# Patient Record
Sex: Male | Born: 1945
Health system: Western US, Academic
[De-identification: ages and names within clinical notes are randomized; demographics above are authoritative.]

---

## 2011-06-05 DIAGNOSIS — C4490 Unspecified malignant neoplasm of skin, unspecified: Secondary | ICD-10-CM

## 2012-08-18 DIAGNOSIS — R002 Palpitations: Secondary | ICD-10-CM

## 2012-08-18 DIAGNOSIS — Z0000 Encounter for general adult medical examination without abnormal findings: Secondary | ICD-10-CM

## 2012-08-23 DIAGNOSIS — I10 Essential (primary) hypertension: Secondary | ICD-10-CM

## 2012-08-23 DIAGNOSIS — R9431 Abnormal electrocardiogram [ECG] [EKG]: Secondary | ICD-10-CM

## 2012-10-19 DEATH — deceased

## 2016-05-25 ENCOUNTER — Ambulatory Visit: Payer: BLUE CROSS/BLUE SHIELD

## 2016-05-28 ENCOUNTER — Ambulatory Visit: Payer: BLUE CROSS/BLUE SHIELD

## 2016-05-29 ENCOUNTER — Ambulatory Visit: Payer: BLUE CROSS/BLUE SHIELD

## 2016-05-29 DIAGNOSIS — N401 Enlarged prostate with lower urinary tract symptoms: Secondary | ICD-10-CM

## 2016-05-29 DIAGNOSIS — Z1211 Encounter for screening for malignant neoplasm of colon: Secondary | ICD-10-CM

## 2016-05-29 DIAGNOSIS — Z79899 Other long term (current) drug therapy: Secondary | ICD-10-CM

## 2016-05-29 DIAGNOSIS — I10 Essential (primary) hypertension: Secondary | ICD-10-CM

## 2016-05-29 DIAGNOSIS — E1165 Type 2 diabetes mellitus with hyperglycemia: Secondary | ICD-10-CM

## 2016-05-29 DIAGNOSIS — Z794 Long term (current) use of insulin: Secondary | ICD-10-CM

## 2016-05-29 DIAGNOSIS — R351 Nocturia: Secondary | ICD-10-CM

## 2016-06-20 DIAGNOSIS — E161 Other hypoglycemia: Secondary | ICD-10-CM

## 2016-07-09 MED ORDER — ONETOUCH ULTRA BLUE VI STRP
ORAL_STRIP | 3 refills | Status: AC
Start: 2016-07-09 — End: 2017-03-10

## 2016-07-09 MED ORDER — SIMVASTATIN 20 MG PO TABS
ORAL_TABLET | 0 refills | Status: AC
Start: 2016-07-09 — End: 2016-10-06

## 2016-07-20 MED ORDER — INSULIN GLARGINE 100 UNIT/ML SC SOLN
0 refills | Status: AC
Start: 2016-07-20 — End: 2017-07-05

## 2016-08-18 MED ORDER — LANTUS 100 UNIT/ML SC SOLN
0 refills
Start: 2016-08-18 — End: ?

## 2016-08-28 MED ORDER — BD INSULIN SYRINGE U/F 31G X 5/16'' 0.3 ML MISC
3 refills | Status: AC
Start: 2016-08-28 — End: 2016-09-15

## 2016-08-28 MED ORDER — ONETOUCH ULTRA BLUE VI STRP
ORAL_STRIP | 3 refills | Status: AC
Start: 2016-08-28 — End: 2017-03-10

## 2016-09-10 MED ORDER — BD INSULIN SYRINGE U/F 31G X 5/16'' 0.3 ML MISC
3 refills
Start: 2016-09-10 — End: ?

## 2016-09-14 ENCOUNTER — Telehealth: Payer: BLUE CROSS/BLUE SHIELD

## 2016-09-14 DIAGNOSIS — Z0000 Encounter for general adult medical examination without abnormal findings: Secondary | ICD-10-CM

## 2016-09-14 DIAGNOSIS — E1065 Type 1 diabetes mellitus with hyperglycemia: Secondary | ICD-10-CM

## 2016-09-15 MED ORDER — INSULIN SYRINGE-NEEDLE U-100 31G X 5/16'' 0.3 ML MISC
1 | Freq: Every day | SUBCUTANEOUS | 3 refills | Status: AC
Start: 2016-09-15 — End: 2017-10-09

## 2016-10-05 MED ORDER — ONETOUCH ULTRA BLUE VI STRP
ORAL_STRIP | 3 refills | Status: AC
Start: 2016-10-05 — End: 2017-12-24

## 2016-10-05 MED ORDER — SIMVASTATIN 20 MG PO TABS
ORAL_TABLET | 0 refills | Status: AC
Start: 2016-10-05 — End: 2017-01-05

## 2016-10-09 IMAGING — CR DX Chest PA Lateral
2 series · 2 of 2 positions shown · non-contrast
Comparison: none

Chest
CLINICAL HISTORY: Left-sided numbness and pain. Shortness of breath.

[chest pa]
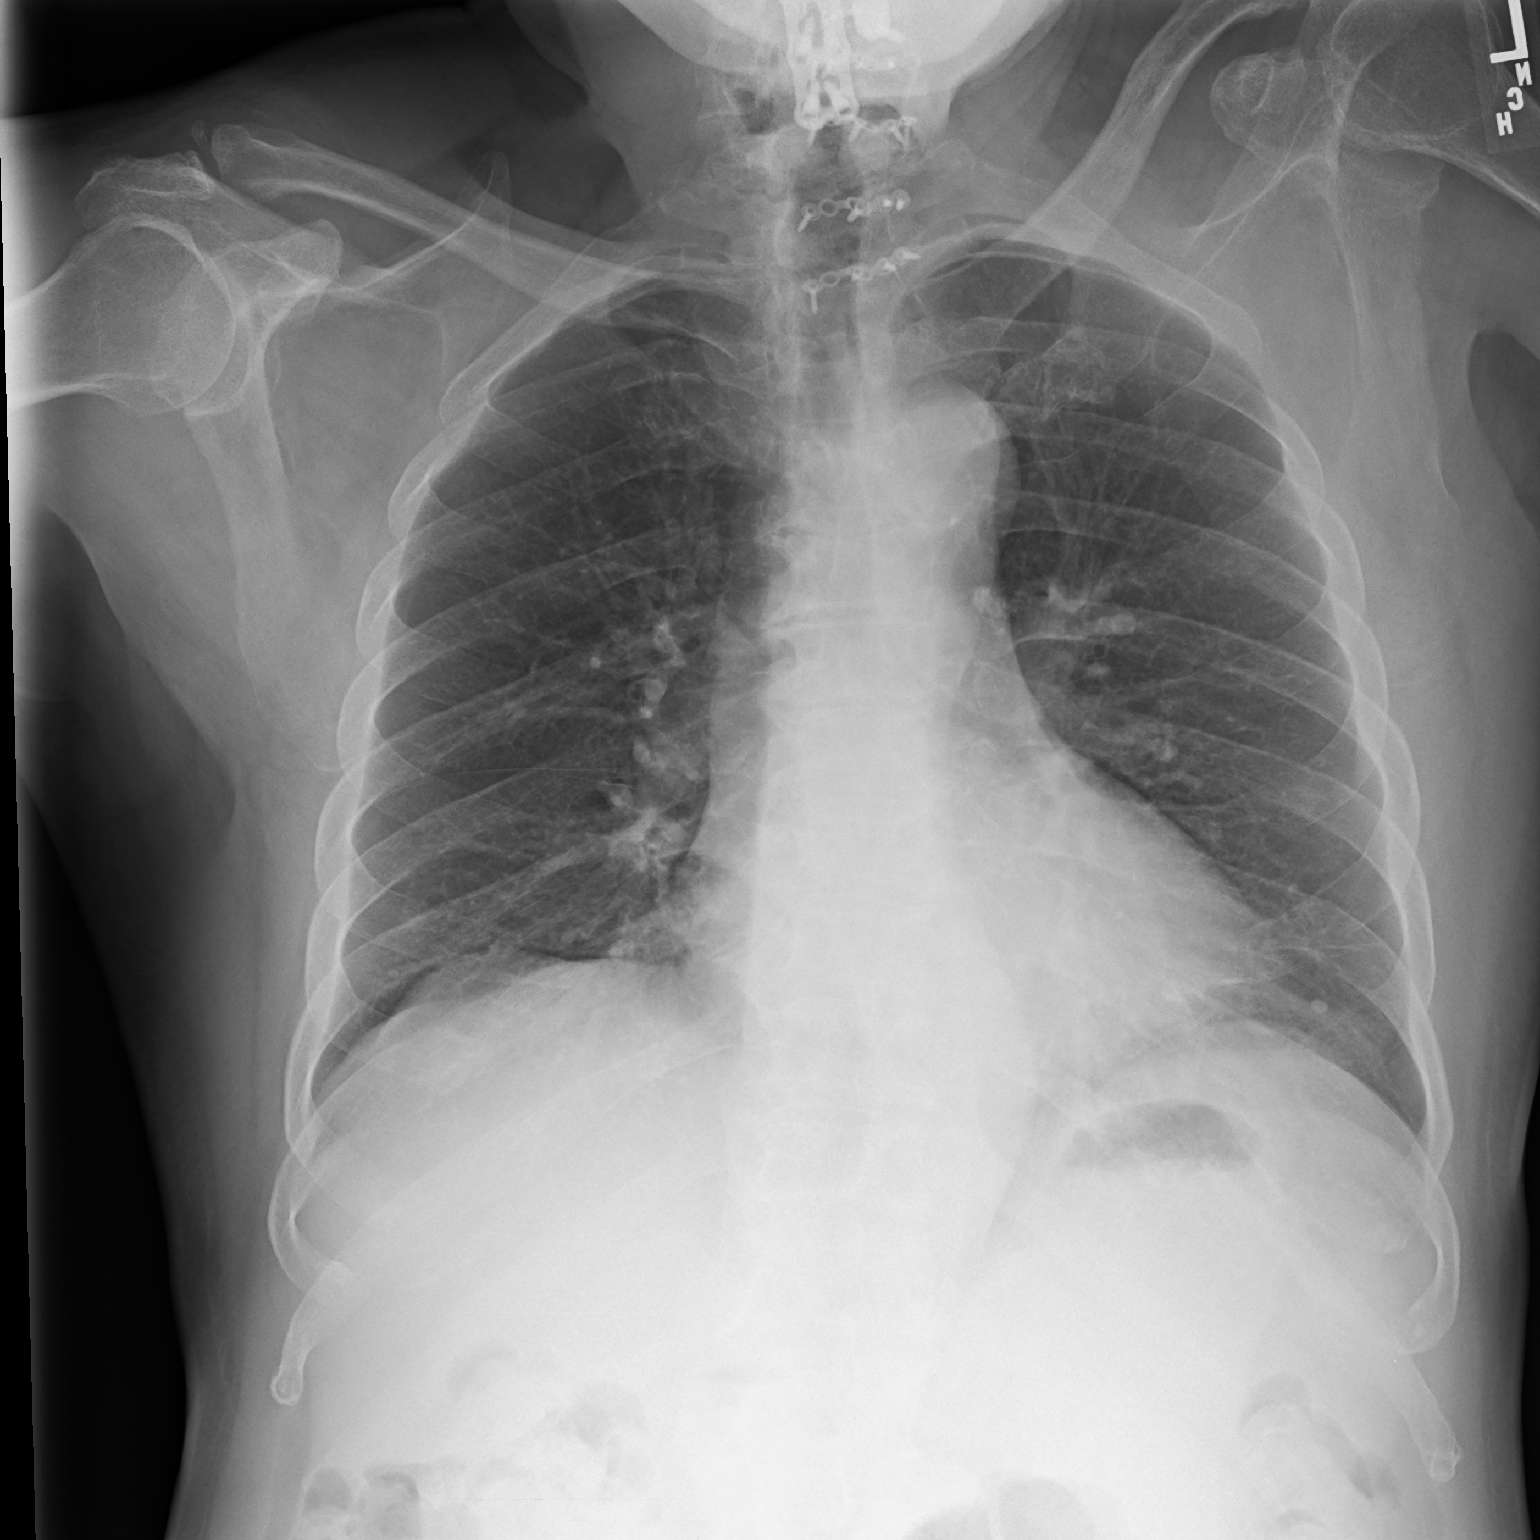

[chest lat]
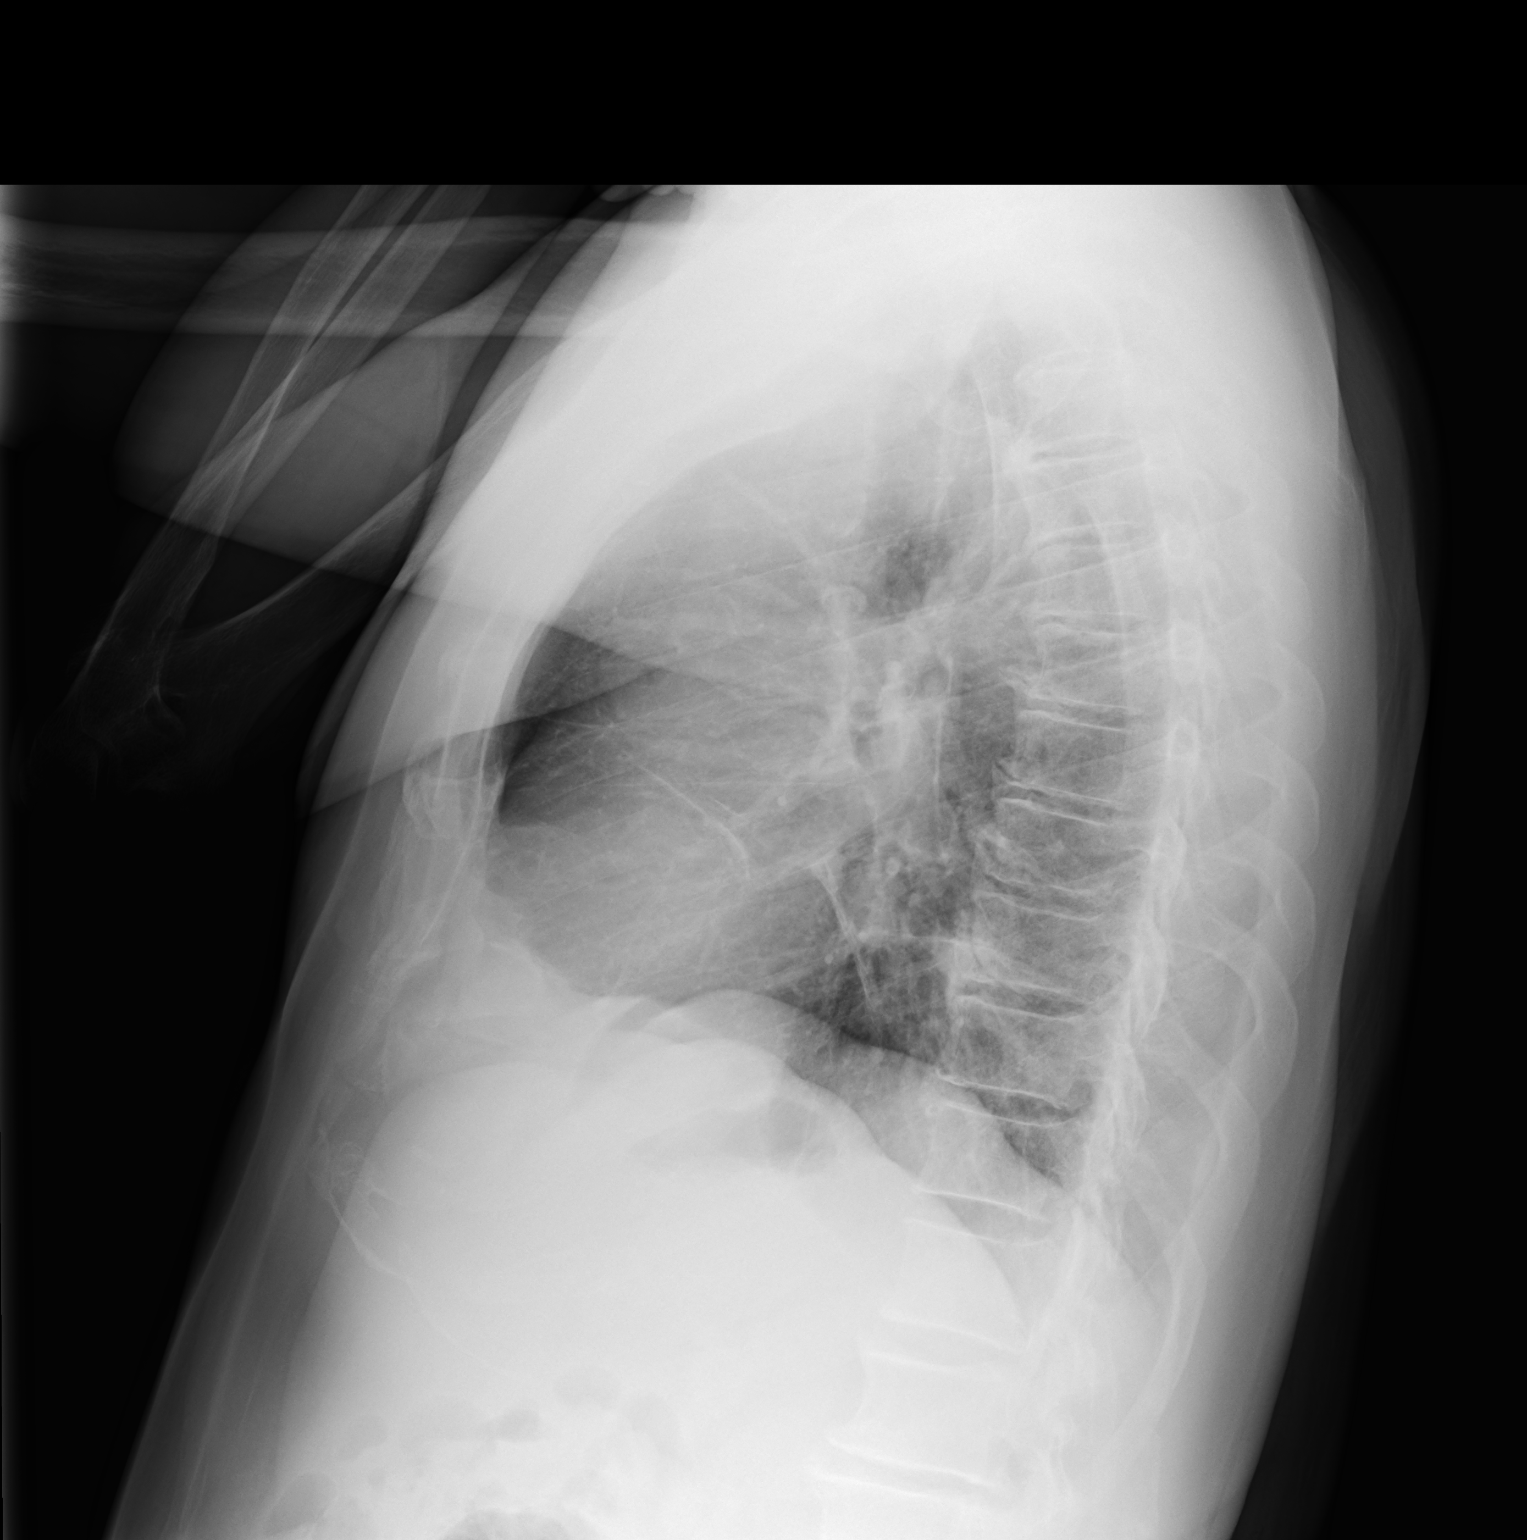

[2 of 2 positions shown; findings below may reference images not displayed]

FINDINGS: Anterior posterior plate and screw fixation lower cervical upper thoracic       
 spine.                                                                                    
 Heart size mildly enlarged. Pulmonary vascularity mediastinum within normal limits.       
 Calcified atherosclerotic disease aorta with mild tortuosity. Calcified node              
 aortopulmonary window.                                                                    
 Mild elevation and eventration right hemidiaphragm.                                       
 No airspace disease, effusions or pneumothorax.                                           
 Atelectasis/scarring left base. Probable left basilar granuloma.                          
 Degenerative changes spine and shoulders.                                                 
 IMPRESSIONS:                                                                              
 1. Mild cardiomegaly.                                                                     
 2. No acute disease.

## 2016-11-18 MED ORDER — SILDENAFIL CITRATE 100 MG PO TABS
100 mg | ORAL_TABLET | ORAL | 4 refills | Status: AC | PRN
Start: 2016-11-18 — End: 2018-01-28

## 2016-12-24 ENCOUNTER — Ambulatory Visit: Payer: BLUE CROSS/BLUE SHIELD

## 2016-12-24 DIAGNOSIS — R252 Cramp and spasm: Secondary | ICD-10-CM

## 2016-12-24 DIAGNOSIS — E1165 Type 2 diabetes mellitus with hyperglycemia: Secondary | ICD-10-CM

## 2016-12-24 DIAGNOSIS — R05 Cough: Secondary | ICD-10-CM

## 2016-12-25 LAB — Basic Metabolic Panel
CALCIUM: 9.6 mg/dL (ref 8.6–10.4)
TOTAL CO2: 24 mmol/L (ref 20–30)

## 2016-12-25 LAB — CK, Total: CREATINE PHOSPHOKINASE, TOTAL: 369 U/L (ref 63–473)

## 2016-12-25 LAB — Hgb A1c: HGB A1C - HPLC: 9.1 — ABNORMAL HIGH (ref <5.7)

## 2016-12-25 LAB — Hepatic Funct Panel
BILIRUBIN,CONJUGATED: 0.2 mg/dL (ref <=0.3)
TOTAL PROTEIN: 7.8 g/dL (ref 6.1–8.2)

## 2016-12-25 LAB — Lipase: LIPASE: 16 U/L (ref 9–63)

## 2016-12-25 LAB — Phosphorus: PHOSPHORUS: 3.4 mg/dL (ref 2.3–4.4)

## 2016-12-25 LAB — Extra Lavender Top

## 2016-12-25 LAB — Magnesium: MAGNESIUM: 1.6 meq/L (ref 1.4–1.9)

## 2016-12-25 LAB — Amylase: AMYLASE: 92 U/L (ref 31–124)

## 2016-12-30 MED ORDER — LISINOPRIL 20 MG PO TABS
ORAL_TABLET | 3 refills | Status: AC
Start: 2016-12-30 — End: 2017-11-05

## 2017-01-04 ENCOUNTER — Ambulatory Visit: Payer: BLUE CROSS/BLUE SHIELD

## 2017-01-04 ENCOUNTER — Telehealth: Payer: BLUE CROSS/BLUE SHIELD

## 2017-01-04 ENCOUNTER — Inpatient Hospital Stay: Payer: BLUE CROSS/BLUE SHIELD

## 2017-01-04 DIAGNOSIS — S99922A Unspecified injury of left foot, initial encounter: Secondary | ICD-10-CM

## 2017-01-04 MED ORDER — SIMVASTATIN 20 MG PO TABS
ORAL_TABLET | 0 refills | Status: AC
Start: 2017-01-04 — End: 2017-03-10

## 2017-01-04 NOTE — Telephone Encounter
Patient would like to know when he should return to work and if any medication will be sent to the pharmacy. Please advise. Patient was seen today.

## 2017-01-04 NOTE — Telephone Encounter
Per Dr. Meda Coffee, patient can take advil or Tylenol. In regards to returning back to work, Dr. Meda Coffee stated he wil wait for the xr results.

## 2017-01-05 DIAGNOSIS — S82402A Unspecified fracture of shaft of left fibula, initial encounter for closed fracture: Secondary | ICD-10-CM

## 2017-01-07 ENCOUNTER — Inpatient Hospital Stay: Payer: BLUE CROSS/BLUE SHIELD | Attending: Orthopaedic Surgery of the Spine

## 2017-01-07 ENCOUNTER — Ambulatory Visit: Payer: BLUE CROSS/BLUE SHIELD | Attending: Orthopaedic Surgery of the Spine

## 2017-01-07 DIAGNOSIS — M25572 Pain in left ankle and joints of left foot: Secondary | ICD-10-CM

## 2017-01-07 NOTE — Progress Notes
Encounter Diagnoses   Name Primary?    Injury of left foot, initial encounter Yes    Closed fracture of shaft of left fibula, unspecified fracture morphology, initial encounter

## 2017-01-07 NOTE — Consults
Chief Complaint: left ankle pain    History of Present Illness:    Alpha Mysliwiec is a very pleasant 71 y.o. patient who presents with a 1 week history of left ankle pain. He was on a ladder and fell about 4 feel and felt immediate pain. He presented to clinic today without any assistance ambulating. He has minimal bruising and swelling.      Past Medical History: No past medical history on file.      Medications:   Outpatient Medications Prior to Visit   Medication Sig   ??? CIALIS 5 MG tablet TAKE 1 TABLET (5 MG TOTAL) BY MOUTH DAILY.   ??? ciprofloxacin 0.3% ophthalmic solution 1 drop qid both eyes.   ??? glucose blood test strip Use as instructed   ??? insulin glargine (LANTUS) 100 units/mL injection vial INJECT 0.2 MLS (20 UNITS TOTAL) UNDER THE SKIN AT BEDTIME.Marland Kitchen   ??? insulin lispro, Humalog, (HUMALOG) 100 unit/mL injection vial 6 units tid before meals.   ??? insulin syringe needle U-100 (BD INSULIN SYRINGE ULTRAFINE) 31G X 5/16'' 0.3 mL syringe Inject 1 each under the skin five (5) times daily.   ??? lisinopril 20 mg tablet TAKE 1 TABLET (20 MG TOTAL) BY MOUTH DAILY..   ??? LISINOPRIL 20 mg tablet TAKE 1 TABLET (20 MG TOTAL) BY MOUTH DAILY..   ??? ONETOUCH ULTRA BLUE test strip USE 1 TEST STRIP TESTING 5 TIMES A DAY.   ??? ONETOUCH ULTRA BLUE test strip USE 1 TEST STRIP TESTING 5 TIMES A DAY.   ??? ONETOUCH ULTRA BLUE test strip USE 1 TEST STRIP TESTING 5 TIMES A DAY.   ??? SILDENAFIL 100 mg tablet TAKE 1 TABLET (100 MG TOTAL) BY MOUTH AS NEEDED FOR FOR ERECTILE DYSFUNCTION.   ??? simvastatin (ZOCOR) 20 mg tablet TOME UNA TABLETA DIARIAMENTE   ??? SIMVASTATIN 20 mg tablet TAKE 1 TABLET (20 MG TOTAL) BY MOUTH AT BEDTIME.   ??? SIMVASTATIN 20 mg tablet TOME UNA TABLETA TODOS LOS DIAS AL ACOSTARSE   ??? VIAGRA 100 MG tablet TAKE AS DIRECTED   ??? VIAGRA 100 MG tablet TAKE AS DIRECTED   ??? VIAGRA 100 MG tablet TAKE AS DIRECTED   ??? VIAGRA 100 MG tablet TAKE 1 TABLET (100 MG TOTAL) BY MOUTH AS NEEDED FOR FOR ERECTILE DYSFUNCTION. No facility-administered medications prior to visit.          Review of Systems: Non Contributory    Physical Exam:    GENERAL: Well-developed, well-nourished.  Appropriate affect and orientation.    GAIT: Antalgic gait.  Deferred heel and toe walking.    HINDFOOT STRUCTURE: Normal heel position.    ARCH STRUCTURE: Symmetric arch structure.    FOREFOOT STRUCTURE: Normal forefoot.    RANGE OF MOTION: Full and symmetric ankle, hindfoot and toe range of motion     NEUROVASCULAR: Intact pulses and sensation.    MOTOR: Intact strength.    TENDERNESS: Tenderness to palpation over fracture site fibula.  No midfoot or proximal fibular tenderness.      RADIOGRAPHS:    XR ANKLE AP LAT OBL LEFT 3V, XR FOOT AP LAT OBL LEFT 3V  ???  CLINICAL HISTORY: ankle pain.  ???  COMPARISON: None.  ???  ???  IMPRESSION:  ???  Ankle: There is a minimally displaced Weber B fracture of the distal fibula shaft. The ankle mortise is intact. There is soft tissue swelling, with a joint effusion.  ???  Foot: Normal study. There is no acute fracture.  Joint spaces are maintained.    ASSESSMENT:  Minimally displaced Weber B fracture of the left distal fibula shaft with stable mortise on weightbearing.    PLAN:    We discussed non operative management. He will be placed in a boot. He will obtain a new x ray of the left ankle at 8 weeks postinjury      Signature     Scribe Attestation  I, Lamonte Sakai, was the scribe for patient Chad Watkins  on 7:23 AM 01/07/2017 in the presence of Dr. Moise Boring. Kamran Coker.    Physician Signature:  I, Dr. Michela Pitcher, personally performed the services described in this documentation, as scribed by Lamonte Sakai in my presence, and it is both accurate and complete.    Moise Boring. Aliveah Gallant, MD

## 2017-01-07 NOTE — Progress Notes
DATE OF SERVICE:  01/04/2017    ATTENDING PHYSICIAN:  Chere Babson G. Jissell Trafton, MD [phone])      SUBJECTIVE:  Patient here after a fall yesterday. As a result, he has painful foot and painful ankle.    OBJECTIVE:  There is mild swelling of the ankle in the dorsum of the foot. No vascular insufficiency, no neuro insufficiency.    PLAN:  X-ray of the foot and the ankle and refer to Ortho.    ASSESSMENT:  Injury of left foot, initial encounter. Closed fracture of the shaft of the left fibula, unspecified fracture, initial encounter.      Turon Kilmer G. Meda Coffee, MD [phone])        [facility] CONF#: [id number]  D: 01/07/2017 06:31:38 T: 01/07/2017 08:48:58 DOCUMENT: [id number]

## 2017-01-08 MED ORDER — HUMALOG 100 UNIT/ML SC SOLN
0 refills
Start: 2017-01-08 — End: ?

## 2017-01-11 DIAGNOSIS — R1013 Epigastric pain: Secondary | ICD-10-CM

## 2017-01-12 NOTE — Progress Notes
Encounter Diagnoses   Name Primary?    Abdominal discomfort, epigastric Yes    Cough     Cramp of extremity     Diabetes mellitus type 2, uncontrolled, without complications ([facility])

## 2017-01-13 NOTE — Progress Notes
DATE OF SERVICE:  12/24/2016     SUBJECTIVE:  Patient here with multiple problems, #1 cough. The patient has been coughing, but nonproductive cough, with no fever and no chills. He has been having epigastric pain and nausea, especially before eating or after eating, not related to any special food. No hematemesis, no melena. Also been having some cramps of the lower extremity, and he wants to check on his diabetes. Otherwise systemic review, including 14-point review of systems, all unremarkable.    OBJECTIVE:  Vital Signs: Blood pressure is 127/64. Neck: No JVD. No carotid bruit. Lungs: Clear. Heart: Regular sinus rhythm. Abdomen: Soft. No tenderness. No rebound.    PLAN:  Patient will have blood chemistry, serum lipase. Can use Protonix, or Prilosec, or Zantac over the counter for his cough. We will consider lisinopril as a cause of the cough or viral infection. Observation, reassurance, and we will follow up his diabetes.    ASSESSMENT:  1. Abdominal discomfort epigastric.  2. Cough.  3. Cramp of lower extremities.  4. Diabetes mellitus type 2, under control, without mild complication.      Ivory Maduro G. Meda Coffee, MD [phone])        [facility] CONF#: [id number]  D: 01/13/2017 03:47:31 T: 01/13/2017 04:07:58 DOCUMENT: [id number]

## 2017-01-14 ENCOUNTER — Telehealth: Payer: BLUE CROSS/BLUE SHIELD

## 2017-01-14 NOTE — Telephone Encounter
EDD (disability) paperwork was drop off by patient's daughter Wells Guiles. Informed paperwork will be placed at Dr. Meda Coffee' inbox. Patient will be notify once completed.

## 2017-01-22 ENCOUNTER — Ambulatory Visit: Payer: BLUE CROSS/BLUE SHIELD

## 2017-01-23 DIAGNOSIS — S82402D Unspecified fracture of shaft of left fibula, subsequent encounter for closed fracture with routine healing: Secondary | ICD-10-CM

## 2017-01-23 NOTE — Progress Notes
DATE OF SERVICE:  01/22/2017     CHIEF COMPLAINT:  Patient here for followup on fracture, left fibula. He has an appointment in 4 weeks with his orthopedist. Patient here to fill the papers for disability and for EDD. Patient has no complaints.    OBJECTIVE:  The left leg is not swollen. No edema. No sign of DVT.    PLAN:  We did discuss the above. We did discuss deep vein thrombosis prevention. Patient was advised to go to the emergency room if there is any shortness of breath. We did discuss deep vein thrombosis. His wife was present. Also, did discuss patient to be on calcium and vitamin D3, consider physical therapy after the healing, and consider bone density scan. We did discuss peripheral neuropathy, osteoporosis, osteopenia, and gait imbalance, although the patient did injure himself while working in the yard.    ASSESSMENT:  1. Closed fracture of shaft of left fibula.  Routine healing.   2. Possible osteopenia or osteoporosis.      Deniese Oberry G. Meda Coffee, MD [phone])        [facility] CONF#: [id number]  D: 01/23/2017 07:22:44 T: 01/23/2017 07:31:50 DOCUMENT: [id number]

## 2017-01-23 NOTE — Progress Notes
Encounter Diagnosis   Name Primary?    Closed fracture of shaft of left fibula with routine healing, unspecified fracture morphology, subsequent encounter Yes

## 2017-01-25 NOTE — Telephone Encounter
Work form from [employer] has been completed and patient informed. Form will be at the front desk.

## 2017-03-09 ENCOUNTER — Inpatient Hospital Stay: Payer: BLUE CROSS/BLUE SHIELD | Attending: Orthopaedic Surgery of the Spine

## 2017-03-09 ENCOUNTER — Ambulatory Visit: Payer: BLUE CROSS/BLUE SHIELD | Attending: Orthopaedic Surgery of the Spine

## 2017-03-09 DIAGNOSIS — M25572 Pain in left ankle and joints of left foot: Secondary | ICD-10-CM

## 2017-03-09 NOTE — Progress Notes
History:     Chad Watkins returns for follow-up for 2 month history of left ankle pain. He was on a ladder and fell about 4 feet and felt immediate pain. He has been in a boot since his last visit.    Physical Examination:    Minimal swelling and TTP over fracture site.  Good retained ankle range of motion    Radiographs:    Three views of the left ankle demonstrate progressively healing fracture with callus formation, stable mortise.    Assessment:     Minimally displaced Weber B fracture of the left distal fibula shaft with stable mortise on weightbearing.    Plan:    I wrote for physical therapy and a brace. He can wean the boot and advance activity as tolerated. Follow up in 1 month with x rays of the left ankle.      Signature     Scribe Attestation  I, Lamonte Sakai, was the scribe for patient Chad Watkins  on 7:43 AM 03/09/2017 in the presence of Dr. Moise Boring. Maddyx Wieck.    Physician Signature:  I, Dr. Michela Pitcher, personally performed the services described in this documentation, as scribed by Lamonte Sakai in my presence, and it is both accurate and complete.    Moise Boring. Caedence Snowden, MD

## 2017-03-30 ENCOUNTER — Ambulatory Visit: Payer: BLUE CROSS/BLUE SHIELD

## 2017-04-01 ENCOUNTER — Ambulatory Visit: Payer: BLUE CROSS/BLUE SHIELD

## 2017-04-05 DIAGNOSIS — M25572 Pain in left ankle and joints of left foot: Secondary | ICD-10-CM

## 2017-04-05 DIAGNOSIS — G8929 Other chronic pain: Secondary | ICD-10-CM

## 2017-04-05 MED ORDER — SIMVASTATIN 20 MG PO TABS
ORAL_TABLET | 0 refills | Status: AC
Start: 2017-04-05 — End: 2017-06-30

## 2017-04-05 NOTE — Progress Notes
DATE OF SERVICE:  04/01/2017     SUBJECTIVE:  Patient here for chronic pain of left ankle, unable to walk normally or stand on his feet for a long time. The patient has been under the care of orthopedist for open reduction and internal fixation from the fracture of the ankle. The patient wants to fill his paper for extended disability.    OBJECTIVE:  The left ankle is mildly swollen. No sign of phlebitis.    PLAN:  The patient will extend his physical disability and we will fill the paper after he sees the orthopedist and he will he will be seen soon. The patient will extend his disability beyond the 04/06/2017 recommended by the orthopedist.    ASSESSMENT:  1. Chronic left ankle pain.   2. Status post fracture left ankle.      Renae Mottley G. Meda Coffee, MD [phone])        [facility] CONF#: [id number]  D: 04/05/2017 09:44:40 T: 04/05/2017 09:57:51 DOCUMENT: [id number]

## 2017-04-05 NOTE — Progress Notes
Encounter Diagnosis   Name Primary?    Chronic pain of left ankle Yes

## 2017-04-06 ENCOUNTER — Inpatient Hospital Stay: Payer: BLUE CROSS/BLUE SHIELD | Attending: Orthopaedic Surgery of the Spine

## 2017-04-06 ENCOUNTER — Ambulatory Visit: Payer: BLUE CROSS/BLUE SHIELD | Attending: Orthopaedic Surgery of the Spine

## 2017-04-06 DIAGNOSIS — S82892S Other fracture of left lower leg, sequela: Secondary | ICD-10-CM

## 2017-04-06 DIAGNOSIS — M25572 Pain in left ankle and joints of left foot: Secondary | ICD-10-CM

## 2017-04-06 NOTE — Progress Notes
History:     Braylon Grenda returns for follow-up for 3 monthhistory of left anklepain. He was on a ladder and fell about 4 feet and felt immediate pain. He has been in a normal shoe since his last visit.    Physical Examination:    Minimal swelling and TTP over fracture site.  Good retained ankle range of motion    Radiographs:    Three views of the left ankle demonstrate progressively healing fracture with callus formation, stable mortise.    Assessment:     Minimally displaced Weber B fracture of the left distal fibula shaft with stable mortise on weightbearing.    Plan:    Advance activity as tolerated. Follow up as needed.       Signature     Scribe Attestation  I, Sterling Big, was the scribe for patient Nakota Elsen  on 5:00 PM 04/05/2017 in the presence of Dr. Assunta Gambles. [place].    Physician Signature:  I, Dr. Tish Frederickson, personally performed the services described in this documentation, as scribed by Sterling Big in my presence, and it is both accurate and complete.    Assunta Gambles. [place], MD***

## 2017-04-08 NOTE — Progress Notes
PHYSICAL THERAPY EVALUATION     PATIENT: Chad Watkins  GENDER: male  AGE: 72 y.o.  DOB: [birth year]  MRN: [id number]    REFERRING PHYSICIAN: Isabel Caprice., MD    DATE OF ONSET: 01/03/17    DIAGNOSIS:  Encounter Diagnoses   Name Primary?   ??? Acute left ankle pain        Subjective:   CHIEF COMPLAINT:  Chief Complaint   Patient presents with   ??? Pain       HISTORY OF PRESENT ILLNESS / INJURY: InterpretorEnid Derry [phone] used:   Patient is a 72 year-old right/left handed male, referred to physical therapy for with complaints of left ankle pain, secondary to injury when patient fell on a ladder in January 03, 2017 resulting in a closed fracture of the left distal fibula shaft. Patient wore a CAM boot for 1.5 week and wore a brace for 1 month afterward and is now currently walking without support as of a few days go. Patient experiences pain at the end of the day with ambulation. Patient is able to walk for 1 block with some discomfort but no pain. Increased discomfort with stairs and experiences some swelling. He lives in a 2 story home and has to use the stairs (15 steps) on a daily basis. Some discomfort with sleeping as he cant place his foot over the other foot.      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 Patient is able to walk for 1 block with some discomfort but no pain. Increased discomfort with stairs and experiences some swelling. He lives in a 2 story home and has to use the stairs (15 steps)  on a daily basis. Some discomfort with sleeping as he cant place his foot over the other foot.    PAIN:   Location: Left Ankle (medial and lateral) and at arch  Duration: Variable  Description: unable to describe   Current Pain: 4/10  Pain at Best: 0/10 Pain at Worst: 6/10  Aggravating Factors: walking, stairs, standing  Alleviating Factors: Heat      PRIOR INTERVENTIONS:  None    BARRIERS TO LEARNING:  Are there any cultural or religious beliefs limiting treatment?: No      Are there any barriers to learning?: No     Are there any special communication needs?: Yes  If yes, comments:: needs Spanish translator            History:   PROBLEM SUMMARY LIST:   Patient Active Problem List   Diagnosis   ??? Bursitis   ??? Skin cancer   ??? Hyperlipidemia   ??? Palpitation   ??? Need for vaccination   ??? Type 2 diabetes mellitus (HCC/RAF)   ??? Visit for preventive health examination   ??? Hypertension, well controlled   ??? Abnormal EKG   ??? Diabetes mellitus, insulin dependent (IDDM), uncontrolled (HCC/RAF)   ??? Diabetes mellitus type I (HCC/RAF)   ??? Left ankle pain       SURGICAL HISTORY:  No past surgical history on file.     PERTINENT DIAGNOSTIC TESTS: 04/06/17 XR ankle: ''IMPRESSION: The Weber B fracture shows stable near-anatomic alignment. There is progressive  healing. The ankle mortise is normal.''    01/04/17 XR ankle: ''IMPRESSION:  ???  Ankle: There is a minimally displaced Weber B fracture of the distal fibula shaft. The ankle mortise is intact. There is soft tissue swelling, with a joint effusion.  ???  Foot: Normal study. There is no acute fracture. Joint spaces are maintained.''???  ???    MEDICATIONS:   Outpatient Medications Prior to Visit   Medication Sig   ??? acetaminophen-codeine (TYLENOL #3) 300-30 mg tablet TAKE 1 TABLET EVERY 4 HOURS AS NEEDED FOR PAIN   ??? CIALIS 5 MG tablet TAKE 1 TABLET (5 MG TOTAL) BY MOUTH DAILY.   ??? ciprofloxacin 0.3% ophthalmic solution 1 drop qid both eyes.   ??? glucose blood test strip Use as instructed   ??? insulin glargine (LANTUS) 100 units/mL injection vial INJECT 0.2 MLS (20 UNITS TOTAL) UNDER THE SKIN AT BEDTIME.Marland Kitchen   ??? insulin lispro, Humalog, (HUMALOG) 100 unit/mL injection vial 6 units tid before meals. ??? insulin syringe needle U-100 (BD INSULIN SYRINGE ULTRAFINE) 31G X 5/16'' 0.3 mL syringe Inject 1 each under the skin five (5) times daily.   ??? LISINOPRIL 20 mg tablet TAKE 1 TABLET (20 MG TOTAL) BY MOUTH DAILY..   ??? ONETOUCH ULTRA BLUE test strip USE 1 TEST STRIP TESTING 5 TIMES A DAY.   ??? penicillin V potassium 500 mg tablet TAKE 1 TABLET 4 TIMES ADAY UNTIL GONE   ??? SILDENAFIL 100 mg tablet TAKE 1 TABLET (100 MG TOTAL) BY MOUTH AS NEEDED FOR FOR ERECTILE DYSFUNCTION.   ??? simvastatin (ZOCOR) 20 mg tablet TOME UNA TABLETA DIARIAMENTE   ??? SIMVASTATIN 20 mg tablet TOME UNA TABLETA TODOS LOS DIAS AL ACOSTARSE   ??? VIAGRA 100 MG tablet TAKE AS DIRECTED   ??? VIAGRA 100 MG tablet TAKE 1 TABLET (100 MG TOTAL) BY MOUTH AS NEEDED FOR FOR ERECTILE DYSFUNCTION.     No facility-administered medications prior to visit.         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[employer], advertising    LIVING SITUATION:  Lives with another/others who can provide assistance - 2 story home with 15 steps    PATIENT OWNED EQUIPMENT:  None    Objective:     POSTURE/OBSERVATION - sway back posture  Cervical: forward head with rounded shoulders  Lumbar: lordosis  Pelvis: posterior pelvic tilt  Femur: slight medial rotation  Knee: slight knee flexion  Ankle/Foot: increased swelling in left ankle with weight shifted forward and more on right    FUNCTIONAL MOVEMENT ANALYSIS:  Sit-stand: increased weightshift to the right with slight femoral adduction    Single limb stance:   - Right: Ipsilateral trunk lean with femoral internal rotation - 14 seconds with increased postural sway - Left: compensates with lateral trunk lean - 0 seconds     Gait Analysis: Antalgic gait with decreased stance time on his left. Decreased heel rise and hip extension during terminal stance.  Increased pronation during stance phase.  Squat: Increased pain - quadriceps dominant and hip adduction       SITTING:  Active knee extension - posterior pelvic tilt     ACTIVE RANGE OF MOTION:  Ankle    Ankle plantarflexion (45???): Right: 35???, Left: 30???    Ankle dorsiflexion (15???): Right: 0???, Left: -5???    Ankle inversion (35???): Right: 30???, Left: 25???    Ankle eversion (25???): Right: 15???, Left: 10???    Knee    Knee extension (0???): Right: 0???, Left: 0???    Hip    Hip flexion (125???): Right: 100???, Left: 100???      MANUAL MUSCLE TESTING:  Gluteus Maximus (L5, S1):  Right: 4-/5, Left: 4-/5  Gluteus Medius (L4, L5):  Right: 4/5, Left: 4/5  Hip flexors (L1, L2):  Right: 4/5, Left: 4/5  Quadriceps (L3, L4): Right: 5/5, Left: 5/5  Hamstrings (S1): Right: 5/5, Left: 5/5  Tibialis anterior (L4, L5): Right: 5/5, Left: 4+/5  Tibialis posterior (L4, L5): Right: 5/5, Left: 4+/5  Peroneals (L5, S1): Right: 5/5, Left: 4+/5  Gastrocnemius/soleus (S1, S2): Right: 4/5, Left: 3+/5    PALPATION:  anterior talofibular ligament, anterior tibiofibular syndesmosis, calcaneofibular ligament, peroneus brevis, peroneus longus and posterior talofibular ligament    JOINT ACCESSORY MOTION: deferred testing secondary to status post fracture       PROBLEM LIST:  decreased ankle range of motion, limited standing tolerance, limited walking tolerance, pain limiting function and limited work/activity participation  pain, poor posture, gait deviations, poor balance, decreased pain-free range of motion, decreased joint accessory motion, poor pelvic/trunk stability and strength,  decreased muscle flexibility, muscle atrophy, myofascial restrictions, connective tissue mobility deficits, tenderness to palpation, limited tolerance to daily and self-care activities, and lack of patient knowledge in home exercise program        Outcome Measure(s):       LEFS Score % Disability: 44.74                                             GOALS:    SHORT TERM GOALS: 6 weeks  Patient will increase ankle range of motion by 5 degrees in dorsiflexion and plantarflexion to reduce gait deviations and improve pain management during ambulation.    Patient will improve edema management and pain level to 2/10 in order to increase standing and walking tolerance to 60 minutes.    Patient will be independent in management of pain with home program including posture correction, use of support, exercise, application of cold pack, application of heat pack and pacing of activities.      LONG TERM GOALS: 16 weeks  Patient will increase standing tolerance to 120 minutes to be able to return to work.    Patient will increase walking tolerance to 120 minutes to be able to return to work.    Patient will perform full squat to perform bending to reach floor with good joint protection in order to perform work duties        ASSESSMENT:  Patient is a 72 year-old  male, referred to physical therapy for status post closed fracture of the left distal fibula shaft when patient fell on a  ladder on January 03, 2017. The patient???s primary impairment(s) include decreased ankle range of motion and strength, poor single leg balance, poor ankle strength and stability, impaired gait mechanics, poor postural awareness and control which is limiting  their ability to walk household/community distances, ascend/descend stairs, and complete work activities. The patient requires skilled therapy that can be safely and effectively performed only by a qualified therapist to address the above deficits.      REHAB POTENTIAL: Good    OBSTACLES TO REHABILITATION/CO-MORBIDITIES THAT MAY AFFECT GOALS AND TREATMENT PLAN:   1. Co-morbidities: age, hypertension, diabetes  2. Personal Factors: none  3. Clinical Presentation:  Stable 4. Clinical Decision Making: Low      Plan:   INTERVENTIONS:   Therapeutic Exercise, Manual Therapy, Neuromuscular Re-education, Therapeutic Activities, Group Therapy, Ultrasound, Electrical Stimulation, Iontophoresis, Heat Therapy, Cold Therapy, Patient/Caregiver Education, Gait Training, Biofeedback and Light Therapy    All portions of the treatment plan can be delegated to the Physical Therapist Assistant with the exception of joint mobilization/manipulation.    Patient to be seen 2 times per week for 12 week(s).    Patient agrees with goals and treatment plan as outlined above:Yes    TREATMENT TODAY:    PPO:    Visit number: 1  Plan of Care Expires: 07/08/2017  Authorization expires:  Medical necessity     Diagnosis:   Encounter Diagnoses   Name Primary?   ??? Acute left ankle pain      Precautions: Weightbearing as tolerated -  status post closed fracture of the left distal fibula shaft - January 03, 2017.    Pain:Yes   Pain In: 4/10  Pain Out: 4/10  Location: Left Ankle (medial and lateral) and at arch  Duration: Variable  Description: unable to describe       SUBJECTIVE: Refer to initial evaluation    OBJECTIVE:   - Initial evaluation completed    - Alphabet tracing with left ankle in sitting  - Active seated dorsiflexion in sitting  - Double limb heel raise with upper extremity support    - Patient education regarding placing ice on ankle multiple times a day secondary to increased inflammation    - Ice left ankle supine x10 minutes    Patient Education:   Education Content: Home exercise program as above; patient education regarding plan of care   Education Delivery Method:   verbal, written and demonstration   Education Response:   verbalizes understanding and demonstrates understanding    ASSESSMENT: Refer to initial evaluation    PLAN: Posture education, balance training, gait training, lumbopelvic strengthening, ankle range of motion, ankle strengthening, modalities as needed, manual treatment as necessary     PTA Communication: All portions of the treatment plan may be delegated to the PTA with the exception of joint mobilizations.     Patient was seen for a total of 65 minutes of treatment time.  15 minutes was in services represented by timed CPT codes.    [name], PT

## 2017-04-09 ENCOUNTER — Ambulatory Visit: Payer: BLUE CROSS/BLUE SHIELD

## 2017-04-09 DIAGNOSIS — M25572 Pain in left ankle and joints of left foot: Secondary | ICD-10-CM

## 2017-04-15 ENCOUNTER — Ambulatory Visit: Payer: BLUE CROSS/BLUE SHIELD

## 2017-04-15 DIAGNOSIS — M25572 Pain in left ankle and joints of left foot: Secondary | ICD-10-CM

## 2017-04-15 NOTE — Treatment Summary
PHYSICAL THERAPY TREATMENT NOTE    PATIENT: Chad Watkins  GENDER: male  AGE: 72 y.o.  DOB: 1945/04/19  MRN: [id number]    PPO:    Visit number: 2   Plan of Care Expires: 07/08/2017  Authorization expires:  Medical necessity     Diagnosis:   Encounter Diagnoses   Name Primary?   ??? Left ankle pain, unspecified chronicity      Precautions: Weightbearing as tolerated -  status post closed fracture of the left distal fibula shaft - January 03, 2017.    ???  Pain:Yes   Pain In: 4/10  Pain Out: 4/10  Location: Left Ankle (medial and lateral) and at arch  Duration: Variable  Description: swelling    SUBJECTIVE: Patient was able to walk 2 blocks in the morning and 2 blocks in the evening with no pain. Just feels increased swelling    OBJECTIVE:   Ankle range of motion:   Ankle plantarflexion (45???): Left: 30???  ???  Ankle dorsiflexion (15???): Left: 0???    - Retrograde massage to left foot/ankle   - Alphabet tracing with left ankle in long sitting reviewed  - Eccentric left gatronemius strengthening with double heel rise and lowering down with left  // issued as Home Exercise Program   - Gastrocnemius and soleus stretch at the wall  // issued as Home Exercise Program   - Ankle inversion/eversion in sitting with foot placed on towel  // issued as Home Exercise Program   - Tandem stance balance with finger touch hold // issued as Home Exercise Program   ???  - Patient education to continue icing ankle secondary to increased inflammation  ???  - Patient to ice at home      Patient Education:   Education Content: Home exercise program as above   Education Delivery Method:   verbal, written and demonstration   Education Response:   verbalizes understanding and demonstrates understanding    ASSESSMENT: Improved tolerance to standing and walking per patient. Continued with ankle strengthening which patient tolerated without additional discomfort. He required intermittent cueing to prevent compensatory movement from knee and hip. Difficulty with balance requiring upper extremity support.     PLAN: Posture education, balance training, gait training, lumbopelvic strengthening, ankle range of motion, ankle strengthening, modalities as needed, manual treatment as necessary     PTA Communication: All portions of the treatment plan may be delegated to the PTA with the exception of joint mobilizations.     Patient was seen for a total of 25 minutes of treatment time.  25 minutes was in services represented by timed CPT codes.      [name], PT     Home Exercise Program   - Alphabet tracing with left ankle in long sitting   - Eccentric left gatronemius strengthening with double heel rise and lowering down with left   - Gastrocnemius and soleus stretch at the wall   - Active seated dorsiflexion in sitting  - Double limb heel raise with upper extremity support

## 2017-04-26 ENCOUNTER — Ambulatory Visit: Payer: BLUE CROSS/BLUE SHIELD

## 2017-04-30 ENCOUNTER — Telehealth: Payer: BLUE CROSS/BLUE SHIELD

## 2017-04-30 NOTE — Telephone Encounter
Call Back Request    MD:  Dr. Meda Coffee     Reason for call back: Pt saw Dr. Meda Coffee on 04/01/17 and had forms that were supposed to be sent to the [facility] office. Pt's daughter explains that they have not heard from the [facility] and are concerned that the forms were not sent. Pt's daughter would like to know if forms wee submitted.  CB#[phone]    Any Symptoms:  []  Yes  [x]  No      ? If yes, what symptoms are you experiencing:    o Duration of symptoms (how long):    o Have you taken medication for symptoms (OTC or Rx):      Patient or caller has been notified of the 24-48 hour turnaround time.

## 2017-05-03 NOTE — Telephone Encounter
lmom informing that Dr. Meda Coffee is out of the office and he returns tomorrow, the office can provide an update status.

## 2017-05-04 NOTE — Telephone Encounter
Spoke with daughter Wells Guiles and stated she is going to contact [facility] and look for the form. Per daughter, she will give the office a call back.

## 2017-05-04 NOTE — Telephone Encounter
lmom for daughter to call me back directly if any questions. Also if possible to bring in new form and MD will complete.  ar

## 2017-05-05 NOTE — Treatment Summary
R

## 2017-05-05 NOTE — Treatment Summary
PHYSICAL THERAPY TREATMENT NOTE    PATIENT: Chad Watkins  GENDER: male  AGE: 72 y.o.  DOB: 12-18-1945  MRN: [id number]    PPO:    Visit number: 3   Plan of Care Expires: 07/08/2017  Authorization expires:  Medical necessity     Diagnosis:   Encounter Diagnoses   Name Primary?   ??? Left ankle pain, unspecified chronicity        Precautions: Weightbearing as tolerated -  status post closed fracture of the left distal fibula shaft - January 03, 2017.    ???    Pain In: 0/10  Pain Out: 0/10  Location: Left Ankle (medial and lateral) and at arch  Duration: Variable  Description: swelling    SUBJECTIVE: Patient reports walking yesterday for 8 blocks  No pain while at rest but feels swelling when walking.  Patient is limping during gait but without pain..    OBJECTIVE:     Mild edema noted on lateral foot and ankle.    - Retrograde massage to left foot/ankle   - Alphabet tracing with left ankle in short sitting.  Patient uses hip and knee for compensation while tracing alphabet in short sitting.  Continued exercise in long sitting to reduce compensations.     - Ankle plantar flexion and dorsiflexion with [place] resistance band in long sitting.  Did not complete the set of 15 due to complaints of pain. Patient felt pain with especially with plantar flexion.     - Ankle inversion and eversion in short sitting.  Patient reported mild pain/discomfort with inversion/eversion   - Eccentric left gatronemius strengthening with double heel rise and lowering down with left while facing bed.  Patient used two hands for support   - Supported ankle plantar flexion/dorsiflexion on balance board in sitting 15 reps    - patient received ice for 10 minutes         Patient Education:   Education Content: Home exercise program as above   Education Delivery Method:   verbal, written and demonstration   Education Response:   verbalizes understanding and demonstrates understanding ASSESSMENT: Patient complained of pain during ankle plantar flexion and dorsiflexion with resistance band.  Pain was worse with plantarflexion. 6/10 with plantarflexion  He also experienced mild pain and stiffness with inversion and eversion.  The pain he experienced during exercise went away after he stopped doing the exercises.  He required intermittent cueing to prevent compensatory movement from knee and hip while tracing the alphabet. Difficulty with balance while performing heel raises requiring upper extremity support. Required cuing to have his feet facing straight forward.  Poor eccentric control when lowering from heel raises.  Required cuing to control eccentric movement.      PLAN: Posture education, balance training, gait training, lumbopelvic strengthening, ankle range of motion, ankle strengthening, modalities as needed, manual treatment as necessary     PTA Communication: All portions of the treatment plan may be delegated to the PTA with the exception of joint mobilizations.     Patient was seen for a total of 30 minutes of treatment time.  30 minutes was in services represented by timed CPT codes.        Dennard Schaumann SPTA    I have supervised care and agree with the documentation above.    ERMIN Olive Bass, PTA         Home Exercise Program   - Alphabet tracing with left ankle in long sitting   - Eccentric  left gatronemius strengthening with double heel rise and lowering down with left   - Gastrocnemius and soleus stretch at the wall   - Active seated dorsiflexion in sitting  - Double limb heel raise with upper extremity support

## 2017-05-06 ENCOUNTER — Ambulatory Visit: Payer: BLUE CROSS/BLUE SHIELD

## 2017-05-06 DIAGNOSIS — M25572 Pain in left ankle and joints of left foot: Secondary | ICD-10-CM

## 2017-05-11 ENCOUNTER — Ambulatory Visit: Payer: BLUE CROSS/BLUE SHIELD

## 2017-05-11 NOTE — Treatment Summary
PHYSICAL THERAPY TREATMENT NOTE    PATIENT: Chad Watkins  GENDER: male  AGE: 72 y.o.  DOB: [birth year]  MRN: [id number]    PPO:    Visit number: Visit count could not be calculated. Make sure you are using a visit which is associated with an episode.   Plan of Care Expires: 07/08/2017  Authorization expires:  Medical necessity     Diagnosis:   No diagnosis found.    Precautions: Weightbearing as tolerated -  status post closed fracture of the left distal fibula shaft - January 03, 2017.    ???    Pain In: 0/10***  Pain Out: 0/10***  Location: Left Ankle (medial and lateral) and at arch  Duration: Variable  Description: swelling    SUBJECTIVE: *** Patient reports walking yesterday for 8 blocks  No pain while at rest but feels swelling when walking.  Patient is limping during gait but without pain..    OBJECTIVE: ***    Mild edema noted on lateral foot and ankle.    - Retrograde massage to left foot/ankle   - Alphabet tracing with left ankle in short sitting.  Patient uses hip and knee for compensation while tracing alphabet in short sitting.  Continued exercise in long sitting to reduce compensations.     - Ankle plantar flexion and dorsiflexion with [place] resistance band in long sitting.  Did not complete the set of 15 due to complaints of pain. Patient felt pain with especially with plantar flexion.     - Ankle inversion and eversion in short sitting.  Patient reported mild pain/discomfort with inversion/eversion   - Eccentric left gatronemius strengthening with double heel rise and lowering down with left while facing bed.  Patient used two hands for support   - Supported ankle plantar flexion/dorsiflexion on balance board in sitting 15 reps    - patient received ice for 10 minutes         Patient Education:   Education Content: Home exercise program as above   Education Delivery Method:   verbal, written and demonstration   Education Response:   verbalizes understanding and demonstrates understanding ASSESSMENT: *** Patient complained of pain during ankle plantar flexion and dorsiflexion with resistance band.  Pain was worse with plantarflexion. 6/10 with plantarflexion  He also experienced mild pain and stiffness with inversion and eversion.  The pain he experienced during exercise went away after he stopped doing the exercises.  He required intermittent cueing to prevent compensatory movement from knee and hip while tracing the alphabet. Difficulty with balance while performing heel raises requiring upper extremity support. Required cuing to have his feet facing straight forward.  Poor eccentric control when lowering from heel raises.  Required cuing to control eccentric movement.      PLAN: Posture education, balance training, gait training, lumbopelvic strengthening, ankle range of motion, ankle strengthening, modalities as needed, manual treatment as necessary     PTA Communication: All portions of the treatment plan may be delegated to the PTA with the exception of joint mobilizations.     Patient was seen for a total of 30 minutes of treatment time.  30 minutes was in services represented by timed CPT codes.        Dennard Schaumann SPTA    I have supervised care and agree with the documentation above.    ERMIN Olive Bass, PTA         Home Exercise Program   - Alphabet tracing with left ankle in long  sitting   - Eccentric left gatronemius strengthening with double heel rise and lowering down with left   - Gastrocnemius and soleus stretch at the wall   - Active seated dorsiflexion in sitting  - Double limb heel raise with upper extremity support

## 2017-05-17 DIAGNOSIS — G8929 Other chronic pain: Secondary | ICD-10-CM

## 2017-05-17 DIAGNOSIS — M25572 Pain in left ankle and joints of left foot: Secondary | ICD-10-CM

## 2017-05-17 NOTE — Progress Notes
DATE OF SERVICE:  05/11/2017     SUBJECTIVE:  Patient here for followup on his left ankle pain after the fracture. He is still having pain and did not go back to work. Also has paper for the __________ to fill.    OBJECTIVE:  Examination of the left leg, no sign of phlebitis. No swelling of the calf muscle. No tenderness. Ankle is not swollen. No effusion.    PLAN:  Patient to continue disability and to return in a month.    ASSESSMENT:  1. Chronic pain of left ankle.  2. Status post fracture left ankle.      Carliyah Cotterman G. Meda Coffee, MD [phone])        [facility] CONF#: [id number]  D: 05/17/2017 10:04:31 T: 05/17/2017 10:17:48 DOCUMENT: [id number]

## 2017-05-17 NOTE — Progress Notes
Encounter Diagnosis   Name Primary?    Chronic pain of left ankle Yes

## 2017-05-19 ENCOUNTER — Ambulatory Visit: Payer: BLUE CROSS/BLUE SHIELD

## 2017-05-19 NOTE — Treatment Summary
PHYSICAL THERAPY TREATMENT NOTE    PATIENT: Chad Watkins  GENDER: male  AGE: 72 y.o.  DOB: May 09, 1945  MRN: [id number]    PPO:    Visit number: Visit count could not be calculated. Make sure you are using a visit which is associated with an episode.   Plan of Care Expires: 07/08/2017  Authorization expires:  Medical necessity     Diagnosis:   No diagnosis found.    Precautions: Weightbearing as tolerated -  status post closed fracture of the left distal fibula shaft - January 03, 2017.    ???    Pain In: 0/10***  Pain Out: 0/10***  Location: Left Ankle (medial and lateral) and at arch  Duration: Variable  Description: swelling    SUBJECTIVE: *** Patient reports walking yesterday for 8 blocks  No pain while at rest but feels swelling when walking.  Patient is limping during gait but without pain..    OBJECTIVE: ***    Mild edema noted on lateral foot and ankle.    - Retrograde massage to left foot/ankle   - Alphabet tracing with left ankle in short sitting.  Patient uses hip and knee for compensation while tracing alphabet in short sitting.  Continued exercise in long sitting to reduce compensations.     - Ankle plantar flexion and dorsiflexion with  resistance band in long sitting.  Did not complete the set of 15 due to complaints of pain. Patient felt pain with especially with plantar flexion.     - Ankle inversion and eversion in short sitting.  Patient reported mild pain/discomfort with inversion/eversion   - Eccentric left gatronemius strengthening with double heel rise and lowering down with left while facing bed.  Patient used two hands for support   - Supported ankle plantar flexion/dorsiflexion on balance board in sitting 15 reps    - patient received ice for 10 minutes         Patient Education:   Education Content: Home exercise program as above   Education Delivery Method:   verbal, written and demonstration   Education Response:   verbalizes understanding and demonstrates understanding ASSESSMENT: *** Patient complained of pain during ankle plantar flexion and dorsiflexion with resistance band.  Pain was worse with plantarflexion. 6/10 with plantarflexion  He also experienced mild pain and stiffness with inversion and eversion.  The pain he experienced during exercise went away after he stopped doing the exercises.  He required intermittent cueing to prevent compensatory movement from knee and hip while tracing the alphabet. Difficulty with balance while performing heel raises requiring upper extremity support. Required cuing to have his feet facing straight forward.  Poor eccentric control when lowering from heel raises.  Required cuing to control eccentric movement.      PLAN: Posture education, balance training, gait training, lumbopelvic strengthening, ankle range of motion, ankle strengthening, modalities as needed, manual treatment as necessary     PTA Communication: All portions of the treatment plan may be delegated to the PTA with the exception of joint mobilizations.     Patient was seen for a total of 30 minutes of treatment time.  30 minutes was in services represented by timed CPT codes.        Chad Watkins SPTA    I have supervised care and agree with the documentation above.    ERMIN Olive Bass, PTA         Home Exercise Program   - Alphabet tracing with left ankle in long  sitting   - Eccentric left gatronemius strengthening with double heel rise and lowering down with left   - Gastrocnemius and soleus stretch at the wall   - Active seated dorsiflexion in sitting  - Double limb heel raise with upper extremity support

## 2017-05-21 NOTE — Treatment Summary
PHYSICAL THERAPY TREATMENT NOTE    PATIENT: Chad Watkins  GENDER: male  AGE: 72 y.o.  DOB: 02-07-1945  MRN: [id number]    PPO:    Visit number: 4   Plan of Care Expires: 07/08/2017  Authorization expires:  Medical necessity     Diagnosis:   Encounter Diagnoses   Name Primary?   ??? Left ankle pain, unspecified chronicity        Precautions: Weightbearing as tolerated -  status post closed fracture of the left distal fibula shaft - January 03, 2017.    ???    Pain In: 0/10  Pain Out: 0/10  Location: Left Ankle (medial and lateral) and at arch  Duration: Variable  Description: swelling    SUBJECTIVE:  Patient reports not much pain but complains of a little swelling on left ankle. Patient reports walking for 10 blocks yesterday. Patient reports a little pain with plantar flexion against resistance when doing home exercise program.  Patient reports he is icing and elevating his left ankle.    OBJECTIVE:     - Marches in supine, bilateral x 10** provided maximal cuing for patient to engage core and for pelvic stability.  New home exercise program, issued handout  - Core bracing ** patient has difficulty drawing in core with exhalation.  New home exercise program, issued handout  - Marches in supine, bilateral x 5 ** re-assess marches after core bracing exercise.     - Clamshells in side lying, bilateral 10 x 5 second holds ** Patient feels some effort in low back with exercise.  Patient has tendency to hold breath.  Patient instructed to count out loud to 3 when raising and lowering knee.  New home exercise program, issued handout  - Abduction bridges 10 x 5 second holds** New home exercise program, issued handout           Patient Education:   Education Content: Home exercise program as above   Education Delivery Method:   verbal, written and demonstration   Education Response:   verbalizes understanding and demonstrates understanding    ASSESSMENT: Patient has difficulty with core bracing during lumbo-pelvic exercises.  Patient had difficulty with pelvic stability while performing marches.  Patient notes feeling some of the effort in his low back.  Provided maximal cuing to draw in core with exhalation during exercises.  Provided cuing to keep spine in neutral with marches and clam shells.  Patient was able to improve form after maximal cuing to feel most of the effort in his buttocks during exercises. Instructed patient to stop exercises at home if he feels any stress on his lower back.        PLAN: Re assess lumbo-pevic home exercises. Posture education, balance training, gait training, lumbopelvic strengthening, ankle range of motion, ankle strengthening, modalities as needed, manual treatment as necessary     PTA Communication: All portions of the treatment plan may be delegated to the PTA with the exception of joint mobilizations.     Patient was seen for a total of 30 minutes of treatment time.  30 minutes was in services represented by timed CPT codes.        Dennard Schaumann SPTA    I have supervised care and agree with the documentation above.    Shawanna Zanders Olive Bass, PTA         Home Exercise Program   - Alphabet tracing with left ankle in long sitting   - Eccentric left gatronemius strengthening with double  heel rise and lowering down with left   - Gastrocnemius and soleus stretch at the wall   - Active seated dorsiflexion in sitting  - Double limb heel raise with upper extremity support

## 2017-05-24 ENCOUNTER — Ambulatory Visit: Payer: BLUE CROSS/BLUE SHIELD

## 2017-05-24 DIAGNOSIS — M25572 Pain in left ankle and joints of left foot: Secondary | ICD-10-CM

## 2017-05-25 NOTE — Telephone Encounter
Pt requesting that he be contacted by the office. Per pt, he believes that his forms weren't filled out correctly because he has not received and disability.

## 2017-06-01 ENCOUNTER — Ambulatory Visit: Payer: BLUE CROSS/BLUE SHIELD

## 2017-06-01 DIAGNOSIS — M25572 Pain in left ankle and joints of left foot: Secondary | ICD-10-CM

## 2017-06-01 NOTE — Treatment Summary
PHYSICAL THERAPY TREATMENT NOTE    PATIENT: Chad Watkins  GENDER: male  AGE: 72 y.o.  DOB: May 10, 1945  MRN: [id number]    PPO:    Visit number: Visit count could not be calculated. Make sure you are using a visit which is associated with an episode.   Plan of Care Expires: 07/08/2017  Authorization expires:  Medical necessity     Diagnosis:   No diagnosis found.    Precautions: Weightbearing as tolerated -  status post closed fracture of the left distal fibula shaft - January 03, 2017.    ???    Pain In: ***/10  Pain Out: ***/10  Location: Left Ankle (medial and lateral) and at arch  Duration: Variable  Description: swelling    SUBJECTIVE:  Patient reports *** not much pain but complains of a little swelling on left ankle. Patient reports walking for 10 blocks yesterday. Patient reports a little pain with plantar flexion against resistance when doing home exercise program.  Patient reports he is icing and elevating his left ankle.    OBJECTIVE: ***    - Marches in supine, bilateral x 10** provided maximal cuing for patient to engage core and for pelvic stability.  New home exercise program, issued handout  - Core bracing ** patient has difficulty drawing in core with exhalation.  New home exercise program, issued handout  - Marches in supine, bilateral x 5 ** re-assess marches after core bracing exercise.     - Clamshells in side lying, bilateral 10 x 5 second holds ** Patient feels some effort in low back with exercise.  Patient has tendency to hold breath.  Patient instructed to count out loud to 3 when raising and lowering knee.  New home exercise program, issued handout  - Abduction bridges 10 x 5 second holds** New home exercise program, issued handout           Patient Education:   Education Content: Home exercise program as above   Education Delivery Method:   verbal, written and demonstration   Education Response:   verbalizes understanding and demonstrates understanding ASSESSMENT: *** Patient has difficulty with core bracing during lumbo-pelvic exercises.  Patient had difficulty with pelvic stability while performing marches.  Patient notes feeling some of the effort in his low back.  Provided maximal cuing to draw in core with exhalation during exercises.  Provided cuing to keep spine in neutral with marches and clam shells.  Patient was able to improve form after maximal cuing to feel most of the effort in his buttocks during exercises. Instructed patient to stop exercises at home if he feels any stress on his lower back.        PLAN: Re assess lumbo-pevic home exercises. Posture education, balance training, gait training, lumbopelvic strengthening, ankle range of motion, ankle strengthening, modalities as needed, manual treatment as necessary     PTA Communication: All portions of the treatment plan may be delegated to the PTA with the exception of joint mobilizations.     Patient was seen for a total of *** minutes of treatment time.  *** minutes was in services represented by timed CPT codes.        Dennard Schaumann SPTA    I have supervised care and agree with the documentation above.    ERMIN Olive Bass, PTA         Home Exercise Program   - Alphabet tracing with left ankle in long sitting   - Eccentric left gatronemius strengthening  with double heel rise and lowering down with left   - Gastrocnemius and soleus stretch at the wall   - Active seated dorsiflexion in sitting  - Double limb heel raise with upper extremity support

## 2017-06-01 NOTE — Treatment Summary
PHYSICAL THERAPY TREATMENT NOTE    PATIENT: Chad Watkins  GENDER: male  AGE: 72 y.o.  DOB: 28-Feb-1945  MRN: [id number]    PPO:    Visit number: 5   Plan of Care Expires: 07/08/2017  Authorization expires:  Medical necessity     Diagnosis:   Encounter Diagnoses   Name Primary?   ??? Left ankle pain, unspecified chronicity      Precautions: Weightbearing as tolerated -  status post closed fracture of the left distal fibula shaft - January 03, 2017.    ???    Pain In: 0/10  Pain Out: 0/10  Location: Left Ankle (medial and lateral) and at arch  Duration: Variable  Description: swelling    SUBJECTIVE:  Patient reports pain to left ankle is absent but still has swelling. Patient notes he is doing his home exercise program daily     OBJECTIVE:   There ex   Testing   rhomberg 30''  Sharpened rhomberg 30'' bilaterally   Single leg balance 2'' per side   Single leg balance 3x30'' per side handhold assist      Manual  Effleurage massage for left ankle and foot edema       Patient Education:   Education Content: Home exercise program as above   Education Delivery Method:   verbal, written and demonstration   Education Response:   verbalizes understanding and demonstrates understanding    ASSESSMENT: balance is good excluding 2'' single leg balance versus age norm of 68'' per side. Patient instructed to focus on performing this for home exercise program daily. Manual therapy today to aide in swelling as well as instructing patient to perform ice daily. During session reports wants to go back to work but needs MD clearance. Recommended further monitoring until return to work status. Noted swelling during manual but did lessen with time    PLAN: continue per primary therapist   Re assess lumbo-pevic home exercises. Posture education, balance training, gait training, lumbopelvic strengthening, ankle range of motion, ankle strengthening, modalities as needed, manual treatment as necessary PTA Communication: All portions of the treatment plan may be delegated to the PTA with the exception of joint mobilizations.     Patient was seen for a total of 30 minutes of treatment time.  30 minutes was in services represented by timed CPT codes.      Pedro Earls, DPT

## 2017-06-15 ENCOUNTER — Ambulatory Visit: Payer: BLUE CROSS/BLUE SHIELD

## 2017-06-15 ENCOUNTER — Telehealth: Payer: BLUE CROSS/BLUE SHIELD

## 2017-06-15 NOTE — Telephone Encounter
S/w pt. States he does not remember the date when he received the two shingrixs vaccines. States he turned in the paper with the date it was received to the office. States he had also left the pharmacy record for the 1st vaccine at office. Informed pt I do not see any dates recorded for the vaccines in his record. Pt understood.,

## 2017-06-15 NOTE — Telephone Encounter
Patient wanted to inform the office he recived his second dosage for shingrix at [place]. Please update immunization record.

## 2017-06-15 NOTE — Treatment Summary
PHYSICAL THERAPY TREATMENT NOTE    PATIENT: Chad Watkins  GENDER: male  AGE: 72 y.o.  DOB: [birth year]  MRN: [id number]    PPO:    Visit number: Visit count could not be calculated. Make sure you are using a visit which is associated with an episode.   Plan of Care Expires: 07/08/2017  Authorization expires:  Medical necessity     Diagnosis:   No diagnosis found.  Precautions: Weightbearing as tolerated -  status post closed fracture of the left distal fibula shaft - January 03, 2017.    ???    Pain In: 0/10  Pain Out: 0/10  Location: Left Ankle (medial and lateral) and at arch  Duration: Variable  Description: swelling    SUBJECTIVE:  Patient reports pain to left ankle is absent but still has swelling. Patient notes he is doing his home exercise program daily     OBJECTIVE:   There ex   Testing   rhomberg 30''  Sharpened rhomberg 30'' bilaterally   Single leg balance 2'' per side   Single leg balance 3x30'' per side handhold assist      Manual  Effleurage massage for left ankle and foot edema       Patient Education:   Education Content: Home exercise program as above   Education Delivery Method:   verbal, written and demonstration   Education Response:   verbalizes understanding and demonstrates understanding    ASSESSMENT: balance is good excluding 2'' single leg balance versus age norm of 7'' per side. Patient instructed to focus on performing this for home exercise program daily. Manual therapy today to aide in swelling as well as instructing patient to perform ice daily. During session reports wants to go back to work but needs MD clearance. Recommended further monitoring until return to work status. Noted swelling during manual but did lessen with time    PLAN: continue per primary therapist   Re assess lumbo-pevic home exercises. Posture education, balance training, gait training, lumbopelvic strengthening, ankle range of motion, ankle strengthening, modalities as needed, manual treatment as necessary PTA Communication: All portions of the treatment plan may be delegated to the PTA with the exception of joint mobilizations.     Patient was seen for a total of 30 minutes of treatment time.  30 minutes was in services represented by timed CPT codes.      Pedro Earls, DPT

## 2017-06-16 ENCOUNTER — Ambulatory Visit: Payer: BLUE CROSS/BLUE SHIELD

## 2017-06-24 ENCOUNTER — Ambulatory Visit: Payer: BLUE CROSS/BLUE SHIELD

## 2017-06-24 NOTE — Treatment Summary
PHYSICAL THERAPY TREATMENT NOTE    PATIENT: Chad Watkins  GENDER: male  AGE: 72 y.o.  DOB: November 08, 1945  MRN: [id number]    PPO:    Visit number: Visit count could not be calculated. Make sure you are using a visit which is associated with an episode.   Plan of Care Expires: 07/08/2017  Authorization expires:  Medical necessity     Diagnosis:   No diagnosis found.  Precautions: Weightbearing as tolerated -  status post closed fracture of the left distal fibula shaft - January 03, 2017.    ???    Pain In: 0/10  Pain Out: 0/10  Location: Left Ankle (medial and lateral) and at arch  Duration: Variable  Description: swelling    SUBJECTIVE:  Patient reports pain to left ankle is absent but still has swelling. Patient notes he is doing his home exercise program daily     OBJECTIVE:   There ex   Testing   rhomberg 30''  Sharpened rhomberg 30'' bilaterally   Single leg balance 2'' per side   Single leg balance 3x30'' per side handhold assist      Manual  Effleurage massage for left ankle and foot edema       Patient Education:   Education Content: Home exercise program as above   Education Delivery Method:   verbal, written and demonstration   Education Response:   verbalizes understanding and demonstrates understanding    ASSESSMENT: balance is good excluding 2'' single leg balance versus age norm of 29'' per side. Patient instructed to focus on performing this for home exercise program daily. Manual therapy today to aide in swelling as well as instructing patient to perform ice daily. During session reports wants to go back to work but needs MD clearance. Recommended further monitoring until return to work status. Noted swelling during manual but did lessen with time    PLAN: continue per primary therapist   Re assess lumbo-pevic home exercises. Posture education, balance training, gait training, lumbopelvic strengthening, ankle range of motion, ankle strengthening, modalities as needed, manual treatment as necessary PTA Communication: All portions of the treatment plan may be delegated to the PTA with the exception of joint mobilizations.     Patient was seen for a total of 30 minutes of treatment time.  30 minutes was in services represented by timed CPT codes.      [name], PT

## 2017-06-29 ENCOUNTER — Ambulatory Visit: Payer: BLUE CROSS/BLUE SHIELD

## 2017-06-29 MED ORDER — SIMVASTATIN 20 MG PO TABS
ORAL_TABLET | 0 refills | Status: AC
Start: 2017-06-29 — End: 2017-09-28

## 2017-07-05 MED ORDER — LANTUS 100 UNIT/ML SC SOLN
0 refills | Status: AC
Start: 2017-07-05 — End: 2017-07-28

## 2017-07-11 MED ORDER — HUMALOG 100 UNIT/ML SC SOLN
0 refills | Status: AC
Start: 2017-07-11 — End: 2017-10-09

## 2017-07-28 MED ORDER — LANTUS 100 UNIT/ML SC SOLN
1 refills | Status: AC
Start: 2017-07-28 — End: 2018-02-06

## 2017-08-05 ENCOUNTER — Ambulatory Visit

## 2017-08-05 DIAGNOSIS — I959 Hypotension, unspecified: Secondary | ICD-10-CM

## 2017-08-05 DIAGNOSIS — E1065 Type 1 diabetes mellitus with hyperglycemia: Secondary | ICD-10-CM

## 2017-08-05 NOTE — Progress Notes
PATIENT: Chad Watkins  MRN: [id number]  DOB: 07-Aug-1945  DATE OF SERVICE: 08/05/2017    CHIEF COMPLAINT:   Chief Complaint   Patient presents with   ??? Hypotension   ??? Diabetes   ??? Hypertension     Diabetes   Pertinent negatives for hypoglycemia include no confusion, dizziness, headaches, nervousness/anxiousness, pallor, seizures, speech difficulty or tremors. Associated symptoms include fatigue. Pertinent negatives for diabetes include no chest pain, no polydipsia, no polyphagia, no polyuria and no weakness.   Hypertension   Pertinent negatives include no chest pain, headaches, neck pain, palpitations or shortness of breath.         No past medical history on file.    No past surgical history on file.    History   Smoking Status   ??? Never Smoker   Smokeless Tobacco   ??? Never Used       No family history on file.    History   Alcohol Use   ??? 4.2 oz/week   ??? 7 Standard drinks or equivalent per week       History   Drug use: Unknown       No Known Allergies    Patient Active Problem List   Diagnosis   ??? Bursitis   ??? Skin cancer   ??? Hyperlipidemia   ??? Palpitation   ??? Need for vaccination   ??? Type 2 diabetes mellitus (HCC/RAF)   ??? Visit for preventive health examination   ??? Hypertension, well controlled   ??? Abnormal EKG   ??? Diabetes mellitus, insulin dependent (IDDM), uncontrolled (HCC/RAF)   ??? Diabetes mellitus type I (HCC/RAF)   ??? Left ankle pain         Current Outpatient Prescriptions:   ???  acetaminophen-codeine (TYLENOL #3) 300-30 mg tablet, TAKE 1 TABLET EVERY 4 HOURS AS NEEDED FOR PAIN, Disp: , Rfl: 0  ???  CIALIS 5 MG tablet, TAKE 1 TABLET (5 MG TOTAL) BY MOUTH DAILY., Disp: 30 tablet, Rfl: 4  ???  ciprofloxacin 0.3% ophthalmic solution, 1 drop qid both eyes., Disp: 5 mL, Rfl: 0  ???  glucose blood test strip, Use as instructed, Disp: 100 each, Rfl: 12  ???  HUMALOG 100 unit/mL injection vial, USE 6 UNITS SUBCUTANEOUSLY 3 TIMES DAILY BEFORE MEALS., Disp: 30 mL, Rfl: 0 ???  insulin syringe needle U-100 (BD INSULIN SYRINGE ULTRAFINE) 31G X 5/16'' 0.3 mL syringe, Inject 1 each under the skin five (5) times daily., Disp: 500 each, Rfl: 3  ???  LANTUS 100 unit/mL injection vial, INJECT 0.2 MLS (20 UNITS TOTAL) UNDER THE SKIN AT BEDTIME.., Disp: 20 mL, Rfl: 1  ???  LISINOPRIL 20 mg tablet, TAKE 1 TABLET (20 MG TOTAL) BY MOUTH DAILY.., Disp: 90 tablet, Rfl: 3  ???  ONETOUCH ULTRA BLUE test strip, USE 1 TEST STRIP TESTING 5 TIMES A DAY., Disp: 500 strip, Rfl: 3  ???  SILDENAFIL 100 mg tablet, TAKE 1 TABLET (100 MG TOTAL) BY MOUTH AS NEEDED FOR FOR ERECTILE DYSFUNCTION., Disp: 10 tablet, Rfl: 4  ???  simvastatin (ZOCOR) 20 mg tablet, TOME UNA TABLETA DIARIAMENTE, Disp: 90 tablet, Rfl: 3  ???  SIMVASTATIN 20 mg tablet, TOME UNA TABLETA TODOS LOS DIAS AL ACOSTARSE, Disp: 90 tablet, Rfl: 0  ???  VIAGRA 100 MG tablet, TAKE AS DIRECTED, Disp: 10 each, Rfl: 2  ???  VIAGRA 100 MG tablet, TAKE 1 TABLET (100 MG TOTAL) BY MOUTH AS NEEDED FOR FOR ERECTILE DYSFUNCTION., Disp: 10 tablet, Rfl: 1  Health Maintenance   Topic Date Due   ??? Hepatitis C Screening  05/20/1962   ??? Diabetes: FOOT EXAM  05/20/1962   ??? Diabetes: EYE EXAM  05/20/1962   ??? Colon Ca Screening: COLONOSCOPY  05/20/1994   ??? Colon Ca Screening: FIT/FOBT  11/21/2015   ??? Annual Preventive Wellness Visit  05/29/2017   ??? Diabetes: NEPHROPATHY MONITORING  05/29/2017   ??? Diabetes: HGB A1C  06/24/2017   ??? Shingles (Shingrix) Vaccine (2 of 2) 07/07/2017   ??? Influenza Vaccine (1) 09/19/2017   ??? Tdap/Td Vaccine (2 - Td) 07/19/2022   ??? Pneumococcal Vaccine  Completed       Patient Care Team:  Jonetta Osgood., MD as PCP - General (Family Medicine)      Subjective:      Chad Watkins is a 72 y.o. male.    Review of Systems   Constitutional: Positive for activity change and fatigue. Negative for appetite change, chills, diaphoresis, fever and unexpected weight change.   HENT: Negative.  Negative for congestion, dental problem, drooling, ear discharge, ear pain, facial swelling, hearing loss, mouth sores, nosebleeds, postnasal drip, rhinorrhea, sinus pain, sinus pressure, sneezing, sore throat, tinnitus, trouble swallowing and voice change.    Eyes: Negative.  Negative for photophobia, pain, discharge, redness, itching and visual disturbance.   Respiratory: Negative.  Negative for apnea, cough, choking, chest tightness, shortness of breath, wheezing and stridor.    Cardiovascular: Negative.  Negative for chest pain, palpitations and leg swelling.   Gastrointestinal: Negative.  Negative for abdominal distention, abdominal pain, anal bleeding, blood in stool, constipation, diarrhea, nausea, rectal pain and vomiting.   Endocrine: Negative.  Negative for cold intolerance, heat intolerance, polydipsia, polyphagia and polyuria.   Genitourinary: Negative.  Negative for decreased urine volume, difficulty urinating, discharge, dysuria, enuresis, flank pain, frequency, genital sores, hematuria, penile pain, penile swelling, scrotal swelling, testicular pain and urgency.   Musculoskeletal: Negative.  Negative for arthralgias, back pain, gait problem, joint swelling, myalgias, neck pain and neck stiffness.   Skin: Negative.  Negative for color change, pallor, rash and wound.   Allergic/Immunologic: Negative.  Negative for environmental allergies, food allergies and immunocompromised state.   Neurological: Positive for light-headedness. Negative for dizziness, tremors, seizures, syncope, facial asymmetry, speech difficulty, weakness, numbness and headaches.   Hematological: Negative.  Negative for adenopathy. Does not bruise/bleed easily.   Psychiatric/Behavioral: Negative.  Negative for agitation, behavioral problems, confusion, decreased concentration, dysphoric mood, hallucinations, self-injury, sleep disturbance and suicidal ideas. The patient is not nervous/anxious and is not hyperactive.    All other systems reviewed and are negative.        Objective: Physical Exam   Constitutional: He is oriented to person, place, and time. He appears well-developed. No distress.   BP 152/83  ~ Pulse 95  ~ Temp 36.8 ???C (98.3 ???F) (Tympanic)  ~ Wt 159 lb (72.1 kg)  ~ BMI 26.46 kg/m???      HENT:   Head: Normocephalic and atraumatic.   Right Ear: External ear normal.   Left Ear: External ear normal.   Nose: Nose normal.   Mouth/Throat: Oropharynx is clear and moist. No oropharyngeal exudate.   Eyes: Pupils are equal, round, and reactive to light. Conjunctivae and EOM are normal. Right eye exhibits no discharge. Left eye exhibits no discharge. No scleral icterus.   Neck: Normal range of motion. Neck supple. No thyromegaly present.   Cardiovascular: Normal rate, regular rhythm, normal heart sounds and intact distal pulses.  Exam  reveals no gallop and no friction rub.    No murmur heard.  Pulmonary/Chest: Effort normal and breath sounds normal. No respiratory distress. He has no wheezes. He has no rales. He exhibits no tenderness.   Abdominal: Soft. Bowel sounds are normal. He exhibits no distension and no mass. There is no tenderness. There is no rebound and no guarding.   Musculoskeletal: Normal range of motion. He exhibits no edema or tenderness.   Lymphadenopathy:     He has no cervical adenopathy.   Neurological: He is alert and oriented to person, place, and time. No cranial nerve deficit. He exhibits normal muscle tone. Coordination normal.   Skin: Skin is warm and dry. No rash noted. He is not diaphoretic. No erythema. No pallor.   Psychiatric: He has a normal mood and affect. His behavior is normal. Judgment and thought content normal.   Vitals reviewed.      ASSESSMENT AND PLAN     1. Uncontrolled type 1 diabetes mellitus with hyperglycemia (HCC/RAF)    2. Palpitation    3. Hypertension, well controlled    4. Hypotension, unspecified hypotension type      BP not well controlled, bp log at home.  Cont / restart current meds and f/u 1 mth. Wt Readings from Last 5 Encounters:   08/05/17 159 lb (72.1 kg)   05/11/17 158 lb (71.7 kg)   04/01/17 158 lb (71.7 kg)   01/04/17 157 lb (71.2 kg)   12/24/16 155 lb (70.3 kg)   ]  BP Readings from Last 5 Encounters:   08/05/17 152/83   05/11/17 (!) 172/93   04/01/17 176/71   01/04/17 152/69   12/24/16 127/64   ]  worsening, continue healthy lifestyle and activity level, adjust meds as needed.  Discussed lifestyle changes and weight loss issues.  To endo and nutrition to help out.      Hgb A1c - HPLC   Date Value Ref Range Status   12/24/2016 9.1 (H) <5.7 % Final     Comment:     For patients with diabetes, an A1c less than (<) or equal (=) to 7.0% is recommended for most patients, however the goal may be higher or lower depending on age and/or other medical problems.   For a diagnosis of diabetes, A1c greater than (>) or equal(=) to 6.5% indicates diabetes; values between 5.7% and 6.4% may indicate an increased risk of developing diabetes.   05/29/2016 7.9 (H) <5.7 % Final     Comment:     For patients with diabetes, an A1c less than (<) or equal (=) to 7.0% is recommended for most patients, however the goal may be higher or lower depending on age and/or other medical problems.   For a diagnosis of diabetes, A1c greater than (>) or equal(=) to 6.5% indicates diabetes; values between 5.7% and 6.4% may indicate an increased risk of developing diabetes.   10/23/2015 8.2 (H) <5.7 % Final     Comment:     For patients with diabetes, an A1c less than (<) or equal (=) to 7.0% is recommended for most patients, however the goal may be higher or lower depending on age and/or other medical problems.   For a diagnosis of diabetes, A1c greater than (>) or equal(=) to 6.5% indicates diabetes; values between 5.7% and 6.4% may indicate an increased risk of developing diabetes.     Cholesterol   Date Value Ref Range Status   05/29/2016 205 See Comment mg/dL Final  Comment: The significance of total cholesterol depends on the values of LDL, HDL, triglycerides and the clinical context. A patient-provider discussion may be considered.       10/23/2015 187 See Comment mg/dL Final     Comment:       The significance of total cholesterol depends on the values of LDL, HDL, triglycerides and the clinical context. A patient-provider discussion may be considered.       10/19/2014 178 See Comment mg/dL Final     Comment:       The significance of total cholesterol depends on the values of LDL, HDL, triglycerides and the clinical context. A patient-provider discussion may be considered.     Cholesterol,LDL,Calc   Date Value Ref Range Status   05/29/2016 74 <100 mg/dL Final     Comment:     If LDL value falls outside of the designated range AND if  included in any of the following categories, a  patient-provider discussion is recommended.     Statin therapy is recommended for individuals:  1. with clinical atherosclerotic cardiovascular disease     irrespective of LDL levels;  2. with LDL > or = 190 mg/dL;  3. with diabetes, aged 73-75 years, with LDL between 70 and     189 mg/dL;  4. without any of the above but who have LDL between 70 and     189 mg/dL and an estimated 45-WUJW risk of     atherosclerotic cardiovascular disease > or = 7.5%     (consider statin therapy if estimated 10-year risk > or =     5.0%) (ACC/AHA 2013 Guidelines).   10/23/2015 61 <100 mg/dL Final     Comment:     If LDL value falls outside of the designated range AND if  included in any of the following categories, a  patient-provider discussion is recommended.     Statin therapy is recommended for individuals:  1. with clinical atherosclerotic cardiovascular disease     irrespective of LDL levels;  2. with LDL > or = 190 mg/dL;  3. with diabetes, aged 73-75 years, with LDL between 70 and     189 mg/dL;  4. without any of the above but who have LDL between 70 and     189 mg/dL and an estimated 11-BJYN risk of atherosclerotic cardiovascular disease > or = 7.5%     (consider statin therapy if estimated 10-year risk > or =     5.0%) (ACC/AHA 2013 Guidelines).   10/19/2014 55 <100 mg/dL Final     Comment:     If LDL value falls outside of the designated range or if  included in any of the following categories, a  patient-provider discussion is recommended.     Statin therapy is recommended for individuals:  1. with clinical atherosclerotic cardiovascular disease     irrespective of LDL levels;  2. with LDL > or = 190 mg/dL;  3. with diabetes, aged 73-75 years, with LDL between 70 and     189 mg/dL;  4. without any of the above but who have LDL between 70 and     189 mg/dL and an estimated 82-NFAO risk of     atherosclerotic cardiovascular disease > or = 7.5%     (consider statin therapy if estimated 10-year risk > or =     5.0%) (ACC/AHA 2013 Guidelines).     Cholesterol, HDL   Date Value Ref Range Status   05/29/2016 120 >40 mg/dL Final  Comment:     If HDL cholesterol level falls outside of the designated  range, a patient-provider discussion is recommended   10/23/2015 115 >40 mg/dL Final     Comment:     If HDL cholesterol level falls outside of the designated  range, a patient-provider discussion is recommended   10/19/2014 112 >40 mg/dL Final     Comment:     If HDL cholesterol level falls outside of the designated  range, a patient-provider discussion is recommended     Triglycerides   Date Value Ref Range Status   05/29/2016 54 <150 mg/dL Final     Comment:       If Triglyceride level falls outside of the designated range,  a patient-provider discussion is recommended.     10/23/2015 56 <150 mg/dL Final     Comment:       If Triglyceride level falls outside of the designated range,  a patient-provider discussion is recommended.     10/19/2014 55 <150 mg/dL Final     Comment:       If Triglyceride level falls outside of the designated range,  a patient-provider discussion is recommended.        Creatinine Date Value Ref Range Status   12/24/2016 0.95 0.60 - 1.30 mg/dL Final   45/40/9811 9.14 0.60 - 1.30 mg/dL Final   78/29/5621 3.08 0.60 - 1.30 mg/dL Final        .      Orders Placed This Encounter   ??? Referral to Endocrinology   ??? Referral for Authorization for MNT   ??? POCT glucose       No Follow-up on file.  The above plan of care, diagnosis, orders, and follow-up were discussed with the patient.  Questions related to this recommended plan of care were answered.  Lonzo Cloud. Karolee Ohs, DO  08/20/2017      Author:  Lonzo Cloud. Genevieve Ritzel 08/20/2017 9:49 AM

## 2017-09-03 ENCOUNTER — Ambulatory Visit

## 2017-09-03 DIAGNOSIS — E1049 Type 1 diabetes mellitus with other diabetic neurological complication: Secondary | ICD-10-CM

## 2017-09-03 DIAGNOSIS — I152 Hypertension secondary to endocrine disorders: Secondary | ICD-10-CM

## 2017-09-03 DIAGNOSIS — E1065 Type 1 diabetes mellitus with hyperglycemia: Secondary | ICD-10-CM

## 2017-09-03 DIAGNOSIS — E1151 Type 2 diabetes mellitus with diabetic peripheral angiopathy without gangrene: Secondary | ICD-10-CM

## 2017-09-03 DIAGNOSIS — E1051 Type 1 diabetes mellitus with diabetic peripheral angiopathy without gangrene: Secondary | ICD-10-CM

## 2017-09-03 DIAGNOSIS — E1069 Type 1 diabetes mellitus with other specified complication: Secondary | ICD-10-CM

## 2017-09-03 DIAGNOSIS — E782 Mixed hyperlipidemia: Secondary | ICD-10-CM

## 2017-09-03 DIAGNOSIS — E1159 Type 2 diabetes mellitus with other circulatory complications: Secondary | ICD-10-CM

## 2017-09-03 DIAGNOSIS — I739 Peripheral vascular disease, unspecified: Secondary | ICD-10-CM

## 2017-09-03 DIAGNOSIS — M25572 Pain in left ankle and joints of left foot: Secondary | ICD-10-CM

## 2017-09-03 DIAGNOSIS — E7849 Other hyperlipidemia: Secondary | ICD-10-CM

## 2017-09-03 DIAGNOSIS — M79672 Pain in left foot: Secondary | ICD-10-CM

## 2017-09-03 DIAGNOSIS — E1040 Type 1 diabetes mellitus with diabetic neuropathy, unspecified: Secondary | ICD-10-CM

## 2017-09-03 DIAGNOSIS — I10 Essential (primary) hypertension: Secondary | ICD-10-CM

## 2017-09-03 NOTE — Progress Notes
PATIENT: Chad Watkins  MRN: [id number]  DOB: 25-May-1945  DATE OF SERVICE: 09/03/2017    CHIEF COMPLAINT:   Chief Complaint   Patient presents with   ??? Follow-up     sugar   ??? Left foot bothering ( hx of fx few months ago)   ??? Hypertension   ??? Diabetes   ??? Hyperlipidemia   NO KNOWN TRAUMA OR INJURY.      Past Medical History:   Diagnosis Date   ??? Diabetic peripheral vascular disease (HCC/RAF) 09/03/2017   ??? DM type 1 with diabetic mixed hyperlipidemia (HCC/RAF) 09/03/2017   ??? Hypertension associated with diabetes (HCC/RAF) 09/03/2017   ??? Peripheral vascular disease (HCC/RAF) 09/03/2017   ??? Type 1 diabetes mellitus with diabetic neuropathy, unspecified (HCC/RAF) 09/03/2017       No past surgical history on file.    History   Smoking Status   ??? Never Smoker   Smokeless Tobacco   ??? Never Used       No family history on file.    History   Alcohol Use   ??? 4.2 oz/week   ??? 7 Standard drinks or equivalent per week       History   Drug use: Unknown       No Known Allergies    Patient Active Problem List   Diagnosis   ??? Bursitis   ??? Skin cancer   ??? Hyperlipidemia   ??? Palpitation   ??? Need for vaccination   ??? Type 2 diabetes mellitus (HCC/RAF)   ??? Visit for preventive health examination   ??? Hypertension, well controlled   ??? Abnormal EKG   ??? Diabetes mellitus, insulin dependent (IDDM), uncontrolled (HCC/RAF)   ??? Diabetes mellitus type I (HCC/RAF)   ??? Left ankle pain   ??? Hypertension associated with diabetes (HCC/RAF)   ??? DM type 1 with diabetic mixed hyperlipidemia (HCC/RAF)   ??? Diabetic peripheral vascular disease (HCC/RAF)   ??? Peripheral vascular disease (HCC/RAF)   ??? Type 1 diabetes mellitus with diabetic neuropathy, unspecified (HCC/RAF)         Current Outpatient Prescriptions:   ???  acetaminophen-codeine (TYLENOL #3) 300-30 mg tablet, TAKE 1 TABLET EVERY 4 HOURS AS NEEDED FOR PAIN, Disp: , Rfl: 0  ???  CIALIS 5 MG tablet, TAKE 1 TABLET (5 MG TOTAL) BY MOUTH DAILY., Disp: 30 tablet, Rfl: 4 ???  ciprofloxacin 0.3% ophthalmic solution, 1 drop qid both eyes., Disp: 5 mL, Rfl: 0  ???  glucose blood test strip, Use as instructed, Disp: 100 each, Rfl: 12  ???  HUMALOG 100 unit/mL injection vial, USE 6 UNITS SUBCUTANEOUSLY 3 TIMES DAILY BEFORE MEALS., Disp: 30 mL, Rfl: 0  ???  insulin syringe needle U-100 (BD INSULIN SYRINGE ULTRAFINE) 31G X 5/16'' 0.3 mL syringe, Inject 1 each under the skin five (5) times daily., Disp: 500 each, Rfl: 3  ???  LANTUS 100 unit/mL injection vial, INJECT 0.2 MLS (20 UNITS TOTAL) UNDER THE SKIN AT BEDTIME.., Disp: 20 mL, Rfl: 1  ???  LISINOPRIL 20 mg tablet, TAKE 1 TABLET (20 MG TOTAL) BY MOUTH DAILY.., Disp: 90 tablet, Rfl: 3  ???  ONETOUCH ULTRA BLUE test strip, USE 1 TEST STRIP TESTING 5 TIMES A DAY., Disp: 500 strip, Rfl: 3  ???  SILDENAFIL 100 mg tablet, TAKE 1 TABLET (100 MG TOTAL) BY MOUTH AS NEEDED FOR FOR ERECTILE DYSFUNCTION., Disp: 10 tablet, Rfl: 4  ???  simvastatin (ZOCOR) 20 mg tablet, TOME UNA TABLETA DIARIAMENTE, Disp: 90  tablet, Rfl: 3  ???  SIMVASTATIN 20 mg tablet, TOME UNA TABLETA TODOS LOS DIAS AL ACOSTARSE, Disp: 90 tablet, Rfl: 0  ???  VIAGRA 100 MG tablet, TAKE AS DIRECTED, Disp: 10 each, Rfl: 2  ???  VIAGRA 100 MG tablet, TAKE 1 TABLET (100 MG TOTAL) BY MOUTH AS NEEDED FOR FOR ERECTILE DYSFUNCTION., Disp: 10 tablet, Rfl: 1    Health Maintenance   Topic Date Due   ??? Hepatitis C Screening  05/20/1962   ??? Diabetes: FOOT EXAM  05/20/1962   ??? Diabetes: EYE EXAM  05/20/1962   ??? Colon Ca Screening: COLONOSCOPY  05/20/1994   ??? Colon Ca Screening: FIT/FOBT  11/21/2015   ??? Annual Preventive Wellness Visit  05/29/2017   ??? Diabetes: NEPHROPATHY MONITORING  05/29/2017   ??? Diabetes: HGB A1C  06/24/2017   ??? Shingles (Shingrix) Vaccine (2 of 2) 07/07/2017   ??? Influenza Vaccine (1) 09/19/2017   ??? Tdap/Td Vaccine (2 - Td) 07/19/2022   ??? Pneumococcal Vaccine  Completed       Patient Care Team:  Jonetta Osgood., MD as PCP - General (Family Medicine)      Subjective: Shane Badeaux is a 72 y.o. male.    Review of Systems   Constitutional: Negative.  Negative for activity change, appetite change, chills, diaphoresis, fatigue, fever and unexpected weight change.   HENT: Negative.  Negative for congestion, dental problem, drooling, ear discharge, ear pain, facial swelling, hearing loss, mouth sores, nosebleeds, postnasal drip, rhinorrhea, sinus pain, sinus pressure, sneezing, sore throat, tinnitus, trouble swallowing and voice change.    Eyes: Negative.  Negative for photophobia, pain, discharge, redness, itching and visual disturbance.   Respiratory: Negative.  Negative for apnea, cough, choking, chest tightness, shortness of breath, wheezing and stridor.    Cardiovascular: Negative for chest pain, palpitations and leg swelling.   Gastrointestinal: Negative.  Negative for abdominal distention, abdominal pain, anal bleeding, blood in stool, constipation, diarrhea, nausea, rectal pain and vomiting.   Endocrine: Negative.  Negative for cold intolerance, heat intolerance, polydipsia, polyphagia and polyuria.   Genitourinary: Negative.  Negative for decreased urine volume, difficulty urinating, discharge, dysuria, enuresis, flank pain, frequency, genital sores, hematuria, penile pain, penile swelling, scrotal swelling, testicular pain and urgency.   Musculoskeletal: Negative.  Negative for arthralgias, back pain, gait problem, joint swelling, myalgias, neck pain and neck stiffness.   Skin: Negative.  Negative for color change, pallor, rash and wound.   Allergic/Immunologic: Negative.  Negative for environmental allergies, food allergies and immunocompromised state.   Neurological: Negative.  Negative for dizziness, tremors, seizures, syncope, facial asymmetry, speech difficulty, weakness, light-headedness, numbness and headaches.   Hematological: Negative.  Negative for adenopathy. Does not bruise/bleed easily.   Psychiatric/Behavioral: Negative.  Negative for agitation, behavioral problems, confusion, decreased concentration, dysphoric mood, hallucinations, self-injury, sleep disturbance and suicidal ideas. The patient is not nervous/anxious and is not hyperactive.    All other systems reviewed and are negative.        Objective:      Physical Exam   Constitutional: He is oriented to person, place, and time. He appears well-developed and well-nourished. No distress.   BP 144/76  ~ Pulse 67  ~ Temp 36.5 ???C (97.7 ???F) (Oral)  ~ Wt 158 lb (71.7 kg)  ~ BMI 26.29 kg/m???      HENT:   Head: Normocephalic and atraumatic.   Right Ear: External ear normal.   Left Ear: External ear normal.   Nose: Nose normal.  Mouth/Throat: Oropharynx is clear and moist. No oropharyngeal exudate.   Eyes: Pupils are equal, round, and reactive to light. Conjunctivae and EOM are normal. Right eye exhibits no discharge. Left eye exhibits no discharge. No scleral icterus.   Neck: Normal range of motion. Neck supple. No thyromegaly present.   Cardiovascular: Normal rate, regular rhythm and normal heart sounds.  Exam reveals no gallop and no friction rub.    No murmur heard.  DECREASED PEDAL PULSES B/L   Pulmonary/Chest: Effort normal and breath sounds normal. No respiratory distress. He has no wheezes. He has no rales. He exhibits no tenderness.   Abdominal: Soft. Bowel sounds are normal. He exhibits no distension and no mass. There is no tenderness. There is no rebound and no guarding.   Musculoskeletal: Normal range of motion. He exhibits no edema or tenderness.   Lymphadenopathy:     He has no cervical adenopathy.   Neurological: He is alert and oriented to person, place, and time. No cranial nerve deficit. He exhibits normal muscle tone. Coordination normal.   DECREASED SENSATION B/L FEET TO FINE TOUCH   Skin: Skin is warm and dry. No rash noted. He is not diaphoretic. No erythema. No pallor.   Psychiatric: He has a normal mood and affect. His behavior is normal. Judgment and thought content normal.   Vitals reviewed. ASSESSMENT AND PLAN     1. Uncontrolled type 1 diabetes mellitus with hyperglycemia (HCC/RAF)    2. Other hyperlipidemia    3. Hypertension, well controlled    4. Left ankle pain, unspecified chronicity    5. Left foot pain    6. Hypertension associated with diabetes (HCC/RAF)    7. DM type 1 with diabetic mixed hyperlipidemia (HCC/RAF)    8. Diabetic peripheral vascular disease (HCC/RAF)    9. Peripheral vascular disease (HCC/RAF)    10. Type 1 diabetes mellitus with diabetic neuropathy, unspecified (HCC/RAF)      HTN AT DECENT CONTROL, CONT HEALTY LIVING AND WEIGHT LOSS TO LOWER MORE.    DM - NOT WELL CONTROLLED, REPRESENTATIVE NUMBERS FROM HOME WITH NEW ISS SHOWS SOME BETTER CONTROL BUT STILL SLIGHTLY ABOVE GOAL. HAS NOT SEEN ENDO NOR NUTRITION AS OF YET.  REITERATED IMPORTANCE TO DO THAT. ALSO PAY ATTENTION FOR SELF EDUCATION OF WHAT DRIVES SUGARS HIGH OR NOT.  SEE WHAT LABS SHOW [name].    LEFT FOOT - HX OF FRACTURE 6-8 MTHS AGO - STILL ACTIING UP.  XRAYS AND PODIATRY.    PVD AND PNP - NO CURRENT ULCERS.  FOOT CARE DISCUSSED.  PODIATRY EVALUATION  OPTHO FOR DIABETIC EYE EXAM    Orders Placed This Encounter   ??? TBOC - Venipuncture w/Collection & Handling   ??? XR foot ap+lat standing bilat (2 views ea)   ??? C-Peptide   ??? CBC & Auto Differential   ??? Comprehensive Metabolic Panel   ??? Hgb A1c   ??? Lipid Panel   ??? TSH with reflex FT4, FT3   ??? Free T4   ??? Urinalysis w/Reflex to Culture   ??? Albumin/Creat Ratio Ur   ??? Testosterone, Free, Total and  SHBG, Men   ??? PSA,Free & Total Profile   ??? Vitamin B12   ??? Vitamin D,25-Hydroxy   ??? Folate,Serum   ??? Chol,LDL,Quant   ??? Sedimentation Rate, Erythrocyte   ??? C-Reactive Protein   ??? Fecal Occult Blood Immunoassay   ??? CBC   ??? Differential, Automated   ??? UA,Dipstick   ??? UA,Microscopic   ??? Referral  to Podiatry   ??? Referral to Ophthalmology   ??? HCV Antibody Screen       Wt Readings from Last 5 Encounters:   09/03/17 158 lb (71.7 kg)   08/05/17 159 lb (72.1 kg) 05/11/17 158 lb (71.7 kg)   04/01/17 158 lb (71.7 kg)   01/04/17 157 lb (71.2 kg)     BP Readings from Last 5 Encounters:   09/03/17 144/76   08/05/17 152/83   05/11/17 (!) 172/93   04/01/17 176/71   01/04/17 152/69       Hgb A1c - HPLC   Date Value Ref Range Status   12/24/2016 9.1 (H) <5.7 % Final     Comment:     For patients with diabetes, an A1c less than (<) or equal (=) to 7.0% is recommended for most patients, however the goal may be higher or lower depending on age and/or other medical problems.   For a diagnosis of diabetes, A1c greater than (>) or equal(=) to 6.5% indicates diabetes; values between 5.7% and 6.4% may indicate an increased risk of developing diabetes.   05/29/2016 7.9 (H) <5.7 % Final     Comment:     For patients with diabetes, an A1c less than (<) or equal (=) to 7.0% is recommended for most patients, however the goal may be higher or lower depending on age and/or other medical problems.   For a diagnosis of diabetes, A1c greater than (>) or equal(=) to 6.5% indicates diabetes; values between 5.7% and 6.4% may indicate an increased risk of developing diabetes.   10/23/2015 8.2 (H) <5.7 % Final     Comment:     For patients with diabetes, an A1c less than (<) or equal (=) to 7.0% is recommended for most patients, however the goal may be higher or lower depending on age and/or other medical problems.   For a diagnosis of diabetes, A1c greater than (>) or equal(=) to 6.5% indicates diabetes; values between 5.7% and 6.4% may indicate an increased risk of developing diabetes.     Cholesterol   Date Value Ref Range Status   05/29/2016 205 See Comment mg/dL Final     Comment:       The significance of total cholesterol depends on the values of LDL, HDL, triglycerides and the clinical context. A patient-provider discussion may be considered.       10/23/2015 187 See Comment mg/dL Final     Comment:       The significance of total cholesterol depends on the values of LDL, HDL, triglycerides and the clinical context. A patient-provider discussion may be considered.       10/19/2014 178 See Comment mg/dL Final     Comment:       The significance of total cholesterol depends on the values of LDL, HDL, triglycerides and the clinical context. A patient-provider discussion may be considered.     Cholesterol,LDL,Calc   Date Value Ref Range Status   05/29/2016 74 <100 mg/dL Final     Comment:     If LDL value falls outside of the designated range AND if  included in any of the following categories, a  patient-provider discussion is recommended.     Statin therapy is recommended for individuals:  1. with clinical atherosclerotic cardiovascular disease     irrespective of LDL levels;  2. with LDL > or = 190 mg/dL;  3. with diabetes, aged 40-75 years, with LDL between 70 and     189  mg/dL;  4. without any of the above but who have LDL between 70 and     189 mg/dL and an estimated 45-WUJW risk of     atherosclerotic cardiovascular disease > or = 7.5%     (consider statin therapy if estimated 10-year risk > or =     5.0%) (ACC/AHA 2013 Guidelines).   10/23/2015 61 <100 mg/dL Final     Comment:     If LDL value falls outside of the designated range AND if  included in any of the following categories, a  patient-provider discussion is recommended.     Statin therapy is recommended for individuals:  1. with clinical atherosclerotic cardiovascular disease     irrespective of LDL levels;  2. with LDL > or = 190 mg/dL;  3. with diabetes, aged 40-75 years, with LDL between 70 and     189 mg/dL;  4. without any of the above but who have LDL between 70 and     189 mg/dL and an estimated 11-BJYN risk of     atherosclerotic cardiovascular disease > or = 7.5%     (consider statin therapy if estimated 10-year risk > or =     5.0%) (ACC/AHA 2013 Guidelines).   10/19/2014 55 <100 mg/dL Final     Comment:     If LDL value falls outside of the designated range or if  included in any of the following categories, a patient-provider discussion is recommended.     Statin therapy is recommended for individuals:  1. with clinical atherosclerotic cardiovascular disease     irrespective of LDL levels;  2. with LDL > or = 190 mg/dL;  3. with diabetes, aged 40-75 years, with LDL between 70 and     189 mg/dL;  4. without any of the above but who have LDL between 70 and     189 mg/dL and an estimated 82-NFAO risk of     atherosclerotic cardiovascular disease > or = 7.5%     (consider statin therapy if estimated 10-year risk > or =     5.0%) (ACC/AHA 2013 Guidelines).     Cholesterol, HDL   Date Value Ref Range Status   05/29/2016 120 >40 mg/dL Final     Comment:     If HDL cholesterol level falls outside of the designated  range, a patient-provider discussion is recommended   10/23/2015 115 >40 mg/dL Final     Comment:     If HDL cholesterol level falls outside of the designated  range, a patient-provider discussion is recommended   10/19/2014 112 >40 mg/dL Final     Comment:     If HDL cholesterol level falls outside of the designated  range, a patient-provider discussion is recommended     Triglycerides   Date Value Ref Range Status   05/29/2016 54 <150 mg/dL Final     Comment:       If Triglyceride level falls outside of the designated range,  a patient-provider discussion is recommended.     10/23/2015 56 <150 mg/dL Final     Comment:       If Triglyceride level falls outside of the designated range,  a patient-provider discussion is recommended.     10/19/2014 55 <150 mg/dL Final     Comment:       If Triglyceride level falls outside of the designated range,  a patient-provider discussion is recommended.        Creatinine   Date Value Ref  Range Status   12/24/2016 0.95 0.60 - 1.30 mg/dL Final   16/10/9602 5.40 0.60 - 1.30 mg/dL Final   98/11/9145 8.29 0.60 - 1.30 mg/dL Final        PHQ-9 Results  Depression Screening (Patient Health Questionnaire PHQ) 08/18/2012 10/19/2014 10/19/2014 09/11/2015 12/24/2016 01/22/2017 PHQ-2: Feeling down, depressed, or hopeless - No No No No No   PHQ-2: Little interest or pleassure in doing things - No No No No No   Little interest or pleasure in doing things Not at all - - - - -   Feeling down, depressed, or hopeless Not at all - - - - -   PHQ-9 Total Score 0 - - - - -   Some recent data might be hidden       GAD-7 Results  No flowsheet data found.    DAST Results  No flowsheet data found.    Audit-C results  No flowsheet data found.      No Follow-up on file.  The above plan of care, diagnosis, orders, and follow-up were discussed with the patient.  Questions related to this recommended plan of care were answered.  Lonzo Cloud. Karolee Ohs, DO  09/03/2017    Author:  Lonzo Cloud. Mariska Daffin 09/03/2017 2:03 PM

## 2017-09-04 LAB — Hgb A1c: HGB A1C - HPLC: 8.7 — ABNORMAL HIGH (ref <5.7)

## 2017-09-04 LAB — Lipid Panel: TRIGLYCERIDES: 81 mg/dL (ref <150)

## 2017-09-04 LAB — C-Peptide: C-PEPTIDE: <0.2 ng/mL — ABNORMAL LOW (ref 1.1–4.3)

## 2017-09-04 LAB — Albumin/Creatinine Ratio,Urine: ALBUMIN,URINE: <12 mg/L (ref <30.0)

## 2017-09-04 LAB — Differential Automated: NEUTROPHIL PERCENT, AUTO: 57.6 (ref 1.80–6.90)

## 2017-09-04 LAB — Sedimentation Rate, Erythrocyte: SEDIMENTATION RATE, ERYTHROCYTE: 26 mm/h — ABNORMAL HIGH (ref <=12)

## 2017-09-04 LAB — HCV Ab Screen: HCV ANTIBODY SCREEN: NONREACTIVE

## 2017-09-04 LAB — Cholesterol, LDL, Quantitated: CHOLESTEROL,LDL,QUANTITATED: 80 mg/dL (ref <100)

## 2017-09-04 LAB — C-Reactive Protein: C-REACTIVE PROTEIN: <0.3 mg/dL (ref <0.8)

## 2017-09-04 LAB — Folate,Serum: FOLATE,SERUM: 18 ng/mL (ref 8.1–30.4)

## 2017-09-04 LAB — TSH with reflex FT4, FT3: TSH: 0.37 u[IU]/mL (ref 0.3–4.7)

## 2017-09-04 LAB — CBC: MCH CONCENTRATION: 32.6 g/dL (ref 31.5–35.5)

## 2017-09-04 LAB — UA,Microscopic: RBCS: 3 {cells}/uL (ref 0–11)

## 2017-09-04 LAB — Comprehensive Metabolic Panel
ANION GAP: 14 mmol/L (ref 8–19)
BILIRUBIN,TOTAL: 0.3 mg/dL (ref 0.1–1.2)

## 2017-09-04 LAB — Free T4: FREE T4: 1.3 ng/dL (ref 0.8–1.7)

## 2017-09-04 LAB — PSA,Free & Total Profile: PSA,TOTAL: 0.52 ng/mL (ref 0–6.5)

## 2017-09-04 LAB — Vitamin D,25-Hydroxy: VITAMIN D,25-HYDROXY: 60 ng/mL — ABNORMAL HIGH (ref 20–50)

## 2017-09-04 LAB — UA,Dipstick: PH,URINE: 6 (ref 5.0–8.0)

## 2017-09-04 LAB — Vitamin B12: VITAMIN B12: 758 pg/mL (ref 254–1060)

## 2017-09-05 ENCOUNTER — Ambulatory Visit

## 2017-09-05 LAB — Testosterone, Free, Total and  SHBG, Men: SEX HORMONE BINDING GLOBULIN: 128 nmol/L — ABNORMAL HIGH (ref 11–80)

## 2017-09-05 MED ORDER — METFORMIN HCL ER 500 MG PO TB24
1000 mg | ORAL_TABLET | Freq: Two times a day (BID) | ORAL | 0 refills | Status: AC
Start: 2017-09-05 — End: 2017-11-29

## 2017-09-07 ENCOUNTER — Telehealth

## 2017-09-07 NOTE — Telephone Encounter
-----   Message from [place]rtle. Gregor, DO sent at 09/05/2017  3:02 PM PDT -----  1) URINE SHOWS DEHYDRATION AND SUGAR IN IT - FOCUS ON 1-2 LITERS OF WATER A DAY  2) CHOLESTEROL LEVELS AT GOAL.  KIDNEYS OK  3) SUGAR CONTROL BASED ON HEMOGLOBIN A1C (HGB A1C) SHOWS SLIGHT IMPROVEMENT BUT STILL ABOVE GOAL.  MAKE SURE TO SEE ENDOCRINOLOGY AND NUTRITION.  CONTINUE INSULIN AS IS (I THINK YOU ARE TAKING LANTUS 10 UNITS TWICE A DAY, CORRECT?)   I WANT TO ADD AN ORAL DIABETIC MEDICATION WHICH WILL MAKE YOUR BODY MORE SENSITIVE TO INSULIN AND HOPEFULLY MAKE THE INSULIN YOU GIVE MORE EFFECTIVE  4) REST OF LABS LOOK GOOD.  RECHECK LABS IN 2-3 MONTHS BUT EMAIL ME A WEEKS WORTH OF INSULIN SLIDING SCALE AFTER START METFORMIN.

## 2017-09-07 NOTE — Telephone Encounter
L/m for pt. To call back re: results. ec

## 2017-09-10 ENCOUNTER — Inpatient Hospital Stay

## 2017-09-10 DIAGNOSIS — M25572 Pain in left ankle and joints of left foot: Secondary | ICD-10-CM

## 2017-09-10 DIAGNOSIS — M79672 Pain in left foot: Secondary | ICD-10-CM

## 2017-09-10 DIAGNOSIS — I10 Essential (primary) hypertension: Secondary | ICD-10-CM

## 2017-09-10 DIAGNOSIS — E1065 Type 1 diabetes mellitus with hyperglycemia: Secondary | ICD-10-CM

## 2017-09-10 DIAGNOSIS — E7849 Other hyperlipidemia: Secondary | ICD-10-CM

## 2017-09-14 ENCOUNTER — Telehealth

## 2017-09-14 NOTE — Telephone Encounter
Left vmail to r/c

## 2017-09-14 NOTE — Telephone Encounter
-----   Message from [place]. Gregor, DO sent at 09/11/2017 10:00 AM PDT -----  Foot xray normal, fracture is healing well from months ago.

## 2017-09-16 ENCOUNTER — Ambulatory Visit

## 2017-09-16 DIAGNOSIS — S82892A Other fracture of left lower leg, initial encounter for closed fracture: Secondary | ICD-10-CM

## 2017-09-16 DIAGNOSIS — M25572 Pain in left ankle and joints of left foot: Secondary | ICD-10-CM

## 2017-09-16 DIAGNOSIS — G8929 Other chronic pain: Secondary | ICD-10-CM

## 2017-09-16 DIAGNOSIS — M7662 Achilles tendinitis, left leg: Secondary | ICD-10-CM

## 2017-09-16 NOTE — Progress Notes
CHIEF COMPLAINT   Ankle pain and swelling    HISTORY OF PRESENT ILLNESS   Pt presents with left ankle pain.  Pain was gradual and aching localized to the lateral and anterior ankle.  The pain does not radiate.  The symptoms are worse with activity.  Prior treatments include rest.  He suffered an ankle fracture in 2016 and it was treated with a bk walker and NWB.  He's had the pain since then.    PAST MEDICAL HISTORY     Past Medical History:   Diagnosis Date   ??? Diabetic peripheral vascular disease (HCC/RAF) 09/03/2017   ??? DM type 1 with diabetic mixed hyperlipidemia (HCC/RAF) 09/03/2017   ??? Hypertension associated with diabetes (HCC/RAF) 09/03/2017   ??? Peripheral vascular disease (HCC/RAF) 09/03/2017   ??? Type 1 diabetes mellitus with diabetic neuropathy, unspecified (HCC/RAF) 09/03/2017       MEDICATIONS     Outpatient Medications Prior to Visit   Medication Sig   ??? acetaminophen-codeine (TYLENOL #3) 300-30 mg tablet TAKE 1 TABLET EVERY 4 HOURS AS NEEDED FOR PAIN   ??? CIALIS 5 MG tablet TAKE 1 TABLET (5 MG TOTAL) BY MOUTH DAILY.   ??? ciprofloxacin 0.3% ophthalmic solution 1 drop qid both eyes.   ??? glucose blood test strip Use as instructed   ??? HUMALOG 100 unit/mL injection vial USE 6 UNITS SUBCUTANEOUSLY 3 TIMES DAILY BEFORE MEALS.   ??? insulin syringe needle U-100 (BD INSULIN SYRINGE ULTRAFINE) 31G X 5/16'' 0.3 mL syringe Inject 1 each under the skin five (5) times daily.   ??? LANTUS 100 unit/mL injection vial INJECT 0.2 MLS (20 UNITS TOTAL) UNDER THE SKIN AT BEDTIME.Marland Kitchen   ??? LISINOPRIL 20 mg tablet TAKE 1 TABLET (20 MG TOTAL) BY MOUTH DAILY.Marland Kitchen   ??? metFORMIN (GLUCOPHAGE XR) 500 mg PO ER 24 hr tablet Take 2 tablets (1,000 mg total) by mouth two (2) times daily with meals.   ??? ONETOUCH ULTRA BLUE test strip USE 1 TEST STRIP TESTING 5 TIMES A DAY.   ??? SILDENAFIL 100 mg tablet TAKE 1 TABLET (100 MG TOTAL) BY MOUTH AS NEEDED FOR FOR ERECTILE DYSFUNCTION.   ??? simvastatin (ZOCOR) 20 mg tablet TOME UNA TABLETA DIARIAMENTE ??? SIMVASTATIN 20 mg tablet TOME UNA TABLETA TODOS LOS DIAS AL ACOSTARSE   ??? VIAGRA 100 MG tablet TAKE AS DIRECTED   ??? VIAGRA 100 MG tablet TAKE 1 TABLET (100 MG TOTAL) BY MOUTH AS NEEDED FOR FOR ERECTILE DYSFUNCTION.     No facility-administered medications prior to visit.        ALLERGIES   No Known Allergies    SURGICAL HISTORY   No past surgical history on file.    ROS   General ROS: negative    PHYSICAL EXAM   There were no vitals filed for this visit.  GEN: no acute distress  VASC:  Dorsalis Pedis:  present  Posterior Tibial:  present  There is brisk capillary refill time to all digits less than 3 seconds.   varicosities absent Bilateral  edema  mild Left ankle  ecchymosis is not observed  DERM:   Tone and turgor are adequate.   Erythema is none.    NEURO:  protective sensation is diminished to the digits.  Vibratory sensation is diminished.    MSK:  Muscle strength 5/5 in all 4 quadrants.    Ankle ROM is normal, Subtalar joint ROM is normal.  There is tenderness to palpation of the L lateral ankle at the fibula.  IMAGING   XR ANKLE AP LAT MORTISE STANDING LEFT 3V : 04/06/2017 11:08 AM  ???  COMPARISON: 03/09/2017  ???  INDICATION: left ankle fracture Weber B    ???  ???  IMPRESSION: The Weber B fracture shows stable near-anatomic alignment. There is progressive healing. The ankle mortise is normal.  ???  Signed by: Chad Watkins   04/06/2017 12:39 PM      ASSESSMENT/PLAN   Chad Watkins is a 72 y.o. male who presents with:  1. Closed fracture of left ankle, initial encounter  MR ankle wo contrast left   2. Chronic pain of left ankle     3. Tendonitis, Achilles, left       Discussed findings with patient  Patient will avoid prolonged walking and running  Handicap placard 3 months  MRI of the L ankle to evaluate for healing, possible non union  F/u after MRI to discuss results.  Ankle support braced dispensed.    Chad Watkins [name], DPM  09/16/2017

## 2017-09-21 NOTE — Telephone Encounter
Podiatrist Mejia discussed with pt on 8/27.

## 2017-09-27 MED ORDER — SIMVASTATIN 20 MG PO TABS
ORAL_TABLET | 0 refills | Status: AC
Start: 2017-09-27 — End: 2017-12-28

## 2017-10-03 ENCOUNTER — Inpatient Hospital Stay

## 2017-10-03 DIAGNOSIS — S82892A Other fracture of left lower leg, initial encounter for closed fracture: Secondary | ICD-10-CM

## 2017-10-09 MED ORDER — HUMALOG 100 UNIT/ML SC SOLN
2 refills | Status: AC
Start: 2017-10-09 — End: 2018-01-11

## 2017-10-09 MED ORDER — BD INSULIN SYRINGE U/F 31G X 5/16'' 0.3 ML MISC
1 | Freq: Every day | SUBCUTANEOUS | 3 refills | Status: AC
Start: 2017-10-09 — End: 2018-04-14

## 2017-11-02 ENCOUNTER — Ambulatory Visit: Payer: BLUE CROSS/BLUE SHIELD | Attending: "Endocrinology

## 2017-11-02 DIAGNOSIS — G479 Sleep disorder, unspecified: Secondary | ICD-10-CM

## 2017-11-02 MED ORDER — DEXCOM G6 TRANSMITTER MISC
1 | 3 refills | Status: AC
Start: 2017-11-02 — End: 2018-01-28

## 2017-11-02 MED ORDER — DEXCOM G6 SENSOR MISC
3 refills | Status: AC
Start: 2017-11-02 — End: 2018-01-28

## 2017-11-02 MED ORDER — GLUCAGON 3 MG/DOSE NA POWD
3 mg | NASAL | 2 refills | Status: AC | PRN
Start: 2017-11-02 — End: 2018-01-28

## 2017-11-02 MED ORDER — DEXCOM G6 RECEIVER DEVI
1 | 1 refills | Status: AC
Start: 2017-11-02 — End: 2018-01-28

## 2017-11-02 NOTE — Patient Instructions
-  Continue MDI with Lantus 12 and Humalog per carbs and glucose level:                   Humalog 1 unit per 10 gms of carbohydrate (sugars and starches), and 1 unit extra if over 150 and 2 units extra if over 200 or 3 units extra if over 250 and 4 units extra if over 300.    -High frequency glucose monitoring, at least before every meal and at bedtime, 4 X daily UNTIL you get a Dexcom cgm.    -Hypoglycemia protocol reviewed. Carry glucose tabs at all times.   -Check glucose prior to driving. Glucose must be > 100 mg/dl. Reviewed risks related to driving with hypoglycemia   -Glucagon reviewed.  Baqsimi nasal spray if possible.  Ordered.  Waiting for coupon.    -Annual ophthalmic exam today ([facility])

## 2017-11-02 NOTE — Progress Notes
PATIENT: Chad Watkins  MRN: [id number]  DOB: [birth year]  DATE OF SERVICE: 11/02/2017    REFERRING PRACTITIONER: Trena Platt., DO   PRIMARY CARE PROVIDER: Jonetta Osgood., MD    Reason for Consultation: Type 1 Diabetes Mellitus      Subjective:     History of Present Illness  Chad Watkins is a 72 y.o. male with type 1 diabetes mellitus. The patient presents for consultation to the [facility].       Type 1 diabetes was diagnosed in 1989, and treated with insulin then.  MDI.  Currently taking Lantus q hs and Humalog, per scale.    He denies hx of DKA, but has had ''comas'' attributed to hypoglycemia that required paramedic visits, twice in July 2019.  He's refused to go to the hospital, even ''unresponsive''.  Paramedics resuscitate him.  Family (at least daughter present with him today) not aware of glucagon use.  His PMD reduced his insulin recently for freq hypoglycemia.  His daughter believes that at least some of the hypoglycemic episodes were associated with drinking alcohol at home, hard liquor.  He makes poor choices with food (skips) and insulin at those times per dtr.     No known DM complications.        Current DM Medications  Lantus 20 units => 10 since July  Humalog 3 - 10 units ac tid depending on glucose, per scale:  < 80 juice  80- 150     0  (but pt often gives 3 if he's going to eat)   150 - 200  4  200-250    6   (pt gives 7 over 200)  250-300    8   >300        10  (pt gives 8-9)     Glucose Review:  Data from pt recall:  Fasting 180 mg/dl today, usually in 811B, <200.   220 in afternoon.   200s before L and 100 before D    Frequency of Checks: 2-4/day    Exercise:   Walks. On his feet 8 hrs at work all day.     Nutrition:  B/brunch 10 am at home  Snacks at work  Lunch at work 5 pm   Sara Lee 10-10:30       Complications & Associated Co-morbidities Yes No Comments/Treatment to Date   Retinopathy  []    [x]      Nephropathy  []    [x]      Neuropathy  []    [x]  Gastroparesis  []   [x]     CAD  []    [x]      Stroke  []    [x]      PVD  []   [x]     Sleep Apnea  [x]    [x]   Not sure. Poor sleep?       Routine Maintenance Yes No Comments/Last Performed   ACE-I  [x]    []      Statin  [x]    []      Retinal exam  []    [x]   Ordered today    Foot exam  [x]    []      Flu shot  [x]    []   ?       Past Medical History  Past Medical History:   Diagnosis Date   ??? Diabetic peripheral vascular disease (HCC/RAF) 09/03/2017   ??? DM type 1 with diabetic mixed hyperlipidemia (HCC/RAF) 09/03/2017   ??? Hypertension associated with diabetes (HCC/RAF)  09/03/2017   ??? Peripheral vascular disease (HCC/RAF) 09/03/2017   ??? Type 1 diabetes mellitus with diabetic neuropathy, unspecified (HCC/RAF) 09/03/2017       Past Surgical History  No past surgical history on file.    Medications  Current Outpatient Prescriptions   Medication Sig   ??? acetaminophen-codeine (TYLENOL #3) 300-30 mg tablet TAKE 1 TABLET EVERY 4 HOURS AS NEEDED FOR PAIN   ??? BD INSULIN SYRINGE U/F 31G X 5/16'' 0.3 ML syringe INJECT 1 EACH UNDER THE SKIN FIVE (5) TIMES DAILY.   ??? CIALIS 5 MG tablet TAKE 1 TABLET (5 MG TOTAL) BY MOUTH DAILY.   ??? ciprofloxacin 0.3% ophthalmic solution 1 drop qid both eyes.   ??? glucose blood test strip Use as instructed   ??? HUMALOG 100 unit/mL injection vial USE 6 UNITS SUBCUTANEOUSLY 3 TIMES DAILY BEFORE MEALS.   ??? LANTUS 100 unit/mL injection vial INJECT 0.2 MLS (20 UNITS TOTAL) UNDER THE SKIN AT BEDTIME.Marland Kitchen   ??? LISINOPRIL 20 mg tablet TAKE 1 TABLET (20 MG TOTAL) BY MOUTH DAILY.Marland Kitchen   ??? metFORMIN (GLUCOPHAGE XR) 500 mg PO ER 24 hr tablet Take 2 tablets (1,000 mg total) by mouth two (2) times daily with meals.   ??? ONETOUCH ULTRA BLUE test strip USE 1 TEST STRIP TESTING 5 TIMES A DAY.   ??? SILDENAFIL 100 mg tablet TAKE 1 TABLET (100 MG TOTAL) BY MOUTH AS NEEDED FOR FOR ERECTILE DYSFUNCTION.   ??? simvastatin (ZOCOR) 20 mg tablet TOME UNA TABLETA DIARIAMENTE   ??? SIMVASTATIN 20 mg tablet TOME UNA TABLETA TODOS LOS DIAS AL ACOSTARSE ??? VIAGRA 100 MG tablet TAKE AS DIRECTED   ??? VIAGRA 100 MG tablet TAKE 1 TABLET (100 MG TOTAL) BY MOUTH AS NEEDED FOR FOR ERECTILE DYSFUNCTION.     No current facility-administered medications for this visit.      Prior to Admission medications    Medication Sig Start Date End Date Taking? Authorizing Provider   acetaminophen-codeine (TYLENOL #3) 300-30 mg tablet TAKE 1 TABLET EVERY 4 HOURS AS NEEDED FOR PAIN 11/18/16  Yes [provider]   BD INSULIN SYRINGE U/F 31G X 5/16'' 0.3 ML syringe INJECT 1 EACH UNDER THE SKIN FIVE (5) TIMES DAILY. 10/08/17  Yes Hallis, Raouf G., MD   CIALIS 5 MG tablet TAKE 1 TABLET (5 MG TOTAL) BY MOUTH DAILY. 09/14/14  Yes Hallis, Raouf G., MD   ciprofloxacin 0.3% ophthalmic solution 1 drop qid both eyes. 11/21/14  Yes Hallis, Raouf G., MD   glucose blood test strip Use as instructed 02/05/12  Yes Hallis, Raouf G., MD   HUMALOG 100 unit/mL injection vial USE 6 UNITS SUBCUTANEOUSLY 3 TIMES DAILY BEFORE MEALS. 10/08/17  Yes Hallis, Raouf G., MD   LANTUS 100 unit/mL injection vial INJECT 0.2 MLS (20 UNITS TOTAL) UNDER THE SKIN AT BEDTIME.. 07/27/17  Yes Hallis, Raouf G., MD   LISINOPRIL 20 mg tablet TAKE 1 TABLET (20 MG TOTAL) BY MOUTH DAILY.. 12/30/16  Yes Hallis, Raouf G., MD   metFORMIN (GLUCOPHAGE XR) 500 mg PO ER 24 hr tablet Take 2 tablets (1,000 mg total) by mouth two (2) times daily with meals. 09/05/17 09/05/18 Yes Gregor, Scott M., DO   ONETOUCH ULTRA BLUE test strip USE 1 TEST STRIP TESTING 5 TIMES A DAY. 10/05/16  Yes Hallis, Raouf G., MD   SILDENAFIL 100 mg tablet TAKE 1 TABLET (100 MG TOTAL) BY MOUTH AS NEEDED FOR FOR ERECTILE DYSFUNCTION. 11/18/16  Yes Hallis, Raouf G., MD   simvastatin (  ZOCOR) 20 mg tablet TOME UNA TABLETA DIARIAMENTE 03/31/13  Yes Hallis, Raouf G., MD   SIMVASTATIN 20 mg tablet TOME UNA TABLETA TODOS LOS DIAS AL ACOSTARSE 09/27/17  Yes Hallis, Raouf G., MD   VIAGRA 100 MG tablet TAKE AS DIRECTED 04/01/12  Yes Hallis, Raouf G., MD VIAGRA 100 MG tablet TAKE 1 TABLET (100 MG TOTAL) BY MOUTH AS NEEDED FOR FOR ERECTILE DYSFUNCTION. 07/31/15  Yes Hallis, Raouf G., MD       Allergies  Patient has no known allergies.    Family History  No family history on file.    Social History  Social History     Social History   ??? Marital status: Married     Spouse name: N/A   ??? Number of children: N/A   ??? Years of education: N/A     Social History Main Topics   ??? Smoking status: Never Smoker   ??? Smokeless tobacco: Never Used   ??? Alcohol use 4.2 oz/week     7 Standard drinks or equivalent per week   ??? Drug use: Unknown   ??? Sexual activity: Not Asked     Other Topics Concern   ??? None     Social History Narrative   ??? None       Review of Systems:  A 14 point Review of Systems conducted was not significant other than as discussed in HPI above. (see [facility] Endocrinology questionnaire reviewed with patient, and scanned to computer).    Objective:      Vitals: BP 171/78  ~ Pulse 67  ~ Wt 152 lb 12.8 oz (69.3 kg)  ~ BMI 25.43 kg/m???    Wt Readings from Last 3 Encounters:   11/02/17 152 lb 12.8 oz (69.3 kg)   09/03/17 158 lb (71.7 kg)   08/05/17 159 lb (72.1 kg)            Additional Findings   Gen  [x]  NAD  [x]  Well nourished   Elderly HispA man    HEENT  [x]  Normocephalic  [x]  Atraumatic       Neck  [x]  Neck supple    [x]  No cervical LAD  []  No cervical fat pad     Thyroid  [x]  Normal size  [x]  No masses/nodules  [x]  Non-tender  []  No bruit    Chest  [x]  CTABL  [x]  No wheezes  [x]  No crackles  [x]  No rales    Heart  [x]  RRR  [x]  No M/R/G      CV / Peripheral Vascular  [x]  Pedal pulses 2+  []  Carotid bruit      GI  [x]  Soft  [x]  Non-tender  [x]  Hepatosplenomegaly  []  Normal BS    Ext  [x]  No tremor  []  No edema  [x]  No clubbing  [x]  No cyanosis Left foot fx, in brace   Neuro  [x]  Normal       monofilament  []  Normal vibratory sense  [x]  Normal strength  []  DTR 2+    Derm  [x]  No foot ulcers  [] + Onychomycosis toenails. Dystrophic  [] + Scarring at insulin injx sites, mostly peri-umbilical.        Lab Review:  Lab Results   Component Value Date    HGBA1C 8.7 (H) 09/03/2017    HGBA1C 9.1 (H) 12/24/2016    HGBA1C 7.9 (H) 05/29/2016      Lab Results   Component Value Date    GLUCOSE 261 (H) 09/03/2017  ALBCREATUR  09/03/2017      Comment:      Unable to calculate. Test result is???below detection???limit.    CHOLDLQ 80 09/03/2017    CHOLDLCAL 74 05/29/2016    CPEPTIDE <0.2 (L) 09/03/2017      Lab Results   Component Value Date    CREAT 0.86 09/03/2017    BUN 22 09/03/2017    NA 135 09/03/2017    K 4.5 09/03/2017    CL 99 09/03/2017    CO2 22 09/03/2017      Lab Results   Component Value Date    WBC 6.90 09/03/2017    HGB 13.3 (L) 09/03/2017    HCT 40.8 09/03/2017    MCV 94.0 09/03/2017    PLT 220 09/03/2017      No results found for: OSMOLALITY   Lab Results   Component Value Date    KETONESUR Trace (A) 09/03/2017      No results found for: PHVEN, PHART   Lab Results   Component Value Date    CHOL 182 09/03/2017    CHOLHDL 94 09/03/2017    CHOLDLQ 80 09/03/2017    CHOLDLCAL 74 05/29/2016    TRIGLY 81 09/03/2017        Assessment/Plan:    72 y.o. male presenting to the [facility] with the following issues:    1. Type 1 Diabetes Mellitus, uncontrolled   Lab Results   Component Value Date    HGBA1C 8.7 (H) 09/03/2017    HGBA1C 9.1 (H) 12/24/2016    HGBA1C 7.9 (H) 05/29/2016     Uncontrolled T1DM.  No known DM complications.   A1C goal < 7.5%: to reduce risk of diabetes related complications to the eyes, feet and kidneys with additional goal of hypoglycemia risk reduction.   -Continue MDI with Lantus 12 units q hs and Humalog per carbs and glucose level:                   Humalog 1 unit per 10 gms of carbohydrate (sugars and starches), PLUS 1 unit extra if premeal glucose is over 150 and 2 units extra if over 200,  3 units extra if over 250, or  4 units extra if over 300.   See CDE/RN ASAP to learn to adjust insulin based on carbs and premeal glucose.    -High frequency glucose monitoring, at least before every meal and at bedtime, 4 X daily UNTIL you get a Dexcom cgm. Ordered.   -Hypoglycemia protocol reviewed. ''Rule of 15'' handout given also. Carry glucose tabs at all times.   -Check glucose prior to driving. Glucose must be > 100 mg/dl. Reviewed risks related to driving with hypoglycemia   -Glucagon reviewed.  Baqsimi (glucagon) nasal spray if possible.  Ordered.  Waiting for coupon.    -Annual ophthalmic exam due.  Referred.  Tried to get remote Optos exam but unable.  CaLL for appt.    -Foot exam up-to-date, done today   -DM educator ASAP     2. Hypertension   -Continue current regimen, including ACE-I inhibitor or ARB therapy    3. Hyperlipidemia   Per AHA guidelines, statin therapy is recommended with history of diabetes mellitus   -Continue statin therapy.    4. Diabetic microvascular disease screening:  Health Maintenance   Topic Date Due   ??? Diabetes: Dilated Eye Exam  05/20/1962   ??? Diabetes: Hemoglobin A1c Blood Test  03/06/2018   ??? Diabetes: Kidney Monitoring  09/04/2018   ??? Diabetes: Foot Exam  09/17/2018     5. ETOH use.  Needs to stop or reduce to safe drinking.  Discuss further with PMD.     6. Sleep Disorder.  Dtr suspects OSA.  See PMD re: eval.    7. Left ankle fx.   Wearing brace.       Orders Placed This Encounter   ??? POCT Glycosylated Hemoglobin (Hb A1C)   ??? REMOTE FUNDUS PHOTO   ??? REMOTE FUNDUS PHOTO   ??? Glucagon (BAQSIMI TWO PACK) 3 MG/DOSE POWD   ??? Continuous Blood Gluc Receiver (DEXCOM G6 RECEIVER) DEVI   ??? Continuous Blood Gluc Transmit (DEXCOM G6 TRANSMITTER) MISC   ??? Continuous Blood Gluc Sensor (DEXCOM G6 SENSOR) MISC     INSTRUCTIONS:    -Continue multiple dose insulin treatment of diabetes with Lantus 12 units q hs and Humalog per carbs and glucose level:  Humalog 1 unit per 10 gms of carbohydrate (sugars and starches), PLUS 1 unit extra if premeal glucose is over 150 and 2 units extra if over 200,  3 units extra if over 250, or  4 units extra if over 300.               -See CDE/RN Rayford Halsted ASAP to learn to adjust insulin based on carbs and premeal glucose.    -High frequency glucose monitoring, at least before every meal and at bedtime, 4 X daily UNTIL you get a Dexcom cgm. Ordered.   -Hypoglycemia protocol reviewed. ''Rule of 15'' handout given also. Carry glucose tabs at all times.   -Check glucose prior to driving. Glucose must be > 100 mg/dl. Reviewed risks related to driving with hypoglycemia   -Glucagon reviewed.  Baqsimi (glucagon) nasal spray if possible.  Ordered.  Waiting for coupon.    -Annual ophthalmic exam due.  Referred.  Tried to get remote Optos exam but unable.  CaLL for appt.     60+ minutes were spent with the patient and dtr translating.  Greater than 50% of the office visit time was devoted to counseling the patient, reviewing previous tests, discussing monitoring and treatment options, and developing a follow up plan.       Author: Birdie Hopes. Earlene Plater, MD 11/02/2017 10:29 AM

## 2017-11-03 ENCOUNTER — Telehealth: Payer: BLUE CROSS/BLUE SHIELD

## 2017-11-03 NOTE — Telephone Encounter
Called patient, notified patient our office would be submitting paperwork to dexcom in order to get authorization through insurance. Pending approval.     - Sande Brothers, LVN

## 2017-11-03 NOTE — Telephone Encounter
Forwarded by: Jillyan Plitt Rosa Aydyn Testerman

## 2017-11-03 NOTE — Telephone Encounter
Call Back Request    MD:  Rosana Hoes     Reason for call back: patients daughter is requesting a call back from [place]. Daughter was advise  Insurance does not cover pharmacy to obtain the G6.  Any Symptoms:  []  Yes  [x]  No      ? If yes, what symptoms are you experiencing:    o Duration of symptoms (how long):    o Have you taken medication for symptoms (OTC or Rx):      Patient or caller has been notified of the 24-48 hour turnaround time.

## 2017-11-03 NOTE — Telephone Encounter
Dexcom CMN left on MD desk to sign 10/16 ES

## 2017-11-04 ENCOUNTER — Ambulatory Visit: Payer: BLUE CROSS/BLUE SHIELD

## 2017-11-04 DIAGNOSIS — S86302D Unspecified injury of muscle(s) and tendon(s) of peroneal muscle group at lower leg level, left leg, subsequent encounter: Secondary | ICD-10-CM

## 2017-11-04 DIAGNOSIS — S8265XD Nondisplaced fracture of lateral malleolus of left fibula, subsequent encounter for closed fracture with routine healing: Secondary | ICD-10-CM

## 2017-11-04 DIAGNOSIS — H6123 Impacted cerumen, bilateral: Secondary | ICD-10-CM

## 2017-11-04 MED ORDER — AMLODIPINE BESY-BENAZEPRIL HCL 5-20 MG PO CAPS
1 | ORAL_CAPSULE | Freq: Two times a day (BID) | ORAL | 3 refills | Status: AC
Start: 2017-11-04 — End: 2018-02-22

## 2017-11-08 ENCOUNTER — Institutional Professional Consult (permissible substitution): Payer: BLUE CROSS/BLUE SHIELD

## 2017-11-08 NOTE — Progress Notes
Diabetes Education Note    Patient: Chad Watkins  MRN: [id number]  DOB: 06/03/1945  Date of Service: 11/08/2017    Referring Practitioner: Signe Colt., MD  Primary Care Provider:  Trena Platt., DO    Chief Complaint: Diabetes management and self-care training      [x]  Seen accompanied by daughters     Subjective:     HISTORY OF PRESENT ILLNESS:    Chad Watkins is a 72 y.o. male with a h/o Diabetes Mellitus type 1  without complications who is referred by  Dr. Earlene Plater, Birdie Hopes., MD for BG control and self-management training.     Disease History Date(s) Comments   Initial diagnosis 1989    Initial therapy     Current therapy  Basal bolus MDI   Per patient's account, daughters want to make sure insulin is matching food he is eating. Lives with his wife. Wake up around 10ish and drink coffee. BK around 11 ish. Works around 1:30. Meal around 5ish and then 10:30-11pm. Works at [employer] at [employer]. If below 100 mg/dL won't take insulin.  Then adjusts insulin based on what eating. Preferred language is spanish; daughters did not want interpreter, interpreted for patient as needed.   If 150 mg/dL and eating burrito; 3 units humalog. humalog 3 units every 100 mg/dL.        Glycemic Pattern  []  Did not bring meter to visit  [x]  Reviewed  [x]  Discussed  []  Did not review meter Values  Per:   []  Patient report     []  Patient record      [x]   Meter download        []  Pump download     []  CGM download   Average: 236 mg/dl       []   7 days  []  14 days   []  30 days          Standard deviation: 110 []   Up   []  down from ---     CGM Average: -- mg/dl    []   Up   []  down from ---  Standard deviation: -- []   Up   []  down from ---        % in target of 70-180 (Goal >70%)      % above target      % above 250      % below target (Goal <3%)       % below 54   Average Values Pre/post Meals (mg/dL)  Fasting:  2 hrs post breakfast:        Pre-lunch:    2 hrs post lunch:      Pre-dinner:                2 hrs post dinner: HS:                        Noc:         Lab Results   Component Value Date    HGBA1C 8.1 (A) 11/02/2017   .  Pattern:       Complications Yes No Comments/Treatment to Date   Retinopathy  []    [x]      Nephropathy  []    [x]      Neuropathy  []    [x]      Foot ulcer/amputation  []    []      Gastroparesis  []   []     Hypoglycemia Unawareness  []    []   DKA/HHS  []    []      Stroke  []    []      CAD  []    []      Peripheral Vascular Disease  []    []         SELF-MANAGEMENT:   See flowsheet     PAST MEDICAL HISTORY:     On File   PAST SURGICAL HISTORY :   On File   MEDICATIONS:        On File  Diabetes Insulin Regimen:   Insulin: Type [x]   Basal:  []  NPH [x]  Lantus    []  Toujeo []  Levemir  []  Evaristo Bury     Dose: 12 units QHS    [x]  Bolus:   []   Regular []  Novolog    [x]  Humalog   []  Humalog 200 []  Aprdra      Dose:      Self-adjusts dose? [] Y  [] N       Insulin-to-carb ratio: 1:10     Insulin sensitivity factor: 1:50>150      []  Fixed Regimen:  ALLERGIES:   Patient has no known allergies.  FAMILY HISTORY:   On File  SOCIAL HISTORY:    reports that he has never smoked. He has never used smokeless tobacco. He reports that he drinks about 4.2 oz of alcohol per week .  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  There is no height or weight on file to calculate BMI.  Wt Readings from Last 3 Encounters:   11/04/17 156 lb (70.8 kg)   11/02/17 152 lb 12.8 oz (69.3 kg)   09/03/17 158 lb (71.7 kg)      POCT:  blood sugar:       Mg/dL  Lab Results   Component Value Date    GLUCOSE 261 (H) 09/03/2017     Glucose, Manual   Date Value Ref Range Status   08/05/2017 416 (A) 65 - 100 mg/dL Final     Lab Results   Component Value Date    HGBA1C 8.1 (A) 11/02/2017    HGBA1C 8.7 (H) 09/03/2017    HGBA1C 9.1 (H) 12/24/2016     POCT A1c:   []   Up   []  down from --- LABS/STUDIES     Lab Results   Component Value Date    CREAT 0.86 09/03/2017    BUN 22 09/03/2017    NA 135 09/03/2017    K 4.5 09/03/2017    CL 99 09/03/2017    CO2 22 09/03/2017     Cholesterol   Date Value Ref Range Status   09/03/2017 182 See Comment mg/dL Final     Comment:       The significance of total cholesterol depends on the values of LDL, HDL, triglycerides and the clinical context. A patient-provider discussion may be considered.         Cholesterol, HDL   Date Value Ref Range Status   09/03/2017 94 >40 mg/dL Final     Comment:     If HDL cholesterol level falls outside of the designated  range, a patient-provider discussion is recommended     Triglycerides   Date Value Ref Range Status   09/03/2017 81 <150  mg/dL Final     Comment:     Patient is non-fasting, interpret with caution.    If Triglyceride level falls outside of the designated range,  a patient-provider discussion is recommended.       Non-HDL,Chol,Calc   Date Value Ref Range Status   09/03/2017 88 <130 mg/dL Final     Comment:     If Non-HDL cholesterol level falls outside of the designated  range, a patient-provider discussion is recommended.     Albumin/Creat Ratio   Date Value Ref Range Status   09/03/2017  <30.0 mcg/mg Final     Comment:     Unable to calculate. Test result is???below detection???limit.   05/29/2016  <30.0 mcg/mg Final     Comment:     Unable to calculate. Test result is???below detection???limit.   10/23/2015  <30.0 mcg/mg Final     Comment:     Test not performed. Unable to calculate.     Lab Results   Component Value Date/Time    CPEPTIDE <0.2 (L) 09/03/2017 12:11 PM       ASSESSMENT    Antiono [name] is a 72 y.o. male with a h/o Diabetes Mellitus type 1,  without complications under inadequate control with recent A1C at 8.1% and average BG from meter at 236 +/- 110 mg/dL. Patient presents today with his 2 daughters. Trying to get dexcom G6 but not sure status; advised to contact RoseMary. Patient and daughters requiring comprehensive review of basal bolus insulin and mechanism of action of both. Patient sometimes having low BG levels r/t correcting high BG level too soon and not giving specific doses and high readings often r/t eating carbs when in low end target to prevent hypoglycemia without insulin coverage or having carb snacks without coverage. Provided diabetes education and recommendations as per below with goal to try and use more precise method of dosing to prevent very high and low readings.      []  Retinopathy []    NPR[]     PDR  []  Nephropathy  []  Neuropathy []    peripheral []    Autonomic   []  HTN  []  Hyperlipidemia  []  Obesity       DM Self-management behaviors :.[]   Excellent  []   Good [x]   Fair []   Poor    Readiness to learn: .[]   Excellent  [x]   Good []   Fair []   poor    PLAN   []  Continue all other Rx as per prescribing physician(s).  []  Continue current diabetes []  oral medication:  []   insulin regimen:.  []  Make the following changes:   Patient Instructions   --Call RoseMary   Dexcom contacts:   Technical Support: [phone]  Questions about Dexcom: RoseMary Evans [phone]    --Goal is to try and count carbohydrates and use a carbohydrate ratio and correction dose and continue lantus   --1 unit for every 10 grams of carbohydrates    --1 unit for every 50 ''points'' over 150 mg/dL    --continue 12 units of Lantus     --Correction chart:   150-200: 1 unit  201-250: 2 units  251-300: 3 units  301-350: 4 units  351-400: 5 units  401-450: 6 units    --Only correct for a high blood sugar if 4 hours has gone by     --Try eating non-carb snacks if you are not going to give insulin    --If BG is under 100, eat 10 grams of carbs to  maintain your blood sugar at work       Treating Low Blood Sugar (Hypoglycemia)    If you are feeling symptoms of low blood sugar, check blood sugar  ? Shaky/weak/clammy  ? Sweaty/flushed/hot   ? Dizzy/headache  ? Hungry ? Blurred vision/glassy eyes  ? Tired/drowsy  ? Slurred/garbled speech  ? Mood/behavior change  ? Inattentive/spacey    If your blood sugar is low (below 70 mg/dL) remember the Rule of 15:    2. Eat/drink 15 grams of fast-acting carbohydrate (simple sugar)  ? ??? cup (4 oz) fruit juice  ? 3-4 glucose tablets (or dextrose tablets)  ? 5-6 oz (about ??? can) regular soda, such as regular Coke or Sprite  ? 8 regular Life Savers (NOT sugar free)  ? 1 Tablespoon sugar, honey, jam    3. Rest for 15 minutes, then re-check your blood sugar    4. If your blood sugar is still low, repeat Step #1  ? Continue until blood sugar is over 100 mg/dL    5. If your next meal is more than an hour away eat a snack with carbohydrate (starch) and protein or fat to keep your blood sugar from going low again:  ? ??? Sandwich  ? Crackers with cheese    6. If you can???t figure out why your blood sugar was low, call your healthcare provider - your medication may need to be adjusted    7. ALWAYS carry fast-acting carbohydrate (simple sugar) to treat a low blood sugar                 Prescription Given for:   []  Insulin pen: []  Insulin vial: []  Syringes:  []  Pod device     []  Test strips: []  Lancets: []  Glucagon     Lab ordered:   []  POCT A1c  []    POCT Glucose:    []      Counseling/Self-Management Training:      See flowsheet    Patient verbalized understanding of the topics discussed below:             The above recommendations were discussed with the patient who participated in self directed goals and expressed agreement with the recommended diabetes self management plan.  All the patient 's questions were answered satisfactorily.      Follow-Up in:   1 week.     Reviewed precautions/indications (hypo/hyperglycemia sx) to contact office and/or RTC.      Prakash Kimberling will bring: []  Meter   [] SMBG record   []  Food Diary   []  Significant Other       Thank you for  allowing me to participate in the care of your patient Author:  Kandice Robinsons, RN, BSN, CDE 11/08/2017 9:22 AM

## 2017-11-08 NOTE — Patient Instructions
--  Call Chad   Dexcom contacts:   Technical Support: [phone]  Questions about Dexcom: Chad Watkins [phone]    --Goal is to try and count carbohydrates and use a carbohydrate ratio and correction dose and continue lantus   --1 unit for every 10 grams of carbohydrates    --1 unit for every 50 ''points'' over 150 mg/dL    --continue 12 units of Lantus     --Correction chart:   150-200: 1 unit  201-250: 2 units  251-300: 3 units  301-350: 4 units  351-400: 5 units  401-450: 6 units    --Only correct for a high blood sugar if 4 hours has gone by     --Try eating non-carb snacks if you are not going to give insulin    --If BG is under 100, eat 10 grams of carbs to maintain your blood sugar at work       Treating Low Blood Sugar (Hypoglycemia)    If you are feeling symptoms of low blood sugar, check blood sugar  ? Shaky/weak/clammy  ? Sweaty/flushed/hot   ? Dizzy/headache  ? Hungry  ? Blurred vision/glassy eyes  ? Tired/drowsy  ? Slurred/garbled speech  ? Mood/behavior change  ? Inattentive/spacey    If your blood sugar is low (below 70 mg/dL) remember the Rule of 15:    2. Eat/drink 15 grams of fast-acting carbohydrate (simple sugar)  ? ??? cup (4 oz) fruit juice  ? 3-4 glucose tablets (or dextrose tablets)  ? 5-6 oz (about ??? can) regular soda, such as regular Coke or Sprite  ? 8 regular Life Savers (NOT sugar free)  ? 1 Tablespoon sugar, honey, jam    3. Rest for 15 minutes, then re-check your blood sugar    4. If your blood sugar is still low, repeat Step #1  ? Continue until blood sugar is over 100 mg/dL    5. If your next meal is more than an hour away eat a snack with carbohydrate (starch) and protein or fat to keep your blood sugar from going low again:  ? ??? Sandwich  ? Crackers with cheese    6. If you can???t figure out why your blood sugar was low, call your healthcare provider - your medication may need to be adjusted    7. ALWAYS carry fast-acting carbohydrate (simple sugar) to treat a low blood sugar

## 2017-11-09 NOTE — Telephone Encounter
Faxed back 10/21 ES

## 2017-11-11 ENCOUNTER — Ambulatory Visit: Payer: BLUE CROSS/BLUE SHIELD

## 2017-11-11 DIAGNOSIS — S86302D Unspecified injury of muscle(s) and tendon(s) of peroneal muscle group at lower leg level, left leg, subsequent encounter: Secondary | ICD-10-CM

## 2017-11-11 DIAGNOSIS — M25572 Pain in left ankle and joints of left foot: Secondary | ICD-10-CM

## 2017-11-11 DIAGNOSIS — S82832D Other fracture of upper and lower end of left fibula, subsequent encounter for closed fracture with routine healing: Secondary | ICD-10-CM

## 2017-11-11 NOTE — Progress Notes
PATIENT: Chad Watkins  MRN: [id number]  DOB: 01-04-1946  DATE OF SERVICE: 11/11/2017    CHIEF COMPLAINT:   Chief Complaint   Patient presents with   ??? date change on disability     Has worked since the date of my last note for some reason?  Needs one with todays date.  Pain about same, has not seen podiatry Jonette Pesa) nor started PT yet.      Past Medical History:   Diagnosis Date   ??? Diabetic peripheral vascular disease (HCC/RAF) 09/03/2017   ??? DM type 1 with diabetic mixed hyperlipidemia (HCC/RAF) 09/03/2017   ??? Hypertension associated with diabetes (HCC/RAF) 09/03/2017   ??? Peripheral vascular disease (HCC/RAF) 09/03/2017   ??? Type 1 diabetes mellitus with diabetic neuropathy, unspecified (HCC/RAF) 09/03/2017       No past surgical history on file.    History   Smoking Status   ??? Never Smoker   Smokeless Tobacco   ??? Never Used       No family history on file.    History   Alcohol Use   ??? 4.2 oz/week   ??? 7 Standard drinks or equivalent per week       History   Drug use: Unknown       No Known Allergies    Patient Active Problem List   Diagnosis   ??? Bursitis   ??? Skin cancer   ??? Hyperlipidemia   ??? Palpitation   ??? Need for vaccination   ??? Visit for preventive health examination   ??? Hypertension, well controlled   ??? Abnormal EKG   ??? Left ankle pain   ??? Hypertension associated with diabetes (HCC/RAF)   ??? DM type 1 with diabetic mixed hyperlipidemia (HCC/RAF)   ??? DM (diabetes mellitus), type 1, uncontrolled, periph vascular complic (HCC/RAF)   ??? Peripheral vascular disease (HCC/RAF)   ??? DM (diabetes mellitus), type 1, uncontrolled w/neurologic complication (HCC/RAF)         Current Outpatient Prescriptions:   ???  acetaminophen-codeine (TYLENOL #3) 300-30 mg tablet, TAKE 1 TABLET EVERY 4 HOURS AS NEEDED FOR PAIN, Disp: , Rfl: 0  ???  amLODIPine-benazepril 5-20 mg capsule, Take 1 capsule by mouth two (2) times daily., Disp: 60 capsule, Rfl: 3  ???  BD INSULIN SYRINGE U/F 31G X 5/16'' 0.3 ML syringe, INJECT 1 EACH UNDER THE SKIN FIVE (5) TIMES DAILY., Disp: 100 each, Rfl: 3  ???  CIALIS 5 MG tablet, TAKE 1 TABLET (5 MG TOTAL) BY MOUTH DAILY., Disp: 30 tablet, Rfl: 4  ???  ciprofloxacin 0.3% ophthalmic solution, 1 drop qid both eyes., Disp: 5 mL, Rfl: 0  ???  Continuous Blood Gluc Receiver (DEXCOM G6 RECEIVER) DEVI, 1 device by Does not apply route continuous., Disp: 1 device, Rfl: 1  ???  Continuous Blood Gluc Sensor (DEXCOM G6 SENSOR) MISC, Apply new sensor every 10 days., Disp: 9 each, Rfl: 3  ???  Continuous Blood Gluc Transmit (DEXCOM G6 TRANSMITTER) MISC, 1 device by Does not apply route continuous., Disp: 1 each, Rfl: 3  ???  Glucagon (BAQSIMI TWO PACK) 3 MG/DOSE POWD, Spray 3 mg by nasal route as needed for., Disp: 2 each, Rfl: 2  ???  glucose blood test strip, Use as instructed, Disp: 100 each, Rfl: 12  ???  HUMALOG 100 unit/mL injection vial, USE 6 UNITS SUBCUTANEOUSLY 3 TIMES DAILY BEFORE MEALS., Disp: 10 mL, Rfl: 2  ???  LANTUS 100 unit/mL injection vial, INJECT 0.2 MLS (20 UNITS TOTAL) UNDER  THE SKIN AT BEDTIME.., Disp: 20 mL, Rfl: 1  ???  metFORMIN (GLUCOPHAGE XR) 500 mg PO ER 24 hr tablet, Take 2 tablets (1,000 mg total) by mouth two (2) times daily with meals., Disp: 360 tablet, Rfl: 0  ???  ONETOUCH ULTRA BLUE test strip, USE 1 TEST STRIP TESTING 5 TIMES A DAY., Disp: 500 strip, Rfl: 3  ???  SHINGRIX 50 MCG/0.5ML injection, , Disp: , Rfl:   ???  SILDENAFIL 100 mg tablet, TAKE 1 TABLET (100 MG TOTAL) BY MOUTH AS NEEDED FOR FOR ERECTILE DYSFUNCTION., Disp: 10 tablet, Rfl: 4  ???  simvastatin (ZOCOR) 20 mg tablet, TOME UNA TABLETA DIARIAMENTE, Disp: 90 tablet, Rfl: 3  ???  SIMVASTATIN 20 mg tablet, TOME UNA TABLETA TODOS LOS DIAS AL ACOSTARSE, Disp: 90 tablet, Rfl: 0  ???  VIAGRA 100 MG tablet, TAKE AS DIRECTED, Disp: 10 each, Rfl: 2  ???  VIAGRA 100 MG tablet, TAKE 1 TABLET (100 MG TOTAL) BY MOUTH AS NEEDED FOR FOR ERECTILE DYSFUNCTION., Disp: 10 tablet, Rfl: 1    Health Maintenance   Topic Date Due   ??? Colon Ca Screening: COLONOSCOPY  05/20/1994 ??? Colon Ca Screening: FIT/FOBT  11/21/2015   ??? Annual Preventive Wellness Visit  05/29/2017   ??? Diabetes: HGB A1C  05/04/2018   ??? Diabetes: NEPHROPATHY MONITORING  09/04/2018   ??? Diabetes: FOOT EXAM  09/17/2018   ??? Diabetes: EYE EXAM  11/03/2018   ??? Tdap/Td Vaccine (2 - Td) 07/19/2022   ??? Influenza Vaccine  Completed   ??? Pneumococcal Vaccine  Completed   ??? Hepatitis C Screening  Completed   ??? Shingles (Shingrix) Vaccine  Completed   ??? Statin prescribed for ASCVD Prevention or Treatment  Completed       Patient Care Team:  Trena Platt., DO as PCP - General (Family Medicine)      Subjective:      Chad Watkins is a 72 y.o. male.    Review of Systems   Constitutional: Negative.  Negative for activity change, appetite change, chills, diaphoresis, fatigue, fever and unexpected weight change.   HENT: Negative.  Negative for congestion, dental problem, drooling, ear discharge, ear pain, facial swelling, hearing loss, mouth sores, nosebleeds, postnasal drip, rhinorrhea, sinus pressure, sneezing, sore throat, tinnitus, trouble swallowing and voice change.    Eyes: Negative.  Negative for photophobia, pain, discharge, redness, itching and visual disturbance.   Respiratory: Negative.  Negative for apnea, cough, choking, chest tightness, shortness of breath, wheezing and stridor.    Cardiovascular: Negative.  Negative for chest pain, palpitations and leg swelling.   Gastrointestinal: Negative.  Negative for abdominal distention, abdominal pain, anal bleeding, blood in stool, constipation, diarrhea, nausea, rectal pain and vomiting.   Endocrine: Negative.  Negative for cold intolerance, heat intolerance, polydipsia, polyphagia and polyuria.   Genitourinary: Negative.  Negative for decreased urine volume, difficulty urinating, discharge, dysuria, enuresis, flank pain, frequency, genital sores, hematuria, penile pain, penile swelling, scrotal swelling, testicular pain and urgency. Musculoskeletal: Positive for arthralgias, gait problem and joint swelling. Negative for back pain, myalgias, neck pain and neck stiffness.   Skin: Negative.  Negative for color change, pallor, rash and wound.   Allergic/Immunologic: Negative.  Negative for environmental allergies, food allergies and immunocompromised state.   Neurological: Negative for dizziness, tremors, seizures, syncope, facial asymmetry, speech difficulty, weakness, light-headedness, numbness and headaches.   Hematological: Negative.  Negative for adenopathy. Does not bruise/bleed easily.   Psychiatric/Behavioral: Negative.  Negative for agitation, behavioral problems,  confusion, decreased concentration, dysphoric mood, hallucinations, self-injury, sleep disturbance and suicidal ideas. The patient is not nervous/anxious and is not hyperactive.    All other systems reviewed and are negative.        Objective:      Physical Exam   Constitutional: He is oriented to person, place, and time. He appears well-developed and well-nourished. No distress.   BP 135/74  ~ Pulse 96  ~ Temp 36.9 ???C (98.4 ???F)  ~ Wt 154 lb (69.9 kg)  ~ BMI 25.63 kg/m???      HENT:   Head: Normocephalic and atraumatic.   Neck: Normal range of motion. Neck supple.   Pulmonary/Chest: Effort normal.   Musculoskeletal:   Slightly antalgic gait.  Has ankle brace on.     Neurological: He is alert and oriented to person, place, and time.   Skin: Skin is warm and dry.   Psychiatric: He has a normal mood and affect. His behavior is normal. Judgment and thought content normal.   Vitals reviewed.      ASSESSMENT AND PLAN     1. Hypertension associated with diabetes (HCC/RAF)    2. Left ankle pain, unspecified chronicity    3. Closed fracture of distal end of left fibula with routine healing, unspecified fracture morphology, subsequent encounter    4. Peroneal tendon injury, left, subsequent encounter      BP better control with change in meds but still not optimal.  DM control not optimal, followed by Endo.    Left ankle - cont brace, see Mejia and start PT.  New note given, ask HR regarding disabilty.      No orders of the defined types were placed in this encounter.      Wt Readings from Last 5 Encounters:   11/11/17 154 lb (69.9 kg)   11/04/17 156 lb (70.8 kg)   11/02/17 152 lb 12.8 oz (69.3 kg)   09/03/17 158 lb (71.7 kg)   08/05/17 159 lb (72.1 kg)     BP Readings from Last 5 Encounters:   11/11/17 135/74   11/04/17 143/66   11/02/17 171/78   09/03/17 144/76   08/05/17 152/83       Hgb A1c - HPLC   Date Value Ref Range Status   09/03/2017 8.7 (H) <5.7 % Final     Comment:     For patients with diabetes, an A1c less than (<) or equal (=) to 7.0% is recommended for most patients, however the goal may be higher or lower depending on age and/or other medical problems.   For a diagnosis of diabetes, A1c greater than (>) or equal(=) to 6.5% indicates diabetes; values between 5.7% and 6.4% may indicate an increased risk of developing diabetes.   12/24/2016 9.1 (H) <5.7 % Final     Comment:     For patients with diabetes, an A1c less than (<) or equal (=) to 7.0% is recommended for most patients, however the goal may be higher or lower depending on age and/or other medical problems.   For a diagnosis of diabetes, A1c greater than (>) or equal(=) to 6.5% indicates diabetes; values between 5.7% and 6.4% may indicate an increased risk of developing diabetes.   05/29/2016 7.9 (H) <5.7 % Final     Comment:     For patients with diabetes, an A1c less than (<) or equal (=) to 7.0% is recommended for most patients, however the goal may be higher or lower depending on age and/or other  medical problems.   For a diagnosis of diabetes, A1c greater than (>) or equal(=) to 6.5% indicates diabetes; values between 5.7% and 6.4% may indicate an increased risk of developing diabetes.     Hemoglobin A1C, Manual   Date Value Ref Range Status   11/02/2017 8.1 (A) 4 - 6.5 % Final     Cholesterol Date Value Ref Range Status   09/03/2017 182 See Comment mg/dL Final     Comment:       The significance of total cholesterol depends on the values of LDL, HDL, triglycerides and the clinical context. A patient-provider discussion may be considered.       05/29/2016 205 See Comment mg/dL Final     Comment:       The significance of total cholesterol depends on the values of LDL, HDL, triglycerides and the clinical context. A patient-provider discussion may be considered.       10/23/2015 187 See Comment mg/dL Final     Comment:       The significance of total cholesterol depends on the values of LDL, HDL, triglycerides and the clinical context. A patient-provider discussion may be considered.         Cholesterol,LDL,Calc   Date Value Ref Range Status   05/29/2016 74 <100 mg/dL Final     Comment:     If LDL value falls outside of the designated range AND if  included in any of the following categories, a  patient-provider discussion is recommended.     Statin therapy is recommended for individuals:  1. with clinical atherosclerotic cardiovascular disease     irrespective of LDL levels;  2. with LDL > or = 190 mg/dL;  3. with diabetes, aged 40-75 years, with LDL between 70 and     189 mg/dL;  4. without any of the above but who have LDL between 70 and     189 mg/dL and an estimated 04-VWUJ risk of     atherosclerotic cardiovascular disease > or = 7.5%     (consider statin therapy if estimated 10-year risk > or =     5.0%) (ACC/AHA 2013 Guidelines).   10/23/2015 61 <100 mg/dL Final     Comment:     If LDL value falls outside of the designated range AND if  included in any of the following categories, a  patient-provider discussion is recommended.     Statin therapy is recommended for individuals:  1. with clinical atherosclerotic cardiovascular disease     irrespective of LDL levels;  2. with LDL > or = 190 mg/dL;  3. with diabetes, aged 40-75 years, with LDL between 70 and     189 mg/dL; 4. without any of the above but who have LDL between 70 and     189 mg/dL and an estimated 81-XBJY risk of     atherosclerotic cardiovascular disease > or = 7.5%     (consider statin therapy if estimated 10-year risk > or =     5.0%) (ACC/AHA 2013 Guidelines).   10/19/2014 55 <100 mg/dL Final     Comment:     If LDL value falls outside of the designated range or if  included in any of the following categories, a  patient-provider discussion is recommended.     Statin therapy is recommended for individuals:  1. with clinical atherosclerotic cardiovascular disease     irrespective of LDL levels;  2. with LDL > or = 190 mg/dL;  3. with diabetes, aged 51-75  years, with LDL between 70 and     189 mg/dL;  4. without any of the above but who have LDL between 70 and     189 mg/dL and an estimated 16-XWRU risk of     atherosclerotic cardiovascular disease > or = 7.5%     (consider statin therapy if estimated 10-year risk > or =     5.0%) (ACC/AHA 2013 Guidelines).     Cholesterol, HDL   Date Value Ref Range Status   09/03/2017 94 >40 mg/dL Final     Comment:     If HDL cholesterol level falls outside of the designated  range, a patient-provider discussion is recommended   05/29/2016 120 >40 mg/dL Final     Comment:     If HDL cholesterol level falls outside of the designated  range, a patient-provider discussion is recommended   10/23/2015 115 >40 mg/dL Final     Comment:     If HDL cholesterol level falls outside of the designated  range, a patient-provider discussion is recommended     Triglycerides   Date Value Ref Range Status   09/03/2017 81 <150 mg/dL Final     Comment:     Patient is non-fasting, interpret with caution.    If Triglyceride level falls outside of the designated range,  a patient-provider discussion is recommended.     05/29/2016 54 <150 mg/dL Final     Comment:       If Triglyceride level falls outside of the designated range,  a patient-provider discussion is recommended. 10/23/2015 56 <150 mg/dL Final     Comment:       If Triglyceride level falls outside of the designated range,  a patient-provider discussion is recommended.        Creatinine   Date Value Ref Range Status   09/03/2017 0.86 0.60 - 1.30 mg/dL Final   04/54/0981 1.91 0.60 - 1.30 mg/dL Final   47/82/9562 1.30 0.60 - 1.30 mg/dL Final        PHQ-9 Results  Depression Screening (Patient Health Questionnaire PHQ) 08/18/2012 10/19/2014 10/19/2014 09/11/2015 12/24/2016 01/22/2017   PHQ-2: Feeling down, depressed, or hopeless - No No No No No   PHQ-2: Little interest or pleassure in doing things - No No No No No   Little interest or pleasure in doing things Not at all - - - - -   Feeling down, depressed, or hopeless Not at all - - - - -   PHQ-9 Total Score 0 - - - - -   Some recent data might be hidden       GAD-7 Results  No flowsheet data found.    DAST Results  No flowsheet data found.    Audit-C results  No flowsheet data found.      No Follow-up on file.  The above plan of care, diagnosis, orders, and follow-up were discussed with the patient.  Questions related to this recommended plan of care were answered.  Lonzo Cloud. Karolee Ohs, DO  11/11/2017      Author:  Lonzo Cloud. Lavoris Sparling 11/11/2017 10:46 AM

## 2017-11-13 MED ORDER — LANTUS 100 UNIT/ML SC SOLN
1 refills | Status: AC
Start: 2017-11-13 — End: 2018-09-20

## 2017-11-15 ENCOUNTER — Ambulatory Visit: Payer: BLUE CROSS/BLUE SHIELD

## 2017-11-28 MED ORDER — METFORMIN HCL ER 500 MG PO TB24
1000 mg | ORAL_TABLET | Freq: Two times a day (BID) | ORAL | 0 refills | Status: AC
Start: 2017-11-28 — End: 2018-03-17

## 2017-12-01 ENCOUNTER — Ambulatory Visit: Payer: BLUE CROSS/BLUE SHIELD

## 2017-12-01 DIAGNOSIS — M25572 Pain in left ankle and joints of left foot: Secondary | ICD-10-CM

## 2017-12-01 DIAGNOSIS — Z8781 Personal history of (healed) traumatic fracture: Secondary | ICD-10-CM

## 2017-12-01 NOTE — Progress Notes
PATIENT: Chad Watkins  MRN: [id number]  DOB: 1945-11-08  DATE OF SERVICE: 12/01/2017    CHIEF COMPLAINT:   Chief Complaint   Patient presents with   ??? Forms     Patient requesting disability forms to be filled out for work due to chronic pain of left ankle   ??? Ankle Pain     hx of fracture about 12-18 months ago and continuing pain despite walking boot and work up.  though inconsistent PT.       Past Medical History:   Diagnosis Date   ??? Diabetic peripheral vascular disease (HCC/RAF) 09/03/2017   ??? DM type 1 with diabetic mixed hyperlipidemia (HCC/RAF) 09/03/2017   ??? History of fracture of left ankle 12/01/2017   ??? Hypertension associated with diabetes (HCC/RAF) 09/03/2017   ??? Peripheral vascular disease (HCC/RAF) 09/03/2017   ??? Type 1 diabetes mellitus with diabetic neuropathy, unspecified (HCC/RAF) 09/03/2017       No past surgical history on file.    Social History     Tobacco Use   Smoking Status Never Smoker   Smokeless Tobacco Never Used       No family history on file.    Social History     Substance and Sexual Activity   Alcohol Use Yes   ??? Alcohol/week: 4.2 oz   ??? Types: 7 Standard drinks or equivalent per week       Social History     Substance and Sexual Activity   Drug Use Not on file       No Known Allergies    Patient Active Problem List   Diagnosis   ??? Bursitis   ??? Skin cancer   ??? Hyperlipidemia   ??? Palpitation   ??? Need for vaccination   ??? Visit for preventive health examination   ??? Hypertension, well controlled   ??? Abnormal EKG   ??? Left ankle pain   ??? Hypertension associated with diabetes (HCC/RAF)   ??? DM type 1 with diabetic mixed hyperlipidemia (HCC/RAF)   ??? DM (diabetes mellitus), type 1, uncontrolled, periph vascular complic (HCC/RAF)   ??? Peripheral vascular disease (HCC/RAF)   ??? DM (diabetes mellitus), type 1, uncontrolled w/neurologic complication (HCC/RAF)   ??? History of fracture of left ankle         Current Outpatient Medications: ???  BD INSULIN SYRINGE U/F 31G X 5/16'' 0.3 ML syringe, INJECT 1 EACH UNDER THE SKIN FIVE (5) TIMES DAILY., Disp: 100 each, Rfl: 3  ???  glucose blood test strip, Use as instructed, Disp: 100 each, Rfl: 12  ???  HUMALOG 100 unit/mL injection vial, USE 6 UNITS SUBCUTANEOUSLY 3 TIMES DAILY BEFORE MEALS., Disp: 10 mL, Rfl: 2  ???  LANTUS 100 unit/mL injection vial, INJECT 0.2 MLS (20 UNITS TOTAL) UNDER THE SKIN AT BEDTIME.., Disp: 20 mL, Rfl: 1  ???  metFORMIN (GLUCOPHAGE XR) 500 mg PO ER 24 hr tablet, TAKE 2 TABLETS (1,000 MG TOTAL) BY MOUTH TWO (2) TIMES DAILY WITH MEALS., Disp: 360 tablet, Rfl: 0  ???  ONETOUCH ULTRA BLUE test strip, USE 1 TEST STRIP TESTING 5 TIMES A DAY., Disp: 500 strip, Rfl: 3  ???  simvastatin (ZOCOR) 20 mg tablet, TOME UNA TABLETA DIARIAMENTE, Disp: 90 tablet, Rfl: 3  ???  VIAGRA 100 MG tablet, TAKE AS DIRECTED, Disp: 10 each, Rfl: 2  ???  acetaminophen-codeine (TYLENOL #3) 300-30 mg tablet, TAKE 1 TABLET EVERY 4 HOURS AS NEEDED FOR PAIN, Disp: , Rfl: 0  ???  amLODIPine-benazepril  5-20 mg capsule, Take 1 capsule by mouth two (2) times daily., Disp: 60 capsule, Rfl: 3  ???  CIALIS 5 MG tablet, TAKE 1 TABLET (5 MG TOTAL) BY MOUTH DAILY. (Patient not taking: Reported on 12/01/2017), Disp: 30 tablet, Rfl: 4  ???  ciprofloxacin 0.3% ophthalmic solution, 1 drop qid both eyes. (Patient not taking: Reported on 12/01/2017.), Disp: 5 mL, Rfl: 0  ???  Continuous Blood Gluc Receiver (DEXCOM G6 RECEIVER) DEVI, 1 device by Does not apply route continuous. (Patient not taking: Reported on 12/01/2017.), Disp: 1 device, Rfl: 1  ???  Continuous Blood Gluc Sensor (DEXCOM G6 SENSOR) MISC, Apply new sensor every 10 days. (Patient not taking: Reported on 12/01/2017.), Disp: 9 each, Rfl: 3  ???  Continuous Blood Gluc Transmit (DEXCOM G6 TRANSMITTER) MISC, 1 device by Does not apply route continuous. (Patient not taking: Reported on 12/01/2017.), Disp: 1 each, Rfl: 3  ???  Glucagon (BAQSIMI TWO PACK) 3 MG/DOSE POWD, Spray 3 mg by nasal route as needed for. (Patient not taking: Reported on 12/01/2017.), Disp: 2 each, Rfl: 2  ???  LANTUS 100 unit/mL injection vial, INJECT 0.2 MLS (20 UNITS TOTAL) UNDER THE SKIN AT BEDTIME.., Disp: 20 mL, Rfl: 1  ???  SHINGRIX 50 MCG/0.5ML injection, , Disp: , Rfl:   ???  SILDENAFIL 100 mg tablet, TAKE 1 TABLET (100 MG TOTAL) BY MOUTH AS NEEDED FOR FOR ERECTILE DYSFUNCTION. (Patient not taking: Reported on 12/01/2017.), Disp: 10 tablet, Rfl: 4  ???  SIMVASTATIN 20 mg tablet, TOME UNA TABLETA TODOS LOS DIAS AL ACOSTARSE, Disp: 90 tablet, Rfl: 0  ???  VIAGRA 100 MG tablet, TAKE 1 TABLET (100 MG TOTAL) BY MOUTH AS NEEDED FOR FOR ERECTILE DYSFUNCTION., Disp: 10 tablet, Rfl: 1    Health Maintenance   Topic Date Due   ??? Colon Ca Screening: COLONOSCOPY  05/20/1994   ??? Colon Ca Screening: FIT/FOBT  11/21/2015   ??? Annual Preventive Wellness Visit  05/29/2017   ??? Diabetes: HGB A1C  05/04/2018   ??? Diabetes: NEPHROPATHY MONITORING  09/04/2018   ??? Diabetes: FOOT EXAM  09/17/2018   ??? Diabetes: EYE EXAM  11/03/2018   ??? Tdap/Td Vaccine (2 - Td) 07/19/2022   ??? Influenza Vaccine  Completed   ??? Pneumococcal Vaccine  Completed   ??? Hepatitis C Screening  Completed   ??? Shingles (Shingrix) Vaccine  Completed   ??? Statin prescribed for ASCVD Prevention or Treatment  Completed       Patient Care Team:  Trena Platt., DO as PCP - General (Family Medicine)      Subjective:      Chad Watkins is a 72 y.o. male.    Review of Systems   Constitutional: Negative.  Negative for activity change, appetite change, chills, diaphoresis, fatigue, fever and unexpected weight change.   HENT: Negative.  Negative for congestion, dental problem, drooling, ear discharge, ear pain, facial swelling, hearing loss, mouth sores, nosebleeds, postnasal drip, rhinorrhea, sinus pressure, sinus pain, sneezing, sore throat, tinnitus, trouble swallowing and voice change.    Eyes: Negative.  Negative for photophobia, pain, discharge, redness, itching and visual disturbance. Respiratory: Negative.  Negative for apnea, cough, choking, chest tightness, shortness of breath, wheezing and stridor.    Cardiovascular: Negative.  Negative for chest pain, palpitations and leg swelling.   Gastrointestinal: Negative.  Negative for abdominal distention, abdominal pain, anal bleeding, blood in stool, constipation, diarrhea, nausea, rectal pain and vomiting.   Endocrine: Negative.  Negative for cold intolerance, heat  intolerance, polydipsia, polyphagia and polyuria.   Genitourinary: Negative.  Negative for decreased urine volume, difficulty urinating, discharge, dysuria, enuresis, flank pain, frequency, genital sores, hematuria, penile pain, penile swelling, scrotal swelling, testicular pain and urgency.   Musculoskeletal: Positive for arthralgias and gait problem. Negative for back pain, joint swelling, myalgias, neck pain and neck stiffness.   Skin: Negative.  Negative for color change, pallor, rash and wound.   Allergic/Immunologic: Negative.  Negative for environmental allergies, food allergies and immunocompromised state.   Neurological: Negative for dizziness, tremors, seizures, syncope, facial asymmetry, speech difficulty, weakness, light-headedness, numbness and headaches.   Hematological: Negative.  Negative for adenopathy. Does not bruise/bleed easily.   Psychiatric/Behavioral: Negative.  Negative for agitation, behavioral problems, confusion, decreased concentration, dysphoric mood, hallucinations, self-injury, sleep disturbance and suicidal ideas. The patient is not nervous/anxious and is not hyperactive.    All other systems reviewed and are negative.        Objective:      Physical Exam  Vitals signs reviewed.   Constitutional:       General: He is not in acute distress.     Appearance: Normal appearance. He is well-developed and normal weight. He is not ill-appearing, toxic-appearing or diaphoretic.      Comments: BP 122/63  ~ Pulse 77  ~ Temp 36.6 ???C (97.9 ???F) (Oral)  ~ Wt 154 lb (69.9 kg)  ~ BMI 25.63 kg/m???      HENT:      Head: Normocephalic and atraumatic.      Right Ear: External ear normal.      Left Ear: External ear normal.      Nose: Nose normal.   Eyes:      Conjunctiva/sclera: Conjunctivae normal.   Neck:      Musculoskeletal: Normal range of motion and neck supple. No neck rigidity or muscular tenderness.   Pulmonary:      Effort: Pulmonary effort is normal.   Lymphadenopathy:      Cervical: No cervical adenopathy.   Skin:     General: Skin is warm and dry.   Neurological:      General: No focal deficit present.      Mental Status: He is alert and oriented to person, place, and time.   Psychiatric:         Mood and Affect: Mood normal.         Behavior: Behavior normal.         Thought Content: Thought content normal.         Judgment: Judgment normal.         ASSESSMENT AND PLAN     1. Left ankle pain, unspecified chronicity    2. History of fracture of left ankle      Reiterated need for PT.  In meantime do home ''ABC's'' with foot and calf raises.  Has f/u with podiatry Dr. Jonette Pesa tomorrow.  MRI showed healing fx w/o complication but some damage to interosseus ligament that will take time to heal.      No orders of the defined types were placed in this encounter.      Wt Readings from Last 5 Encounters:   12/01/17 154 lb (69.9 kg)   11/11/17 154 lb (69.9 kg)   11/04/17 156 lb (70.8 kg)   11/02/17 152 lb 12.8 oz (69.3 kg)   09/03/17 158 lb (71.7 kg)     BP Readings from Last 5 Encounters:   12/01/17 122/63   11/11/17 135/74   11/04/17  143/66   11/02/17 171/78   09/03/17 144/76       Hgb A1c - HPLC   Date Value Ref Range Status   09/03/2017 8.7 (H) <5.7 % Final     Comment:     For patients with diabetes, an A1c less than (<) or equal (=) to 7.0% is recommended for most patients, however the goal may be higher or lower depending on age and/or other medical problems.   For a diagnosis of diabetes, A1c greater than (>) or equal(=) to 6.5% indicates diabetes; values between 5.7% and 6.4% may indicate an increased risk of developing diabetes.   12/24/2016 9.1 (H) <5.7 % Final     Comment:     For patients with diabetes, an A1c less than (<) or equal (=) to 7.0% is recommended for most patients, however the goal may be higher or lower depending on age and/or other medical problems.   For a diagnosis of diabetes, A1c greater than (>) or equal(=) to 6.5% indicates diabetes; values between 5.7% and 6.4% may indicate an increased risk of developing diabetes.   05/29/2016 7.9 (H) <5.7 % Final     Comment:     For patients with diabetes, an A1c less than (<) or equal (=) to 7.0% is recommended for most patients, however the goal may be higher or lower depending on age and/or other medical problems.   For a diagnosis of diabetes, A1c greater than (>) or equal(=) to 6.5% indicates diabetes; values between 5.7% and 6.4% may indicate an increased risk of developing diabetes.     Hemoglobin A1C, Manual   Date Value Ref Range Status   11/02/2017 8.1 (A) 4 - 6.5 % Final     Cholesterol   Date Value Ref Range Status   09/03/2017 182 See Comment mg/dL Final     Comment:       The significance of total cholesterol depends on the values of LDL, HDL, triglycerides and the clinical context. A patient-provider discussion may be considered.       05/29/2016 205 See Comment mg/dL Final     Comment:       The significance of total cholesterol depends on the values of LDL, HDL, triglycerides and the clinical context. A patient-provider discussion may be considered.       10/23/2015 187 See Comment mg/dL Final     Comment:       The significance of total cholesterol depends on the values of LDL, HDL, triglycerides and the clinical context. A patient-provider discussion may be considered.         Cholesterol,LDL,Calc   Date Value Ref Range Status   05/29/2016 74 <100 mg/dL Final     Comment:     If LDL value falls outside of the designated range AND if included in any of the following categories, a  patient-provider discussion is recommended.     Statin therapy is recommended for individuals:  1. with clinical atherosclerotic cardiovascular disease     irrespective of LDL levels;  2. with LDL > or = 190 mg/dL;  3. with diabetes, aged 40-75 years, with LDL between 70 and     189 mg/dL;  4. without any of the above but who have LDL between 70 and     189 mg/dL and an estimated 16-XWRU risk of     atherosclerotic cardiovascular disease > or = 7.5%     (consider statin therapy if estimated 10-year risk > or =  5.0%) (ACC/AHA 2013 Guidelines).   10/23/2015 61 <100 mg/dL Final     Comment:     If LDL value falls outside of the designated range AND if  included in any of the following categories, a  patient-provider discussion is recommended.     Statin therapy is recommended for individuals:  1. with clinical atherosclerotic cardiovascular disease     irrespective of LDL levels;  2. with LDL > or = 190 mg/dL;  3. with diabetes, aged 40-75 years, with LDL between 70 and     189 mg/dL;  4. without any of the above but who have LDL between 70 and     189 mg/dL and an estimated 04-VWUJ risk of     atherosclerotic cardiovascular disease > or = 7.5%     (consider statin therapy if estimated 10-year risk > or =     5.0%) (ACC/AHA 2013 Guidelines).   10/19/2014 55 <100 mg/dL Final     Comment:     If LDL value falls outside of the designated range or if  included in any of the following categories, a  patient-provider discussion is recommended.     Statin therapy is recommended for individuals:  1. with clinical atherosclerotic cardiovascular disease     irrespective of LDL levels;  2. with LDL > or = 190 mg/dL;  3. with diabetes, aged 40-75 years, with LDL between 70 and     189 mg/dL;  4. without any of the above but who have LDL between 70 and     189 mg/dL and an estimated 81-XBJY risk of     atherosclerotic cardiovascular disease > or = 7.5% (consider statin therapy if estimated 10-year risk > or =     5.0%) (ACC/AHA 2013 Guidelines).     Cholesterol, HDL   Date Value Ref Range Status   09/03/2017 94 >40 mg/dL Final     Comment:     If HDL cholesterol level falls outside of the designated  range, a patient-provider discussion is recommended   05/29/2016 120 >40 mg/dL Final     Comment:     If HDL cholesterol level falls outside of the designated  range, a patient-provider discussion is recommended   10/23/2015 115 >40 mg/dL Final     Comment:     If HDL cholesterol level falls outside of the designated  range, a patient-provider discussion is recommended     Triglycerides   Date Value Ref Range Status   09/03/2017 81 <150 mg/dL Final     Comment:     Patient is non-fasting, interpret with caution.    If Triglyceride level falls outside of the designated range,  a patient-provider discussion is recommended.     05/29/2016 54 <150 mg/dL Final     Comment:       If Triglyceride level falls outside of the designated range,  a patient-provider discussion is recommended.     10/23/2015 56 <150 mg/dL Final     Comment:       If Triglyceride level falls outside of the designated range,  a patient-provider discussion is recommended.        Creatinine   Date Value Ref Range Status   09/03/2017 0.86 0.60 - 1.30 mg/dL Final   78/29/5621 3.08 0.60 - 1.30 mg/dL Final   65/78/4696 2.95 0.60 - 1.30 mg/dL Final        PHQ-9 Results  Depression Screening (Patient Health Questionnaire PHQ) 08/18/2012 10/19/2014 10/19/2014 09/11/2015 12/24/2016 01/22/2017  PHQ-2: Feeling down, depressed, or hopeless - No No No No No   PHQ-2: Little interest or pleassure in doing things - No No No No No   Little interest or pleasure in doing things Not at all - - - - -   Feeling down, depressed, or hopeless Not at all - - - - -   PHQ-9 Total Score 0 - - - - -   Some recent data might be hidden       GAD-7 Results  No flowsheet data found.    DAST Results  No flowsheet data found. Audit-C results  No flowsheet data found.      No follow-ups on file.  The above plan of care, diagnosis, orders, and follow-up were discussed with the patient.  Questions related to this recommended plan of care were answered.  Lonzo Cloud. Karolee Ohs, DO  12/01/2017      Author:  Lonzo Cloud. Khamia Stambaugh 12/01/2017 1:08 PM

## 2017-12-02 ENCOUNTER — Ambulatory Visit: Payer: BLUE CROSS/BLUE SHIELD

## 2017-12-03 ENCOUNTER — Telehealth: Payer: BLUE CROSS/BLUE SHIELD

## 2017-12-03 NOTE — Telephone Encounter
lmom informing EDD paperwork has been completed. Ready for pick up. Copy made.

## 2017-12-06 ENCOUNTER — Ambulatory Visit: Payer: BLUE CROSS/BLUE SHIELD | Attending: "Endocrinology

## 2017-12-06 DIAGNOSIS — S82892A Other fracture of left lower leg, initial encounter for closed fracture: Secondary | ICD-10-CM

## 2017-12-06 DIAGNOSIS — M7662 Achilles tendinitis, left leg: Secondary | ICD-10-CM

## 2017-12-06 DIAGNOSIS — M25572 Pain in left ankle and joints of left foot: Secondary | ICD-10-CM

## 2017-12-06 DIAGNOSIS — G8929 Other chronic pain: Secondary | ICD-10-CM

## 2017-12-07 NOTE — Progress Notes
Subjective    Chad Watkins is here to follow up ankle fracture 1 year ago that has not healed.  he states the condition has significantly improved with the use of the boot.  he is here for further evaluation.    No recent changes in [facility] or [facility].    Past Medical History:   Diagnosis Date   ??? Diabetic peripheral vascular disease (HCC/RAF) 09/03/2017   ??? DM type 1 with diabetic mixed hyperlipidemia (HCC/RAF) 09/03/2017   ??? History of fracture of left ankle 12/01/2017   ??? Hypertension associated with diabetes (HCC/RAF) 09/03/2017   ??? Peripheral vascular disease (HCC/RAF) 09/03/2017   ??? Type 1 diabetes mellitus with diabetic neuropathy, unspecified (HCC/RAF) 09/03/2017       Outpatient Medications Prior to Visit   Medication Sig   ??? acetaminophen-codeine (TYLENOL #3) 300-30 mg tablet TAKE 1 TABLET EVERY 4 HOURS AS NEEDED FOR PAIN   ??? amLODIPine-benazepril 5-20 mg capsule Take 1 capsule by mouth two (2) times daily.   ??? BD INSULIN SYRINGE U/F 31G X 5/16'' 0.3 ML syringe INJECT 1 EACH UNDER THE SKIN FIVE (5) TIMES DAILY.   ??? CIALIS 5 MG tablet TAKE 1 TABLET (5 MG TOTAL) BY MOUTH DAILY. (Patient not taking: Reported on 12/01/2017)   ??? ciprofloxacin 0.3% ophthalmic solution 1 drop qid both eyes. (Patient not taking: Reported on 12/01/2017.)   ??? Continuous Blood Gluc Receiver (DEXCOM G6 RECEIVER) DEVI 1 device by Does not apply route continuous. (Patient not taking: Reported on 12/01/2017.)   ??? Continuous Blood Gluc Sensor (DEXCOM G6 SENSOR) MISC Apply new sensor every 10 days. (Patient not taking: Reported on 12/01/2017.)   ??? Continuous Blood Gluc Transmit (DEXCOM G6 TRANSMITTER) MISC 1 device by Does not apply route continuous. (Patient not taking: Reported on 12/01/2017.)   ??? Glucagon (BAQSIMI TWO PACK) 3 MG/DOSE POWD Spray 3 mg by nasal route as needed for. (Patient not taking: Reported on 12/01/2017.)   ??? glucose blood test strip Use as instructed   ??? HUMALOG 100 unit/mL injection vial USE 6 UNITS SUBCUTANEOUSLY 3 TIMES DAILY BEFORE MEALS.   ??? LANTUS 100 unit/mL injection vial INJECT 0.2 MLS (20 UNITS TOTAL) UNDER THE SKIN AT BEDTIME.Marland Kitchen   ??? LANTUS 100 unit/mL injection vial INJECT 0.2 MLS (20 UNITS TOTAL) UNDER THE SKIN AT BEDTIME.Marland Kitchen   ??? metFORMIN (GLUCOPHAGE XR) 500 mg PO ER 24 hr tablet TAKE 2 TABLETS (1,000 MG TOTAL) BY MOUTH TWO (2) TIMES DAILY WITH MEALS.   ??? ONETOUCH ULTRA BLUE test strip USE 1 TEST STRIP TESTING 5 TIMES A DAY.   ??? SHINGRIX 50 MCG/0.5ML injection    ??? SILDENAFIL 100 mg tablet TAKE 1 TABLET (100 MG TOTAL) BY MOUTH AS NEEDED FOR FOR ERECTILE DYSFUNCTION. (Patient not taking: Reported on 12/01/2017.)   ??? simvastatin (ZOCOR) 20 mg tablet TOME UNA TABLETA DIARIAMENTE   ??? SIMVASTATIN 20 mg tablet TOME UNA TABLETA TODOS LOS DIAS AL ACOSTARSE   ??? VIAGRA 100 MG tablet TAKE AS DIRECTED   ??? VIAGRA 100 MG tablet TAKE 1 TABLET (100 MG TOTAL) BY MOUTH AS NEEDED FOR FOR ERECTILE DYSFUNCTION.     No facility-administered medications prior to visit.      PHYSICAL EXAM:  GEN: no acute distress alert/oriented x3 appropriate mood and affect looks stated age well nourished  VASC:  Dorsalis Pedis:  present  Posterior Tibial:  present  There is brisk capillary refill time to all digits less than 3 seconds.     DERM:  edema  moderate Left , ecchymosis is not observed, Erythema is none.  NEURO:  protective sensation is unchanged from previous visit.  MSK:  Muscle strength 5/5 in all 4 quadrants.    Ankle ROM is normal, Subtalar joint ROM is normal.  There is tenderness to palpation of the L ankle at the fibula.      Assessment /Plan   Chad Watkins is a 72 y.o. year old male who presents with:  1. Closed fracture of left ankle, initial encounter     2. Chronic pain of left ankle     3. Tendonitis, Achilles, left       Discussed findings with pt  Discussed pros and cons of surgical correction vs conservative treatment  Pt understands there is no guarantee of surgical outcomes he understands there is risk of surgical infection, recurrence of the condition, worsening of the condition or failure of the procedure.  Will continue with bone stimulator  Remain in bk walker  All questions were answered to patients satisfaction and understanding  F/u 3 wks.        Seleny Allbright [name], DPM  12/06/2017

## 2017-12-15 NOTE — Progress Notes
PATIENT: Chad Watkins  MRN: [id number]  DOB: 10/28/1945  DATE OF SERVICE: 11/04/2017    CHIEF COMPLAINT:   Chief Complaint   Patient presents with   ??? Follow-up   1) CHRONIC ANKLE PAIN AFTER FX 1 TO 1.5 YRS AGO. SOME CHRONIC CHANGES SEEN ON MRI.  SEEING PODIATRY.  BUT HAS BEEN TRAVELLING A LOT AND UNABLE TO GET PROPER PT BUT WILL BE HERE MORE NOW  2) EAR PRESSURE AND REDUCED HEARING    Subjective:      Chad Watkins is a 72 y.o. male.    Review of Systems   Constitutional: Negative.  Negative for activity change, appetite change, chills, diaphoresis, fatigue, fever and unexpected weight change.   HENT: Positive for ear discharge and hearing loss. Negative for congestion, dental problem, drooling, ear pain, facial swelling, mouth sores, nosebleeds, postnasal drip, rhinorrhea, sinus pressure, sinus pain, sneezing, sore throat, tinnitus, trouble swallowing and voice change.    Eyes: Negative.  Negative for photophobia, pain, discharge, redness, itching and visual disturbance.   Respiratory: Negative.  Negative for apnea, cough, choking, chest tightness, shortness of breath, wheezing and stridor.    Cardiovascular: Negative.  Negative for chest pain, palpitations and leg swelling.   Gastrointestinal: Negative.  Negative for abdominal distention, abdominal pain, anal bleeding, blood in stool, constipation, diarrhea, nausea, rectal pain and vomiting.   Endocrine: Negative.  Negative for cold intolerance, heat intolerance, polydipsia, polyphagia and polyuria.   Genitourinary: Negative.  Negative for decreased urine volume, difficulty urinating, discharge, dysuria, enuresis, flank pain, frequency, genital sores, hematuria, penile pain, penile swelling, scrotal swelling, testicular pain and urgency.   Musculoskeletal: Positive for arthralgias, gait problem and joint swelling. Negative for back pain, myalgias, neck pain and neck stiffness.   Skin: Negative.  Negative for color change, pallor, rash and wound. Allergic/Immunologic: Negative.  Negative for environmental allergies, food allergies and immunocompromised state.   Neurological: Negative for dizziness, tremors, seizures, syncope, facial asymmetry, speech difficulty, weakness, light-headedness, numbness and headaches.   Hematological: Negative.  Negative for adenopathy. Does not bruise/bleed easily.   Psychiatric/Behavioral: Negative.  Negative for agitation, behavioral problems, confusion, decreased concentration, dysphoric mood, hallucinations, self-injury, sleep disturbance and suicidal ideas. The patient is not nervous/anxious and is not hyperactive.    All other systems reviewed and are negative.        Objective:      Physical Exam  Vitals signs reviewed.   Constitutional:       General: He is not in acute distress.     Appearance: Normal appearance. He is well-developed and normal weight. He is not ill-appearing, toxic-appearing or diaphoretic.      Comments: BP 143/66  ~ Pulse 63  ~ Temp 36.1 ???C (97 ???F) (Tympanic)  ~ Wt 156 lb (70.8 kg)  ~ BMI 25.96 kg/m???      HENT:      Head: Normocephalic and atraumatic.      Right Ear: Tympanic membrane, ear canal and external ear normal. There is no impacted cerumen.      Left Ear: Tympanic membrane, ear canal and external ear normal. There is no impacted cerumen.      Nose: Nose normal. No congestion or rhinorrhea.      Mouth/Throat:      Mouth: Mucous membranes are moist.      Pharynx: Oropharynx is clear. No oropharyngeal exudate or posterior oropharyngeal erythema.   Eyes:      General:         Right  eye: No discharge.         Left eye: No discharge.      Conjunctiva/sclera: Conjunctivae normal.   Neck:      Musculoskeletal: Normal range of motion and neck supple. No neck rigidity or muscular tenderness.   Pulmonary:      Effort: Pulmonary effort is normal. No respiratory distress.      Breath sounds: Normal breath sounds. No stridor. No wheezing, rhonchi or rales.   Chest:      Chest wall: No tenderness. Lymphadenopathy:      Cervical: No cervical adenopathy.   Skin:     General: Skin is warm and dry.   Neurological:      General: No focal deficit present.      Mental Status: He is alert and oriented to person, place, and time.   Psychiatric:         Mood and Affect: Mood normal.         Behavior: Behavior normal.         Thought Content: Thought content normal.         Judgment: Judgment normal.         ASSESSMENT AND PLAN     1. Closed nondisplaced fracture of lateral malleolus of left fibula with routine healing, subsequent encounter    2. Peroneal tendon injury, left, subsequent encounter    3. Bilateral impacted cerumen      CERUMEN SUCCESSFULLY REMOVED AND PT TOLERATED WELL  PHYSICAL THERAPY FOR ANKLE CONCERN    Orders Placed This Encounter   ??? Referral to Rehabilitation, Physical Therapy   ??? TBOC - Ear Wax Removal, Lavage, Bilateral   ??? SHINGRIX 50 MCG/0.5ML injection   ??? amLODIPine-benazepril 5-20 mg capsule       Wt Readings from Last 5 Encounters:   12/01/17 154 lb (69.9 kg)   11/11/17 154 lb (69.9 kg)   11/04/17 156 lb (70.8 kg)   11/02/17 152 lb 12.8 oz (69.3 kg)   09/03/17 158 lb (71.7 kg)     BP Readings from Last 5 Encounters:   12/01/17 122/63   11/11/17 135/74   11/04/17 143/66   11/02/17 171/78   09/03/17 144/76       Hgb A1c - HPLC   Date Value Ref Range Status   09/03/2017 8.7 (H) <5.7 % Final     Comment:     For patients with diabetes, an A1c less than (<) or equal (=) to 7.0% is recommended for most patients, however the goal may be higher or lower depending on age and/or other medical problems.   For a diagnosis of diabetes, A1c greater than (>) or equal(=) to 6.5% indicates diabetes; values between 5.7% and 6.4% may indicate an increased risk of developing diabetes.   12/24/2016 9.1 (H) <5.7 % Final     Comment:     For patients with diabetes, an A1c less than (<) or equal (=) to 7.0% is recommended for most patients, however the goal may be higher or lower depending on age and/or other medical problems.   For a diagnosis of diabetes, A1c greater than (>) or equal(=) to 6.5% indicates diabetes; values between 5.7% and 6.4% may indicate an increased risk of developing diabetes.   05/29/2016 7.9 (H) <5.7 % Final     Comment:     For patients with diabetes, an A1c less than (<) or equal (=) to 7.0% is recommended for most patients, however the goal may be higher or lower  depending on age and/or other medical problems.   For a diagnosis of diabetes, A1c greater than (>) or equal(=) to 6.5% indicates diabetes; values between 5.7% and 6.4% may indicate an increased risk of developing diabetes.     Hemoglobin A1C, Manual   Date Value Ref Range Status   11/02/2017 8.1 (A) 4 - 6.5 % Final     Cholesterol   Date Value Ref Range Status   09/03/2017 182 See Comment mg/dL Final     Comment:       The significance of total cholesterol depends on the values of LDL, HDL, triglycerides and the clinical context. A patient-provider discussion may be considered.       05/29/2016 205 See Comment mg/dL Final     Comment:       The significance of total cholesterol depends on the values of LDL, HDL, triglycerides and the clinical context. A patient-provider discussion may be considered.       10/23/2015 187 See Comment mg/dL Final     Comment:       The significance of total cholesterol depends on the values of LDL, HDL, triglycerides and the clinical context. A patient-provider discussion may be considered.         Cholesterol,LDL,Calc   Date Value Ref Range Status   05/29/2016 74 <100 mg/dL Final     Comment:     If LDL value falls outside of the designated range AND if  included in any of the following categories, a  patient-provider discussion is recommended.     Statin therapy is recommended for individuals:  1. with clinical atherosclerotic cardiovascular disease     irrespective of LDL levels;  2. with LDL > or = 190 mg/dL;  3. with diabetes, aged 40-75 years, with LDL between 70 and 189 mg/dL;  4. without any of the above but who have LDL between 70 and     189 mg/dL and an estimated 41-LKGM risk of     atherosclerotic cardiovascular disease > or = 7.5%     (consider statin therapy if estimated 10-year risk > or =     5.0%) (ACC/AHA 2013 Guidelines).   10/23/2015 61 <100 mg/dL Final     Comment:     If LDL value falls outside of the designated range AND if  included in any of the following categories, a  patient-provider discussion is recommended.     Statin therapy is recommended for individuals:  1. with clinical atherosclerotic cardiovascular disease     irrespective of LDL levels;  2. with LDL > or = 190 mg/dL;  3. with diabetes, aged 40-75 years, with LDL between 70 and     189 mg/dL;  4. without any of the above but who have LDL between 70 and     189 mg/dL and an estimated 01-UUVO risk of     atherosclerotic cardiovascular disease > or = 7.5%     (consider statin therapy if estimated 10-year risk > or =     5.0%) (ACC/AHA 2013 Guidelines).   10/19/2014 55 <100 mg/dL Final     Comment:     If LDL value falls outside of the designated range or if  included in any of the following categories, a  patient-provider discussion is recommended.     Statin therapy is recommended for individuals:  1. with clinical atherosclerotic cardiovascular disease     irrespective of LDL levels;  2. with LDL > or = 190 mg/dL;  3.  with diabetes, aged 40-75 years, with LDL between 70 and     189 mg/dL;  4. without any of the above but who have LDL between 70 and     189 mg/dL and an estimated 16-XWRU risk of     atherosclerotic cardiovascular disease > or = 7.5%     (consider statin therapy if estimated 10-year risk > or =     5.0%) (ACC/AHA 2013 Guidelines).     Cholesterol, HDL   Date Value Ref Range Status   09/03/2017 94 >40 mg/dL Final     Comment:     If HDL cholesterol level falls outside of the designated  range, a patient-provider discussion is recommended   05/29/2016 120 >40 mg/dL Final Comment:     If HDL cholesterol level falls outside of the designated  range, a patient-provider discussion is recommended   10/23/2015 115 >40 mg/dL Final     Comment:     If HDL cholesterol level falls outside of the designated  range, a patient-provider discussion is recommended     Triglycerides   Date Value Ref Range Status   09/03/2017 81 <150 mg/dL Final     Comment:     Patient is non-fasting, interpret with caution.    If Triglyceride level falls outside of the designated range,  a patient-provider discussion is recommended.     05/29/2016 54 <150 mg/dL Final     Comment:       If Triglyceride level falls outside of the designated range,  a patient-provider discussion is recommended.     10/23/2015 56 <150 mg/dL Final     Comment:       If Triglyceride level falls outside of the designated range,  a patient-provider discussion is recommended.        Creatinine   Date Value Ref Range Status   09/03/2017 0.86 0.60 - 1.30 mg/dL Final   04/54/0981 1.91 0.60 - 1.30 mg/dL Final   47/82/9562 1.30 0.60 - 1.30 mg/dL Final        PHQ-9 Results  Depression Screening (Patient Health Questionnaire PHQ) 08/18/2012 10/19/2014 10/19/2014 09/11/2015 12/24/2016 01/22/2017   PHQ-2: Feeling down, depressed, or hopeless - No No No No No   PHQ-2: Little interest or pleassure in doing things - No No No No No   Little interest or pleasure in doing things Not at all - - - - -   Feeling down, depressed, or hopeless Not at all - - - - -   Total Score 0 - - - - -   Some recent data might be hidden       GAD-7 Results  No flowsheet data found.    DAST Results  No flowsheet data found.    Audit-C results  No flowsheet data found.      No follow-ups on file.  The above plan of care, diagnosis, orders, and follow-up were discussed with the patient.  Questions related to this recommended plan of care were answered.  Lonzo Cloud. Karolee Ohs, DO  12/15/2017      Author:  Lonzo Cloud. Denia Mcvicar 12/15/2017 8:51 AM

## 2017-12-20 ENCOUNTER — Inpatient Hospital Stay: Payer: BLUE CROSS/BLUE SHIELD

## 2017-12-20 ENCOUNTER — Institutional Professional Consult (permissible substitution): Payer: BLUE CROSS/BLUE SHIELD

## 2017-12-20 DIAGNOSIS — Z01818 Encounter for other preprocedural examination: Secondary | ICD-10-CM

## 2017-12-20 DIAGNOSIS — E1159 Type 2 diabetes mellitus with other circulatory complications: Secondary | ICD-10-CM

## 2017-12-20 DIAGNOSIS — I10 Essential (primary) hypertension: Secondary | ICD-10-CM

## 2017-12-20 NOTE — Progress Notes
PATIENT: Chad Watkins  MRN: [id number]  DOB: November 04, 1945  DATE OF SERVICE: 12/20/2017    CHIEF COMPLAINT:   Chief Complaint   Patient presents with   ??? Pre-op Exam     [facility] 12/11     Doing well, no complaints    Past Medical History:   Diagnosis Date   ??? Diabetic peripheral vascular disease (HCC/RAF) 09/03/2017   ??? DM type 1 with diabetic mixed hyperlipidemia (HCC/RAF) 09/03/2017   ??? History of fracture of left ankle 12/01/2017   ??? Hypertension associated with diabetes (HCC/RAF) 09/03/2017   ??? Peripheral vascular disease (HCC/RAF) 09/03/2017   ??? Type 1 diabetes mellitus with diabetic neuropathy, unspecified (HCC/RAF) 09/03/2017       No past surgical history on file.    Social History     Tobacco Use   Smoking Status Never Smoker   Smokeless Tobacco Never Used       No family history on file.    Social History     Substance and Sexual Activity   Alcohol Use Yes   ??? Alcohol/week: 4.2 oz   ??? Types: 7 Standard drinks or equivalent per week       Social History     Substance and Sexual Activity   Drug Use Not on file       No Known Allergies    Patient Active Problem List   Diagnosis   ??? Bursitis   ??? Skin cancer   ??? Hyperlipidemia   ??? Palpitation   ??? Need for vaccination   ??? Visit for preventive health examination   ??? Hypertension, well controlled   ??? Abnormal EKG   ??? Left ankle pain   ??? Hypertension associated with diabetes (HCC/RAF)   ??? DM type 1 with diabetic mixed hyperlipidemia (HCC/RAF)   ??? DM (diabetes mellitus), type 1, uncontrolled, periph vascular complic (HCC/RAF)   ??? Peripheral vascular disease (HCC/RAF)   ??? DM (diabetes mellitus), type 1, uncontrolled w/neurologic complication (HCC/RAF)   ??? History of fracture of left ankle         Current Outpatient Medications:   ???  acetaminophen-codeine (TYLENOL #3) 300-30 mg tablet, TAKE 1 TABLET EVERY 4 HOURS AS NEEDED FOR PAIN, Disp: , Rfl: 0  ???  amLODIPine-benazepril 5-20 mg capsule, Take 1 capsule by mouth two (2) times daily., Disp: 60 capsule, Rfl: 3 ???  BD INSULIN SYRINGE U/F 31G X 5/16'' 0.3 ML syringe, INJECT 1 EACH UNDER THE SKIN FIVE (5) TIMES DAILY., Disp: 100 each, Rfl: 3  ???  CIALIS 5 MG tablet, TAKE 1 TABLET (5 MG TOTAL) BY MOUTH DAILY. (Patient not taking: Reported on 12/01/2017), Disp: 30 tablet, Rfl: 4  ???  ciprofloxacin 0.3% ophthalmic solution, 1 drop qid both eyes. (Patient not taking: Reported on 12/01/2017.), Disp: 5 mL, Rfl: 0  ???  Continuous Blood Gluc Receiver (DEXCOM G6 RECEIVER) DEVI, 1 device by Does not apply route continuous. (Patient not taking: Reported on 12/01/2017.), Disp: 1 device, Rfl: 1  ???  Continuous Blood Gluc Sensor (DEXCOM G6 SENSOR) MISC, Apply new sensor every 10 days. (Patient not taking: Reported on 12/01/2017.), Disp: 9 each, Rfl: 3  ???  Continuous Blood Gluc Transmit (DEXCOM G6 TRANSMITTER) MISC, 1 device by Does not apply route continuous. (Patient not taking: Reported on 12/01/2017.), Disp: 1 each, Rfl: 3  ???  Glucagon (BAQSIMI TWO PACK) 3 MG/DOSE POWD, Spray 3 mg by nasal route as needed for. (Patient not taking: Reported on 12/01/2017.), Disp: 2  each, Rfl: 2  ???  glucose blood test strip, Use as instructed, Disp: 100 each, Rfl: 12  ???  HUMALOG 100 unit/mL injection vial, USE 6 UNITS SUBCUTANEOUSLY 3 TIMES DAILY BEFORE MEALS., Disp: 10 mL, Rfl: 2  ???  LANTUS 100 unit/mL injection vial, INJECT 0.2 MLS (20 UNITS TOTAL) UNDER THE SKIN AT BEDTIME.., Disp: 20 mL, Rfl: 1  ???  LANTUS 100 unit/mL injection vial, INJECT 0.2 MLS (20 UNITS TOTAL) UNDER THE SKIN AT BEDTIME.., Disp: 20 mL, Rfl: 1  ???  metFORMIN (GLUCOPHAGE XR) 500 mg PO ER 24 hr tablet, TAKE 2 TABLETS (1,000 MG TOTAL) BY MOUTH TWO (2) TIMES DAILY WITH MEALS., Disp: 360 tablet, Rfl: 0  ???  ONETOUCH ULTRA BLUE test strip, USE 1 TEST STRIP TESTING 5 TIMES A DAY., Disp: 500 strip, Rfl: 3  ???  SHINGRIX 50 MCG/0.5ML injection, , Disp: , Rfl:   ???  SILDENAFIL 100 mg tablet, TAKE 1 TABLET (100 MG TOTAL) BY MOUTH AS NEEDED FOR FOR ERECTILE DYSFUNCTION. (Patient not taking: Reported on 12/01/2017.), Disp: 10 tablet, Rfl: 4  ???  simvastatin (ZOCOR) 20 mg tablet, TOME UNA TABLETA DIARIAMENTE, Disp: 90 tablet, Rfl: 3  ???  SIMVASTATIN 20 mg tablet, TOME UNA TABLETA TODOS LOS DIAS AL ACOSTARSE, Disp: 90 tablet, Rfl: 0  ???  VIAGRA 100 MG tablet, TAKE AS DIRECTED, Disp: 10 each, Rfl: 2  ???  VIAGRA 100 MG tablet, TAKE 1 TABLET (100 MG TOTAL) BY MOUTH AS NEEDED FOR FOR ERECTILE DYSFUNCTION., Disp: 10 tablet, Rfl: 1    Health Maintenance   Topic Date Due   ??? Colon Ca Screening: COLONOSCOPY  05/20/1994   ??? Colon Ca Screening: FIT/FOBT  11/21/2015   ??? Annual Preventive Wellness Visit  05/29/2017   ??? Diabetes: HGB A1C  05/04/2018   ??? Diabetes: NEPHROPATHY MONITORING  09/04/2018   ??? Diabetes: EYE EXAM  11/03/2018   ??? Diabetes: FOOT EXAM  12/03/2018   ??? Tdap/Td Vaccine (2 - Td) 07/19/2022   ??? Influenza Vaccine  Completed   ??? Pneumococcal Vaccine  Completed   ??? Hepatitis C Screening  Completed   ??? Shingles (Shingrix) Vaccine  Completed   ??? Statin prescribed for ASCVD Prevention or Treatment  Completed       Patient Care Team:  Trena Platt., DO as PCP - General (Family Medicine)      Subjective:      Chad Watkins is a 72 y.o. male.    Review of Systems   Constitutional: Negative.  Negative for activity change, appetite change, chills, diaphoresis, fatigue, fever and unexpected weight change.   HENT: Negative.  Negative for congestion, dental problem, drooling, ear discharge, ear pain, facial swelling, hearing loss, mouth sores, nosebleeds, postnasal drip, rhinorrhea, sinus pressure, sinus pain, sneezing, sore throat, tinnitus, trouble swallowing and voice change.    Eyes: Negative.  Negative for photophobia, pain, discharge, redness, itching and visual disturbance.   Respiratory: Negative.  Negative for apnea, cough, choking, chest tightness, shortness of breath, wheezing and stridor.    Cardiovascular: Positive for leg swelling (mild). Negative for chest pain and palpitations. Gastrointestinal: Negative.  Negative for abdominal distention, abdominal pain, anal bleeding, blood in stool, constipation, diarrhea, nausea, rectal pain and vomiting.   Endocrine: Negative.  Negative for cold intolerance, heat intolerance, polydipsia, polyphagia and polyuria.   Genitourinary: Negative.  Negative for decreased urine volume, difficulty urinating, discharge, dysuria, enuresis, flank pain, frequency, genital sores, hematuria, penile pain, penile swelling, scrotal swelling, testicular pain  and urgency.   Musculoskeletal: Negative.  Negative for arthralgias, back pain, gait problem, joint swelling, myalgias, neck pain and neck stiffness.   Skin: Negative.  Negative for color change, pallor, rash and wound.   Allergic/Immunologic: Negative.  Negative for environmental allergies, food allergies and immunocompromised state.   Neurological: Negative.  Negative for dizziness, tremors, seizures, syncope, facial asymmetry, speech difficulty, weakness, light-headedness, numbness and headaches.   Hematological: Negative.  Negative for adenopathy. Does not bruise/bleed easily.   Psychiatric/Behavioral: Negative.  Negative for agitation, behavioral problems, confusion, decreased concentration, dysphoric mood, hallucinations, self-injury, sleep disturbance and suicidal ideas. The patient is not nervous/anxious and is not hyperactive.    All other systems reviewed and are negative.        Objective:      Physical Exam  Vitals signs reviewed.   Constitutional:       General: He is not in acute distress.     Appearance: Normal appearance. He is well-developed and normal weight. He is not ill-appearing, toxic-appearing or diaphoretic.      Comments: BP 138/67  ~ Pulse 76  ~ Temp 36.6 ???C (97.9 ???F) (Oral)  ~ Wt 160 lb (72.6 kg)  ~ BMI 26.63 kg/m???      HENT:      Head: Normocephalic and atraumatic.      Right Ear: Tympanic membrane, ear canal and external ear normal. There is no impacted cerumen. Left Ear: Tympanic membrane, ear canal and external ear normal. There is no impacted cerumen.      Nose: Nose normal. No congestion or rhinorrhea.      Mouth/Throat:      Mouth: Mucous membranes are moist.      Pharynx: Oropharynx is clear. No oropharyngeal exudate or posterior oropharyngeal erythema.   Eyes:      General: No scleral icterus.        Right eye: No discharge.         Left eye: No discharge.      Extraocular Movements: Extraocular movements intact.      Conjunctiva/sclera: Conjunctivae normal.      Pupils: Pupils are equal, round, and reactive to light.   Neck:      Musculoskeletal: Normal range of motion and neck supple. No neck rigidity or muscular tenderness.      Thyroid: No thyromegaly.      Vascular: No carotid bruit.   Cardiovascular:      Rate and Rhythm: Normal rate and regular rhythm.      Pulses: Normal pulses.      Heart sounds: Normal heart sounds. No murmur. No friction rub. No gallop.    Pulmonary:      Effort: Pulmonary effort is normal. No respiratory distress.      Breath sounds: Normal breath sounds. No stridor. No wheezing, rhonchi or rales.   Chest:      Chest wall: No tenderness.   Abdominal:      General: Bowel sounds are normal. There is no distension.      Palpations: Abdomen is soft. There is no mass.      Tenderness: There is no abdominal tenderness. There is no right CVA tenderness, left CVA tenderness, guarding or rebound.      Hernia: No hernia is present.   Musculoskeletal: Normal range of motion.         General: No swelling, tenderness, deformity or signs of injury.      Right lower leg: No edema.      Left lower  leg: No edema.   Lymphadenopathy:      Cervical: No cervical adenopathy.   Skin:     General: Skin is warm and dry.      Capillary Refill: Capillary refill takes less than 2 seconds.      Coloration: Skin is not jaundiced or pale.      Findings: No bruising, erythema, lesion or rash.   Neurological:      General: No focal deficit present. Mental Status: He is alert and oriented to person, place, and time.      Cranial Nerves: No cranial nerve deficit.      Sensory: No sensory deficit.      Motor: No weakness or abnormal muscle tone.      Coordination: Coordination normal.      Gait: Gait normal.      Deep Tendon Reflexes: Reflexes normal.   Psychiatric:         Mood and Affect: Mood normal.         Behavior: Behavior normal.         Thought Content: Thought content normal.         Judgment: Judgment normal.         ASSESSMENT AND PLAN     1. Preop examination    2. Hypertension associated with diabetes (HCC/RAF)      Generally doing well and no red flags (leg edema is mild acute on chronic issue due to too much salt during thanksgiving).  EKG performed, cxr and labs ordered.  Cleared for procedure.      Orders Placed This Encounter   ??? TBOC - Venipuncture w/Collection & Handling   ??? XR chest pa+lat (2 views)   ??? CBC & Auto Differential   ??? Comprehensive Metabolic Panel   ??? Hgb A1c   ??? Urinalysis w/Reflex to Culture   ??? UA,Dipstick   ??? UA,Microscopic   ??? CBC   ??? Differential, Automated   ??? ECG 12-Lead [facility] Performed       Wt Readings from Last 5 Encounters:   12/20/17 160 lb (72.6 kg)   12/01/17 154 lb (69.9 kg)   11/11/17 154 lb (69.9 kg)   11/04/17 156 lb (70.8 kg)   11/02/17 152 lb 12.8 oz (69.3 kg)     BP Readings from Last 5 Encounters:   12/20/17 138/67   12/01/17 122/63   11/11/17 135/74   11/04/17 143/66   11/02/17 171/78       Hgb A1c - HPLC   Date Value Ref Range Status   09/03/2017 8.7 (H) <5.7 % Final     Comment:     For patients with diabetes, an A1c less than (<) or equal (=) to 7.0% is recommended for most patients, however the goal may be higher or lower depending on age and/or other medical problems.   For a diagnosis of diabetes, A1c greater than (>) or equal(=) to 6.5% indicates diabetes; values between 5.7% and 6.4% may indicate an increased risk of developing diabetes.   12/24/2016 9.1 (H) <5.7 % Final     Comment: For patients with diabetes, an A1c less than (<) or equal (=) to 7.0% is recommended for most patients, however the goal may be higher or lower depending on age and/or other medical problems.   For a diagnosis of diabetes, A1c greater than (>) or equal(=) to 6.5% indicates diabetes; values between 5.7% and 6.4% may indicate an increased risk of developing diabetes.   05/29/2016 7.9 (H) <5.7 %  Final     Comment:     For patients with diabetes, an A1c less than (<) or equal (=) to 7.0% is recommended for most patients, however the goal may be higher or lower depending on age and/or other medical problems.   For a diagnosis of diabetes, A1c greater than (>) or equal(=) to 6.5% indicates diabetes; values between 5.7% and 6.4% may indicate an increased risk of developing diabetes.     Hemoglobin A1C, Manual   Date Value Ref Range Status   11/02/2017 8.1 (A) 4 - 6.5 % Final     Cholesterol   Date Value Ref Range Status   09/03/2017 182 See Comment mg/dL Final     Comment:       The significance of total cholesterol depends on the values of LDL, HDL, triglycerides and the clinical context. A patient-provider discussion may be considered.       05/29/2016 205 See Comment mg/dL Final     Comment:       The significance of total cholesterol depends on the values of LDL, HDL, triglycerides and the clinical context. A patient-provider discussion may be considered.       10/23/2015 187 See Comment mg/dL Final     Comment:       The significance of total cholesterol depends on the values of LDL, HDL, triglycerides and the clinical context. A patient-provider discussion may be considered.         Cholesterol,LDL,Calc   Date Value Ref Range Status   05/29/2016 74 <100 mg/dL Final     Comment:     If LDL value falls outside of the designated range AND if  included in any of the following categories, a  patient-provider discussion is recommended.     Statin therapy is recommended for individuals: 1. with clinical atherosclerotic cardiovascular disease     irrespective of LDL levels;  2. with LDL > or = 190 mg/dL;  3. with diabetes, aged 40-75 years, with LDL between 70 and     189 mg/dL;  4. without any of the above but who have LDL between 70 and     189 mg/dL and an estimated 56-OZHY risk of     atherosclerotic cardiovascular disease > or = 7.5%     (consider statin therapy if estimated 10-year risk > or =     5.0%) (ACC/AHA 2013 Guidelines).   10/23/2015 61 <100 mg/dL Final     Comment:     If LDL value falls outside of the designated range AND if  included in any of the following categories, a  patient-provider discussion is recommended.     Statin therapy is recommended for individuals:  1. with clinical atherosclerotic cardiovascular disease     irrespective of LDL levels;  2. with LDL > or = 190 mg/dL;  3. with diabetes, aged 40-75 years, with LDL between 70 and     189 mg/dL;  4. without any of the above but who have LDL between 70 and     189 mg/dL and an estimated 86-VHQI risk of     atherosclerotic cardiovascular disease > or = 7.5%     (consider statin therapy if estimated 10-year risk > or =     5.0%) (ACC/AHA 2013 Guidelines).   10/19/2014 55 <100 mg/dL Final     Comment:     If LDL value falls outside of the designated range or if  included in any of the following categories, a  patient-provider discussion is recommended.     Statin therapy is recommended for individuals:  1. with clinical atherosclerotic cardiovascular disease     irrespective of LDL levels;  2. with LDL > or = 190 mg/dL;  3. with diabetes, aged 40-75 years, with LDL between 70 and     189 mg/dL;  4. without any of the above but who have LDL between 70 and     189 mg/dL and an estimated 16-XWRU risk of     atherosclerotic cardiovascular disease > or = 7.5%     (consider statin therapy if estimated 10-year risk > or =     5.0%) (ACC/AHA 2013 Guidelines).     Cholesterol, HDL   Date Value Ref Range Status 09/03/2017 94 >40 mg/dL Final     Comment:     If HDL cholesterol level falls outside of the designated  range, a patient-provider discussion is recommended   05/29/2016 120 >40 mg/dL Final     Comment:     If HDL cholesterol level falls outside of the designated  range, a patient-provider discussion is recommended   10/23/2015 115 >40 mg/dL Final     Comment:     If HDL cholesterol level falls outside of the designated  range, a patient-provider discussion is recommended     Triglycerides   Date Value Ref Range Status   09/03/2017 81 <150 mg/dL Final     Comment:     Patient is non-fasting, interpret with caution.    If Triglyceride level falls outside of the designated range,  a patient-provider discussion is recommended.     05/29/2016 54 <150 mg/dL Final     Comment:       If Triglyceride level falls outside of the designated range,  a patient-provider discussion is recommended.     10/23/2015 56 <150 mg/dL Final     Comment:       If Triglyceride level falls outside of the designated range,  a patient-provider discussion is recommended.        Creatinine   Date Value Ref Range Status   09/03/2017 0.86 0.60 - 1.30 mg/dL Final   04/54/0981 1.91 0.60 - 1.30 mg/dL Final   47/82/9562 1.30 0.60 - 1.30 mg/dL Final        PHQ-9 Results  Depression Screening (Patient Health Questionnaire PHQ) 08/18/2012 10/19/2014 10/19/2014 09/11/2015 12/24/2016 01/22/2017   PHQ-2: Feeling down, depressed, or hopeless - No No No No No   PHQ-2: Little interest or pleassure in doing things - No No No No No   Little interest or pleasure in doing things Not at all - - - - -   Feeling down, depressed, or hopeless Not at all - - - - -   Total Score 0 - - - - -   Some recent data might be hidden       GAD-7 Results  No flowsheet data found.    DAST Results  No flowsheet data found.    Audit-C results  No flowsheet data found.      No follow-ups on file.  The above plan of care, diagnosis, orders, and follow-up were discussed with the patient.  Questions related to this recommended plan of care were answered.  Lonzo Cloud. Karolee Ohs, DO  12/20/2017    Author:  Lonzo Cloud. Ramond Darnell 12/20/2017 3:15 PM

## 2017-12-21 ENCOUNTER — Telehealth: Payer: BLUE CROSS/BLUE SHIELD

## 2017-12-21 LAB — Comprehensive Metabolic Panel
ALKALINE PHOSPHATASE: 91 U/L (ref 37–113)
CALCIUM: 9.4 mg/dL (ref 8.6–10.4)

## 2017-12-21 LAB — Hgb A1c: HGB A1C - HPLC: 9.6 — ABNORMAL HIGH (ref <5.7)

## 2017-12-21 LAB — UA,Microscopic: RBCS: 6 {cells}/uL (ref 0–11)

## 2017-12-21 LAB — UA,Dipstick: KETONES: NEGATIVE (ref 1.005–1.030)

## 2017-12-21 LAB — CBC: RED BLOOD CELL COUNT: 3.78 10*6/uL — ABNORMAL LOW (ref 4.41–5.95)

## 2017-12-21 LAB — Differential Automated: ABSOLUTE EOS COUNT: 0.08 10*3/uL (ref 0.00–0.50)

## 2017-12-21 NOTE — Telephone Encounter
-----   Message from [place]. Gregor, DO sent at 12/21/2017 10:54 AM PST -----  1) sugar levels are worse - need to focus on reducing starch/carbs/sweets in diet, hydrate well, and exercise.  This will need to be monitored perioperatively and then follow up with endocrinology after the procedure  2) your blood counts dropped 1.5 points in 3 months causing anemia, this will need to be rechecked after your procedure  3) at this point still cleared for cataract procedure but these need to be addressed soon after the procedure.

## 2017-12-21 NOTE — Telephone Encounter
Marianna Fuss from [facility] [phone] would like a call once pts preop is complete and would like pre op faxed to     Fax # [phone]  [phone]

## 2017-12-21 NOTE — Telephone Encounter
Pt called back and request that you call him TSR

## 2017-12-21 NOTE — Telephone Encounter
Left vmail to r/c

## 2017-12-22 NOTE — Telephone Encounter
S/w pt and relayed msg in [place]. Pt verbalized understanding.

## 2017-12-23 MED ORDER — ONETOUCH ULTRA BLUE VI STRP
ORAL_STRIP | 3 refills | Status: AC
Start: 2017-12-23 — End: ?

## 2017-12-24 NOTE — Telephone Encounter
S/w Karina at [facility] and faxed over Pre- op to numbers provided below TSR

## 2017-12-27 MED ORDER — SIMVASTATIN 20 MG PO TABS
ORAL_TABLET | 3 refills | Status: AC
Start: 2017-12-27 — End: ?

## 2017-12-30 MED ORDER — LISINOPRIL 20 MG PO TABS
ORAL_TABLET | 3 refills
Start: 2017-12-30 — End: ?

## 2018-01-10 MED ORDER — HUMALOG 100 UNIT/ML SC SOLN
2 refills | Status: AC
Start: 2018-01-10 — End: 2018-04-12

## 2018-01-27 ENCOUNTER — Ambulatory Visit: Payer: BLUE CROSS/BLUE SHIELD

## 2018-01-27 DIAGNOSIS — E1042 Type 1 diabetes mellitus with diabetic polyneuropathy: Secondary | ICD-10-CM

## 2018-01-27 DIAGNOSIS — M65312 Trigger thumb, left thumb: Secondary | ICD-10-CM

## 2018-01-27 LAB — Vitamin B12: VITAMIN B12: 498 pg/mL (ref 254–1060)

## 2018-01-27 LAB — Uric Acid: URIC ACID: 4.9 mg/dL (ref 3.4–8.8)

## 2018-01-27 LAB — TSH with reflex FT4, FT3: TSH: 0.96 u[IU]/mL (ref 0.3–4.7)

## 2018-01-27 LAB — Differential Automated: ABSOLUTE MONO COUNT: 0.57 10*3/uL (ref 0.20–0.80)

## 2018-01-27 LAB — Ferritin: FERRITIN: 127 ng/mL (ref 8–350)

## 2018-01-27 LAB — Magnesium: MAGNESIUM: 1.4 meq/L (ref 1.4–1.9)

## 2018-01-27 LAB — Comprehensive Metabolic Panel
ANION GAP: 14 mmol/L (ref 8–19)
GLUCOSE: 175 mg/dL — ABNORMAL HIGH (ref 65–99)

## 2018-01-27 LAB — PSA,Free & Total Profile: PSA,FREE: 0.2 ng/mL (ref 0–6.5)

## 2018-01-27 LAB — Iron & Iron Binding Capacity: IRON: 131 ug/dL (ref 41–179)

## 2018-01-27 LAB — Lipid Panel: CHOLESTEROL, HDL: 99 mg/dL (ref >40)

## 2018-01-27 LAB — CBC: MEAN PLATELET VOLUME: 10.8 fL (ref 9.3–13.0)

## 2018-01-27 LAB — Cholesterol, LDL, Quantitated: CHOLESTEROL,LDL,QUANTITATED: 80 mg/dL (ref <100)

## 2018-01-28 LAB — UA,Microscopic: WBCS: 1 {cells}/uL (ref 0–22)

## 2018-01-28 LAB — Folate,Serum: FOLATE,SERUM: 24.2 ng/mL (ref 8.1–30.4)

## 2018-01-28 LAB — Vitamin D,25-Hydroxy: VITAMIN D,25-HYDROXY: 55 ng/mL — ABNORMAL HIGH (ref 20–50)

## 2018-01-28 LAB — Hgb A1c: HGB A1C - HPLC: 9.4 — ABNORMAL HIGH (ref <5.7)

## 2018-01-28 LAB — UA,Dipstick: GLUCOSE: NEGATIVE (ref 1.005–1.030)

## 2018-01-28 LAB — Albumin/Creatinine Ratio,Urine: ALBUMIN/CREATININE RATIO: <12 mg/L

## 2018-01-30 LAB — Testosterone, Free, Total and  SHBG, Men: TESTOSTERONE, PERCENTAGE FREE: 0.9 — ABNORMAL LOW (ref 1.6–2.9)

## 2018-01-31 ENCOUNTER — Ambulatory Visit: Payer: BLUE CROSS/BLUE SHIELD | Attending: Student in an Organized Health Care Education/Training Program

## 2018-01-31 ENCOUNTER — Telehealth: Payer: BLUE CROSS/BLUE SHIELD

## 2018-01-31 ENCOUNTER — Inpatient Hospital Stay: Payer: BLUE CROSS/BLUE SHIELD | Attending: Student in an Organized Health Care Education/Training Program

## 2018-01-31 DIAGNOSIS — Z1211 Encounter for screening for malignant neoplasm of colon: Secondary | ICD-10-CM

## 2018-01-31 NOTE — Telephone Encounter
Reply by: Adela Ports  Scheduled as requested. THanks

## 2018-01-31 NOTE — Telephone Encounter
Prep instructions have been delivered to patient via:      [] Email to __________  [] Fax to ____________  [] Mail to Address on file  [] Sent via letter on Mychart  [x] Preps provided by the office

## 2018-01-31 NOTE — Consults
PATIENT: Chad Watkins  MRN: [id number]  DOB: December 06, 1945  DATE OF SERVICE: 01/31/2018    REFERRING PRACTITIONER: Trena Platt., DO  PRIMARY CARE PROVIDER: Trena Platt., DO    REASON FOR REFERRAL:   Screening colonoscopy     Subjective:     Chief Complaint:  Chad Watkins is a 73 y.o. male who presents for colon cancer screening. Last colonoscopy about 10 years ago, normal per patient.  No family history of colon cancer. Denies abdominal pain, NV, weight loss, blood in stool, changes in BM.     Past Medical History:    Past Medical History:   Diagnosis Date   ??? Diabetic neuropathy (HCC/RAF) 01/27/2018   ??? Diabetic peripheral vascular disease (HCC/RAF) 09/03/2017   ??? DM type 1 with diabetic mixed hyperlipidemia (HCC/RAF) 09/03/2017   ??? History of fracture of left ankle 12/01/2017   ??? Hypertension associated with diabetes (HCC/RAF) 09/03/2017   ??? Peripheral vascular disease (HCC/RAF) 09/03/2017   ??? Trigger thumb 01/27/2018   ??? Type 1 diabetes mellitus with diabetic neuropathy, unspecified (HCC/RAF) 09/03/2017       Patient Active Problem List   Diagnosis   ??? Bursitis   ??? Skin cancer   ??? Hyperlipidemia   ??? Palpitation   ??? Need for vaccination   ??? Healthcare maintenance   ??? Hypertension, well controlled   ??? Abnormal EKG   ??? Left ankle pain   ??? Hypertension associated with diabetes (HCC/RAF)   ??? DM type 1 with diabetic mixed hyperlipidemia (HCC/RAF)   ??? DM (diabetes mellitus), type 1, uncontrolled, periph vascular complic (HCC/RAF)   ??? Peripheral vascular disease (HCC/RAF)   ??? DM (diabetes mellitus), type 1, uncontrolled w/neurologic complication (HCC/RAF)   ??? History of fracture of left ankle   ??? Diabetic neuropathy (HCC/RAF)   ??? Trigger thumb       Past Surgical History:    No past surgical history on file.    Family History: family history is not on file.    Social History:  reports that he has never smoked. He has never used smokeless tobacco. He reports current alcohol use of about 4.2 oz of alcohol per week. Allergies:   has No Known Allergies.    Medications:  Medications that the patient states to be currently taking   Medication Sig   ??? acetaminophen-codeine (TYLENOL #3) 300-30 mg tablet TAKE 1 TABLET EVERY 4 HOURS AS NEEDED FOR PAIN   ??? amLODIPine-benazepril 5-20 mg capsule Take 1 capsule by mouth two (2) times daily.   ??? BD INSULIN SYRINGE U/F 31G X 5/16'' 0.3 ML syringe INJECT 1 EACH UNDER THE SKIN FIVE (5) TIMES DAILY.   ??? FLUZONE HIGH-DOSE 0.5 ML syringe    ??? gatifloxacin 0.5% ophthalmic solution    ??? glucose blood test strip Use as instructed   ??? HUMALOG 100 unit/mL injection vial USE 6 UNITS SUBCUTANEOUSLY 3 TIMES DAILY BEFORE MEALS.   ??? LANTUS 100 unit/mL injection vial INJECT 0.2 MLS (20 UNITS TOTAL) UNDER THE SKIN AT BEDTIME.Marland Kitchen   ??? LANTUS 100 unit/mL injection vial INJECT 0.2 MLS (20 UNITS TOTAL) UNDER THE SKIN AT BEDTIME.Marland Kitchen   ??? LOTEMAX SM 0.38 % GEL    ??? metFORMIN (GLUCOPHAGE XR) 500 mg PO ER 24 hr tablet TAKE 2 TABLETS (1,000 MG TOTAL) BY MOUTH TWO (2) TIMES DAILY WITH MEALS.   ??? ONETOUCH ULTRA BLUE test strip USE 1 TEST STRIP TESTING 5 TIMES A DAY.   ??? PROLENSA 0.07 %  SOLN    ??? SHINGRIX 50 MCG/0.5ML injection    ??? simvastatin (ZOCOR) 20 mg tablet TOME UNA TABLETA DIARIAMENTE   ??? SIMVASTATIN 20 mg tablet TOME UNA TABLETA TODOS LOS DIAS AL ACOSTARSE       Review of Systems:  A complete ROS was performed. Pertinent positives and negatives are in the HPI.  Remainder of 14 systems is negative.      Objective:     Physical Exam:    Vitals: BP 107/63  ~ Pulse (!) 103  ~ Temp 36 ???C (96.8 ???F) (Tympanic)  ~ Ht 5' 5'' (1.651 m)  ~ Wt 156 lb (70.8 kg)  ~ BMI 25.96 kg/m???  Body mass index is 25.96 kg/m???.       Wt Readings from Last 12 Encounters:   01/31/18 156 lb (70.8 kg)   01/27/18 158 lb (71.7 kg)   12/20/17 160 lb (72.6 kg)   12/01/17 154 lb (69.9 kg)   11/11/17 154 lb (69.9 kg)   11/04/17 156 lb (70.8 kg)   11/02/17 152 lb 12.8 oz (69.3 kg)   09/03/17 158 lb (71.7 kg)   08/05/17 159 lb (72.1 kg) 05/11/17 158 lb (71.7 kg)   04/01/17 158 lb (71.7 kg)   01/04/17 157 lb (71.2 kg)        Constitutional:WD/WN alert, appears stated age and cooperative   Skin: Skin color, texture, turgor normal. No rashes or lesions   Head: Normocephalic, without obvious abnormality, atraumatic   Eyes: Anicteric sclera. PERRL, EOM's intact.    Mouth: Moist mucus membranes, clear oropharynx   Neck: no adenopathy, supple, symmetrical, trachea midline and thyroid not enlarged, symmetric, no tenderness/mass/nodules   Lungs: clear to auscultation bilaterally   Heart: regular rate and rhythm, S1, S2 normal, no murmur, click, rub or gallop   Abdomen: Abdomen is soft, nontender, non-distended with normo-active bowel sounds. No rebound tenderness, guarding, or palpable hepatosplenomegaly.    Extremities: extremities normal, atraumatic, no cyanosis or edema   Psychiatric: oriented to time, place and person      Lab Review:  Lab Results   Component Value Date    WBC 6.24 01/27/2018    HGB 12.4 (L) 01/27/2018    HCT 37.1 (L) 01/27/2018    MCV 91.6 01/27/2018    PLT 262 01/27/2018     Lab Results   Component Value Date    CREAT 0.89 01/27/2018    BUN 15 01/27/2018    NA 137 01/27/2018    K 4.8 01/27/2018    CL 97 01/27/2018    CO2 26 01/27/2018    ALT 15 01/27/2018    AST 25 01/27/2018    ALKPHOS 69 01/27/2018    BILITOT 0.4 01/27/2018    ALBUMIN 4.4 01/27/2018    AMYLASE 92 12/24/2016    LIPASE 16 12/24/2016     Lab Results   Component Value Date    SRWEST 26 (H) 09/03/2017    CRP <0.3 09/03/2017     Lab Results   Component Value Date    FE 131 01/27/2018    FEBINDSAT 41 01/27/2018    TIBC 320 01/27/2018    FERRITIN 127 01/27/2018    VITAMINB12 498 01/27/2018    FOLATE 24.2 01/27/2018     Assessment / Plan :     # Average Risk for CRC: due for screening at this time.     The patient has been made aware of the risks, benefits, alternatives, and the risks of the alternatives  for colonoscopy with the possibility of biopsies, polypectomies, or treatment of bleeding lesions under moderate sedation/MAC anesthesia.  The patient has indicated to me that he/she understands these risks and is willing to proceed.    Plan:  Colonoscopy w/ MAC 2/2 age  RTC as needed    Orders Placed This Encounter   ??? GI Endoscopy Procedures       Greater than 50% of the encounter time was spent on counseling and coordination of care for the contents and topics discussed above.      Author: Ludwig Lean, MD 01/31/2018 9:26 AM

## 2018-01-31 NOTE — Telephone Encounter
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     [x]  Patient has been provided check in instructions, to bring responsible adult to procedure.    [x]  Provided Prep Instructions, based on orders.     [x]  Reviewed if RX prep was prescribed if applicable to [facility].      [x]  Ensured insurance turnaround STAT, or Routine were flagged accordingly.     []  If [facility] patient, offered Monday Conscious Sedation, if not willing to pay 200.00 for MAC.     [x]   Any other type of insurance (Except Medical), please inform patient, MAC is usually a non-covered service. $200.00 is the fee for the professional Anesthesia services.  Conscious Sedation is included in their procedure, but not offered at [facility], or [facility] MPU.   Only at [employer] locations, such as [address], [place], [place].         HIT F2  ??? Patient has been informed about the $200.00 OUT OF POCKET  expense for MAC and patient has agreed to : PAY PRIOR TO CHECK IN WITH HOSPITAL FINANCIAL TEAM    [facility] if the slot is booked prior to scheduling request being fulfilled, please reach out to the patient to coordinate new procedure date and time.       Reminder to all Clinic Staff???If you are coordinating specific procedure time and date with patient and MD, it is your responsibility to confirm these details with the patient. If MPU schedulers are communicating directly with the patient it is their responsibility to confirm the appointment.              MAC SCRIPT  As you may be aware, there are two sedation options that are used for colonoscopy and upper gastrointestinal endoscopy. One form of sedation consists of a combination of medicines (Versed and Fentanyl) administered by a registered nurse (RN) under the supervision of your gastroenterologist.  This form of sedation is included in your gastroenterologist???s fee.   The other form of sedation is ???intravenous sedation??? with the use of Propofol (sedative) administered by a specially trained physician called an anesthesiologist, also known as MAC (Monitored Anesthesia Care). Propofol has a rapid onset and short duration, compared to Versed and Fentanyl, and provides complete sedation, whereas with conscious sedation this is not guaranteed. Propofol also has potent anti-nausea properties, so one tends to awake quickly and without any ill effects.               Effective July 1st, 2017 the [facility] Medical Procedure unit in [facility] and [place] will only perform cases with Monitored Anesthesia Care (Propofol) for endoscopy procedures.   This type of sedation is not always covered by health plans. We will submit an authorization request if necessary for this type of sedation, but approval will be based on the medical guideline for that insurance carrier, and  there are limited medical conditions that would be considered appropriate to cover an anesthesiologist which include ???severe systematic diseases??? such as significant heart and/or lung diseases, sever insulin dependent diabetes, sleep apnea, and morbid obesity. Other conditions that may require  an anesthesiologist include a history of drug or alcohol abuse, chronic use of Valium type medications such as Ativan, Xanax, or Clonopin, or a prior documented history of previous problems with conscious sedations (Versed and Fentanyl) or anesthesia.  Please refer to your health plan medical guideline for specifics on their anesthesia coverage determination.

## 2018-01-31 NOTE — Patient Instructions
Schedule colonoscopy  RTC as needed

## 2018-02-04 ENCOUNTER — Telehealth: Payer: BLUE CROSS/BLUE SHIELD

## 2018-02-04 NOTE — Telephone Encounter
-----   Message from [place]. Roland Earl, DO sent at 02/04/2018  9:51 AM PST -----  1) sugar still very much not controlled - you need to follow up with endocrinology to try to gain better control of sugar levels  2) magnesium levels little low, recommend foods high in magnesium  3) iron/anemia levels look good/improved  4) rest of labs look good

## 2018-02-04 NOTE — Telephone Encounter
Called patient to both phone numbers provided in system and left message stating call was regarding lab resuts, to please call us back at his earliest convenience. Also mentioned I was unable to leave a detailed message due to HIPAA privacy. [facility].    Chad Watkins

## 2018-02-05 MED ORDER — LANTUS 100 UNIT/ML SC SOLN
1 refills | Status: AC
Start: 2018-02-05 — End: 2018-09-20

## 2018-02-10 ENCOUNTER — Telehealth: Payer: BLUE CROSS/BLUE SHIELD

## 2018-02-10 NOTE — Telephone Encounter
Called patient twice, left message for patient to call back regarding lab results. Unable to leave detailed message due to HIPAA privacy. Called patient on 02/04/2018 and 02/10/2018.    Chad Watkins

## 2018-02-10 NOTE — Telephone Encounter
-----   Message from [place]. Roland Earl, DO sent at 02/04/2018  9:51 AM PST -----  1) sugar still very much not controlled - you need to follow up with endocrinology to try to gain better control of sugar levels  2) magnesium levels little low, recommend foods high in magnesium  3) iron/anemia levels look good/improved  4) rest of labs look good

## 2018-02-21 MED ORDER — AMLODIPINE BESY-BENAZEPRIL HCL 5-20 MG PO CAPS
1 | ORAL_CAPSULE | Freq: Two times a day (BID) | ORAL | 1 refills | Status: AC
Start: 2018-02-21 — End: 2018-04-09

## 2018-02-22 ENCOUNTER — Ambulatory Visit: Payer: BLUE CROSS/BLUE SHIELD

## 2018-02-22 ENCOUNTER — Telehealth: Payer: BLUE CROSS/BLUE SHIELD

## 2018-02-22 DIAGNOSIS — E1042 Type 1 diabetes mellitus with diabetic polyneuropathy: Secondary | ICD-10-CM

## 2018-02-22 NOTE — Progress Notes
PATIENT: Chad Watkins  MRN: [id number]  DOB: 1945-12-11  DATE OF SERVICE: 02/22/2018    CHIEF COMPLAINT:   Chief Complaint   Patient presents with   ??? discuss result     Past Medical History:   Diagnosis Date   ??? Diabetic neuropathy (HCC/RAF) 01/27/2018   ??? Diabetic peripheral vascular disease (HCC/RAF) 09/03/2017   ??? DM type 1 with diabetic mixed hyperlipidemia (HCC/RAF) 09/03/2017   ??? History of fracture of left ankle 12/01/2017   ??? Hypertension associated with diabetes (HCC/RAF) 09/03/2017   ??? Peripheral vascular disease (HCC/RAF) 09/03/2017   ??? Trigger thumb 01/27/2018   ??? Type 1 diabetes mellitus with diabetic neuropathy, unspecified (HCC/RAF) 09/03/2017       No past surgical history on file.    Social History     Tobacco Use   Smoking Status Never Smoker   Smokeless Tobacco Never Used       No family history on file.    Social History     Substance and Sexual Activity   Alcohol Use Yes   ??? Alcohol/week: 4.2 oz   ??? Types: 7 Standard drinks or equivalent per week       Social History     Substance and Sexual Activity   Drug Use Not on file       No Known Allergies    Patient Active Problem List   Diagnosis   ??? Bursitis   ??? Skin cancer   ??? Hyperlipidemia   ??? Palpitation   ??? Need for vaccination   ??? Healthcare maintenance   ??? Hypertension, well controlled   ??? Abnormal EKG   ??? Left ankle pain   ??? Hypertension associated with diabetes (HCC/RAF)   ??? DM type 1 with diabetic mixed hyperlipidemia (HCC/RAF)   ??? DM (diabetes mellitus), type 1, uncontrolled, periph vascular complic (HCC/RAF)   ??? Peripheral vascular disease (HCC/RAF)   ??? DM (diabetes mellitus), type 1, uncontrolled w/neurologic complication (HCC/RAF)   ??? History of fracture of left ankle   ??? Diabetic neuropathy (HCC/RAF)   ??? Trigger thumb         Current Outpatient Medications:   ???  acetaminophen-codeine (TYLENOL #3) 300-30 mg tablet, TAKE 1 TABLET EVERY 4 HOURS AS NEEDED FOR PAIN, Disp: , Rfl: 0 ???  AMLODIPINE-BENAZEPRIL 5-20 mg capsule, TAKE 1 CAPSULE BY MOUTH TWO (2) TIMES DAILY., Disp: 180 capsule, Rfl: 1  ???  BD INSULIN SYRINGE U/F 31G X 5/16'' 0.3 ML syringe, INJECT 1 EACH UNDER THE SKIN FIVE (5) TIMES DAILY., Disp: 100 each, Rfl: 3  ???  FLUZONE HIGH-DOSE 0.5 ML syringe, , Disp: , Rfl:   ???  gatifloxacin 0.5% ophthalmic solution, , Disp: , Rfl:   ???  glucose blood test strip, Use as instructed, Disp: 100 each, Rfl: 12  ???  HUMALOG 100 unit/mL injection vial, USE 6 UNITS SUBCUTANEOUSLY 3 TIMES DAILY BEFORE MEALS., Disp: 10 mL, Rfl: 2  ???  LANTUS 100 unit/mL injection vial, INJECT 0.2 MLS (20 UNITS TOTAL) UNDER THE SKIN AT BEDTIME.., Disp: 20 mL, Rfl: 1  ???  LANTUS 100 unit/mL injection vial, INJECT 20 UNITS TOTAL UNDER THE SKIN AT BEDTIME.., Disp: 20 mL, Rfl: 1  ???  LOTEMAX SM 0.38 % GEL, , Disp: , Rfl:   ???  metFORMIN (GLUCOPHAGE XR) 500 mg PO ER 24 hr tablet, TAKE 2 TABLETS (1,000 MG TOTAL) BY MOUTH TWO (2) TIMES DAILY WITH MEALS., Disp: 360 tablet, Rfl: 0  ???  ONETOUCH ULTRA BLUE  test strip, USE 1 TEST STRIP TESTING 5 TIMES A DAY., Disp: 500 strip, Rfl: 3  ???  PROLENSA 0.07 % SOLN, , Disp: , Rfl:   ???  SHINGRIX 50 MCG/0.5ML injection, , Disp: , Rfl:   ???  simvastatin (ZOCOR) 20 mg tablet, TOME UNA TABLETA DIARIAMENTE, Disp: 90 tablet, Rfl: 3  ???  SIMVASTATIN 20 mg tablet, TOME UNA TABLETA TODOS LOS DIAS AL ACOSTARSE, Disp: 90 tablet, Rfl: 3    Health Maintenance   Topic Date Due   ??? Colon Ca Screening: COLONOSCOPY  05/20/1994   ??? Colon Ca Screening: FIT/FOBT  11/21/2015   ??? Annual Preventive Wellness Visit  05/29/2017   ??? Diabetes: HGB A1C  07/28/2018   ??? Diabetes: EYE EXAM  11/03/2018   ??? Diabetes: FOOT EXAM  12/03/2018   ??? Diabetes: NEPHROPATHY MONITORING  01/28/2019   ??? Tdap/Td Vaccine (2 - Td) 07/19/2022   ??? Influenza Vaccine  Completed   ??? Pneumococcal Vaccine  Completed   ??? Hepatitis C Screening  Completed   ??? Shingles (Shingrix) Vaccine  Completed ??? Statin prescribed for ASCVD Prevention or Treatment  Completed       Patient Care Team:  Trena Platt., DO as PCP - General (Family Medicine)      Subjective:      Chad Watkins is a 73 y.o. male.    Review of Systems   Constitutional: Negative.  Negative for activity change, appetite change, chills, diaphoresis, fatigue, fever and unexpected weight change.   HENT: Negative.  Negative for congestion, dental problem, drooling, ear discharge, ear pain, facial swelling, hearing loss, mouth sores, nosebleeds, postnasal drip, rhinorrhea, sinus pressure, sinus pain, sneezing, sore throat, tinnitus, trouble swallowing and voice change.    Eyes: Negative.  Negative for photophobia, pain, discharge, redness, itching and visual disturbance.   Respiratory: Negative.  Negative for apnea, cough, choking, chest tightness, shortness of breath, wheezing and stridor.    Cardiovascular: Negative.  Negative for chest pain, palpitations and leg swelling.   Gastrointestinal: Negative.  Negative for abdominal distention, abdominal pain, anal bleeding, blood in stool, constipation, diarrhea, nausea, rectal pain and vomiting.   Endocrine: Negative.  Negative for cold intolerance, heat intolerance, polydipsia, polyphagia and polyuria.   Genitourinary: Negative.  Negative for decreased urine volume, difficulty urinating, discharge, dysuria, enuresis, flank pain, frequency, genital sores, hematuria, penile pain, penile swelling, scrotal swelling, testicular pain and urgency.   Musculoskeletal: Negative.  Negative for arthralgias, back pain, gait problem, joint swelling, myalgias, neck pain and neck stiffness.   Skin: Negative.  Negative for color change, pallor, rash and wound.   Allergic/Immunologic: Negative.  Negative for environmental allergies, food allergies and immunocompromised state.   Neurological: Negative.  Negative for dizziness, tremors, seizures, syncope, facial asymmetry, speech difficulty, weakness, light-headedness, numbness and headaches.   Hematological: Negative.  Negative for adenopathy. Does not bruise/bleed easily.   Psychiatric/Behavioral: Negative.  Negative for agitation, behavioral problems, confusion, decreased concentration, dysphoric mood, hallucinations, self-injury, sleep disturbance and suicidal ideas. The patient is not nervous/anxious and is not hyperactive.    All other systems reviewed and are negative.        Objective:      Physical Exam  Vitals signs reviewed.   Constitutional:       General: He is not in acute distress.     Appearance: Normal appearance. He is well-developed and normal weight. He is not ill-appearing, toxic-appearing or diaphoretic.      Comments: BP 116/56  ~ Pulse Marland Kitchen)  101  ~ Temp 36.7 ???C (98 ???F) (Tympanic)  ~ Wt 161 lb (73 kg)  ~ BMI 26.79 kg/m???      HENT:      Head: Normocephalic and atraumatic.      Right Ear: External ear normal.      Left Ear: External ear normal.      Nose: Nose normal.   Eyes:      Conjunctiva/sclera: Conjunctivae normal.   Neck:      Musculoskeletal: Normal range of motion and neck supple. No neck rigidity or muscular tenderness.   Pulmonary:      Effort: Pulmonary effort is normal.   Lymphadenopathy:      Cervical: No cervical adenopathy.   Skin:     General: Skin is warm and dry.   Neurological:      General: No focal deficit present.      Mental Status: He is alert and oriented to person, place, and time.   Psychiatric:         Mood and Affect: Mood normal.         Behavior: Behavior normal.         Thought Content: Thought content normal.         Judgment: Judgment normal.         ASSESSMENT AND PLAN     1. Hypertension associated with diabetes (HCC/RAF)    2. DM type 1 with diabetic mixed hyperlipidemia (HCC/RAF)    3. DM (diabetes mellitus), type 1, uncontrolled w/neurologic complication (HCC/RAF)    4. Diabetic polyneuropathy associated with type 1 diabetes mellitus (HCC/RAF)    5. DM (diabetes mellitus), type 1, uncontrolled, periph vascular complic (HCC/RAF)    6. Healthcare maintenance      Worse control of diabetes.  Does report some hx of hypoglycemic episodes if not consistent eating.  Knows that certain foods makes worse.  Has not been back to endo nor nutrition in many months - needs to make follow up appt's with them for further education and adjustment of meds.  May need to consider metformin so more responsive when does eat.  But also need consistent small meals to help.      Complications relatively stable right now.  2 months for diabetic follow up.  May also need to consider Dexcom CGM?      No orders of the defined types were placed in this encounter.      Wt Readings from Last 5 Encounters:   02/22/18 161 lb (73 kg)   01/31/18 156 lb (70.8 kg)   01/27/18 158 lb (71.7 kg)   12/20/17 160 lb (72.6 kg)   12/01/17 154 lb (69.9 kg)     BP Readings from Last 5 Encounters:   02/22/18 116/56   01/31/18 107/63   01/27/18 130/74   12/20/17 138/67   12/01/17 122/63       Hgb A1c - HPLC   Date Value Ref Range Status   01/27/2018 9.4 (H) <5.7 % Final     Comment:     For patients with diabetes, an A1c less than (<) or equal (=) to 7.0% is recommended for most patients, however the goal may be higher or lower depending on age and/or other medical problems.   For a diagnosis of diabetes, A1c greater than (>) or equal(=) to 6.5% indicates diabetes; values between 5.7% and 6.4% may indicate an increased risk of developing diabetes.   12/20/2017 9.6 (H) <5.7 % Final     Comment:  For patients with diabetes, an A1c less than (<) or equal (=) to 7.0% is recommended for most patients, however the goal may be higher or lower depending on age and/or other medical problems.   For a diagnosis of diabetes, A1c greater than (>) or equal(=) to 6.5% indicates diabetes; values between 5.7% and 6.4% may indicate an increased risk of developing diabetes.   09/03/2017 8.7 (H) <5.7 % Final     Comment:     For patients with diabetes, an A1c less than (<) or equal (=) to 7.0% is recommended for most patients, however the goal may be higher or lower depending on age and/or other medical problems.   For a diagnosis of diabetes, A1c greater than (>) or equal(=) to 6.5% indicates diabetes; values between 5.7% and 6.4% may indicate an increased risk of developing diabetes.     Hemoglobin A1C, Manual   Date Value Ref Range Status   11/02/2017 8.1 (A) 4 - 6.5 % Final     Cholesterol   Date Value Ref Range Status   01/27/2018 185 See Comment mg/dL Final     Comment:     The significance of total cholesterol depends on the values of LDL, HDL, triglycerides and the clinical context. A patient-provider discussion may be considered.       09/03/2017 182 See Comment mg/dL Final     Comment:       The significance of total cholesterol depends on the values of LDL, HDL, triglycerides and the clinical context. A patient-provider discussion may be considered.       05/29/2016 205 See Comment mg/dL Final     Comment:       The significance of total cholesterol depends on the values of LDL, HDL, triglycerides and the clinical context. A patient-provider discussion may be considered.         Cholesterol,LDL,Calc   Date Value Ref Range Status   05/29/2016 74 <100 mg/dL Final     Comment:     If LDL value falls outside of the designated range AND if  included in any of the following categories, a  patient-provider discussion is recommended.     Statin therapy is recommended for individuals:  1. with clinical atherosclerotic cardiovascular disease     irrespective of LDL levels;  2. with LDL > or = 190 mg/dL;  3. with diabetes, aged 40-75 years, with LDL between 70 and     189 mg/dL;  4. without any of the above but who have LDL between 70 and     189 mg/dL and an estimated 16-XWRU risk of     atherosclerotic cardiovascular disease > or = 7.5%     (consider statin therapy if estimated 10-year risk > or =     5.0%) (ACC/AHA 2013 Guidelines).   10/23/2015 61 <100 mg/dL Final     Comment: If LDL value falls outside of the designated range AND if  included in any of the following categories, a  patient-provider discussion is recommended.     Statin therapy is recommended for individuals:  1. with clinical atherosclerotic cardiovascular disease     irrespective of LDL levels;  2. with LDL > or = 190 mg/dL;  3. with diabetes, aged 40-75 years, with LDL between 70 and     189 mg/dL;  4. without any of the above but who have LDL between 70 and     189 mg/dL and an estimated 04-VWUJ risk of  atherosclerotic cardiovascular disease > or = 7.5%     (consider statin therapy if estimated 10-year risk > or =     5.0%) (ACC/AHA 2013 Guidelines).   10/19/2014 55 <100 mg/dL Final     Comment:     If LDL value falls outside of the designated range or if  included in any of the following categories, a  patient-provider discussion is recommended.     Statin therapy is recommended for individuals:  1. with clinical atherosclerotic cardiovascular disease     irrespective of LDL levels;  2. with LDL > or = 190 mg/dL;  3. with diabetes, aged 40-75 years, with LDL between 70 and     189 mg/dL;  4. without any of the above but who have LDL between 70 and     189 mg/dL and an estimated 16-XWRU risk of     atherosclerotic cardiovascular disease > or = 7.5%     (consider statin therapy if estimated 10-year risk > or =     5.0%) (ACC/AHA 2013 Guidelines).     Cholesterol, HDL   Date Value Ref Range Status   01/27/2018 99 >40 mg/dL Final     Comment:     If HDL cholesterol level falls outside of the designated  range, a patient-provider discussion is recommended   09/03/2017 94 >40 mg/dL Final     Comment:     If HDL cholesterol level falls outside of the designated  range, a patient-provider discussion is recommended   05/29/2016 120 >40 mg/dL Final     Comment:     If HDL cholesterol level falls outside of the designated  range, a patient-provider discussion is recommended     Triglycerides   Date Value Ref Range Status 01/27/2018 87 <150 mg/dL Final     Comment:     If Triglyceride level falls outside of the designated range,  a patient-provider discussion is recommended.     09/03/2017 81 <150 mg/dL Final     Comment:     Patient is non-fasting, interpret with caution.    If Triglyceride level falls outside of the designated range,  a patient-provider discussion is recommended.     05/29/2016 54 <150 mg/dL Final     Comment:       If Triglyceride level falls outside of the designated range,  a patient-provider discussion is recommended.        Creatinine   Date Value Ref Range Status   01/27/2018 0.89 0.60 - 1.30 mg/dL Final   04/54/0981 1.91 0.60 - 1.30 mg/dL Final   47/82/9562 1.30 0.60 - 1.30 mg/dL Final        PHQ-9 Results  Depression Screening (Patient Health Questionnaire PHQ) 08/18/2012 10/19/2014 10/19/2014 09/11/2015 12/24/2016 01/22/2017 01/27/2018   PHQ-2: Feeling down, depressed, or hopeless - No No No No No No   PHQ-2: Little interest or pleassure in doing things - No No No No No No   Little interest or pleasure in doing things Not at all - - - - - -   Feeling down, depressed, or hopeless Not at all - - - - - -   Total Score 0 - - - - - -   Some recent data might be hidden       GAD-7 Results  No flowsheet data found.    DAST Results  No flowsheet data found.    Audit-C results  No flowsheet data found.      No follow-ups on file.  The above plan of care, diagnosis, orders, and follow-up were discussed with the patient.  Questions related to this recommended plan of care were answered.  Lonzo Cloud. Karolee Ohs, DO  02/22/2018      Author:  Lonzo Cloud. Javohn Basey 02/22/2018 1:29 PM

## 2018-02-22 NOTE — Telephone Encounter
Confirmation Documentation   (left msg)  Date: 03/01/2018  Time: 9:30AM  Check-in Time: 8:30AM    Colonoscopy  X   Endoscopy     Bravo    Cath Placement    Sigmoidoscopy     Small Bowel Entero.    Esophageal Manometry    Anorectal Manometry    Biofeedback    pH study    Capsule Endoscopy    Secretin Infusion Test    Sham Feeding Study        Informed pt:  1. Of Arrival time (time varies based on location)?  2. Of location and room number?  3. Make sure there is a confirmed ride for the procedure.  4. Has procedure been authorized?  5. Was MAC Script provided?   6. Reviewed Prep instructions with patient? Y/N?  7. Informed patient to call back with any questions and provided number.Y/N?    NOTE: If patients have any questions regarding prescribed medication patient needs to contact their prescribing physician to ensure it is ok to stop medications.      Comments/Notes:        If VM is left and pt calls back, please warm transfer patient to [phone]. This number is for internal use only and is not to be provided to patients.

## 2018-03-01 ENCOUNTER — Inpatient Hospital Stay
Admit: 2018-03-01 | Discharge: 2018-03-01 | Disposition: A | Discharge status: Home / Self Care | Payer: BLUE CROSS/BLUE SHIELD | Source: Home / Self Care | Attending: Student in an Organized Health Care Education/Training Program

## 2018-03-01 DIAGNOSIS — Z1211 Encounter for screening for malignant neoplasm of colon: Secondary | ICD-10-CM

## 2018-03-01 LAB — Glucose,POC: GLUCOSE,POC: 269 mg/dL — ABNORMAL HIGH (ref 65–99)

## 2018-03-01 MED ADMIN — PROPOFOL 200 MG/20ML IV EMUL: @ 18:00:00 | Stop: 2018-03-01 | NDC 63323026929

## 2018-03-01 MED ADMIN — SIMETHICONE 40 MG/0.6ML PO SUSP: @ 18:00:00 | Stop: 2018-03-01

## 2018-03-01 MED ADMIN — SODIUM CHLORIDE 0.9 % IV SOLN: INTRAVENOUS | @ 18:00:00 | Stop: 2018-03-01 | NDC 00338004904

## 2018-03-01 MED ADMIN — LIDOCAINE HCL (CARDIAC) PF 100 MG/5ML IV SOSY: INTRAVENOUS | @ 18:00:00 | Stop: 2018-03-01 | NDC 00409132305

## 2018-03-01 NOTE — Procedures
PATIENT NAME:       Chad Watkins, Chad Watkins  DATE OF BIRTH:       30-Sep-1945  RECORD NUMBER:      [id number]  DATE/TIME OF PROCEDURE:     03/01/2018 / 09:37:00 AM  ENDOSCOPIST:       Riccardo Dubin, MD  REFERRING PHYSICIAN:        FELLOW:           INDICATIONS FOR EXAMINATION:      Screening Colonoscopy.               PROCEDURE PERFORMED:             COLONOSCOPY - screening  COLONOSCOPY - snare polypectomy    MEDICATIONS:    MAC anesthesia    PROCEDURE TECHNIQUE:  Informed consent obtained for the procedure including risks, benefits and alternatives and the risk of those alternatives. Informed consent obtained for sedation including risks, benefits and alternatives and the risk of those alternatives. Pulse, pulse   oximetry, and blood pressure were monitored throughout the procedure. The endoscope passed with ease through the anus under direct visualization; it was advanced to the terminal ileum and cecum, confirmed by the appendiceal orifice and ileocecal valve.   The scope was withdrawn and the mucosa was carefully examined. The views were good. The patient's toleration of the procedure was good. Retroflexion was performed in the rectum.    TOTAL WITHDRAWL TIME:    TOTAL INSERTION TIME:      EXTENT OF EXAM:  terminal ileum            INSTRUMENTS:   #[id number] (C-11)  TECHNICAL DIFFICULTY:  No   LIMITATIONS:      None  TOLERANCE:   Good  VISUALIZATION:  Good    FINDINGS:   Normal mucosa in terminal ileum. 4 mm descending colon polyp, completely removed and retrieved by cold snare polypectomy. 8 mm descending colon polyp, completely removed and retrieved by cold snare polypectomy. Small internal hemorrhods noted. The   colonoscopy examination was otherwise normal.    ESTIMATED BLOOD LOSS:   None     DIAGNOSIS:  1. 4 mm descending colon polyp, completely removed and retrieved by cold snare polypectomy.   2. 8 mm descending colon polyp, completely removed and retrieved by cold snare polypectomy.  3. Small internal hemorrhoids. 4. Normal colonoscopy otherwise.    RECOMMENDATIONS:  1. Await pathology results  2. Anticipate repeat colonoscopy in 5 years  3. High fiber diet          This electronic signature authenticates all electronic and/or handwritten documentation, including orders, generated by the signer during the episode of care contained in this record.  03/01/2018 10:58:51 AM By Riccardo Dubin

## 2018-03-01 NOTE — H&P
GASTROENTEROLOGY PRE-PROCEDURE HISTORY AND PHYSICAL    Indication for Procedure:     Procedure: []  EGD   [x]  Colonoscopy  []  Enteroscopy  []  Flexible Sigmoidoscopy                         []  ERCP  []  EUS  []  Other:    Level of sedation intended for procedure:  []  Moderate  []  Deep  [x]  MAC  []  Anesthesia   Informed Consent Obtained Including Risks, Benefits & Alternatives:  [x]  Yes    Informed Consent Obtained For Sedation Including Risks, Benefits & Alternatives: [x]  Yes  BP 104/68  ~ Pulse (!) 104  ~ Temp 36.9 ???C (98.4 ???F) (Forehead)  ~ Resp 16  ~ Ht 5' 7'' (1.702 m)  ~ Wt 150 lb (68 kg)  ~ SpO2 98%  ~ BMI 23.49 kg/m???      History of Present Illness  Chad Watkins is a 73 y.o. male with diabetes mellitus, hypertension, here for screening colonscopy.    Past Medical History  Past Medical History:   Diagnosis Date   ??? Diabetic neuropathy (HCC/RAF) 01/27/2018   ??? Diabetic peripheral vascular disease (HCC/RAF) 09/03/2017   ??? DM type 1 with diabetic mixed hyperlipidemia (HCC/RAF) 09/03/2017   ??? History of fracture of left ankle 12/01/2017   ??? Hypertension associated with diabetes (HCC/RAF) 09/03/2017   ??? Peripheral vascular disease (HCC/RAF) 09/03/2017   ??? Trigger thumb 01/27/2018   ??? Type 1 diabetes mellitus with diabetic neuropathy, unspecified (HCC/RAF) 09/03/2017       Past Surgical History  History reviewed. No pertinent surgical history.    Medications  See medication list    Allergies  Patient has no known allergies.    Family History  History reviewed. No pertinent family history.    Social History  Social History     Tobacco Use   ??? Smoking status: Never Smoker   ??? Smokeless tobacco: Never Used   Substance Use Topics   ??? Alcohol use: Yes     Alcohol/week: 4.2 oz     Types: 7 Standard drinks or equivalent per week   ??? Drug use: Not on file       Review of Systems  A complete ROS was performed, pertinent positives and negative are in the HPI, remainder of 14 systems is negative    Physical Examination Vital Signs: BP 104/68  ~ Pulse (!) 104  ~ Temp 36.9 ???C (98.4 ???F) (Forehead)  ~ Resp 16  ~ Ht 5' 7'' (1.702 m)  ~ Wt 150 lb (68 kg)  ~ SpO2 98%  ~ BMI 23.49 kg/m???   Gen: No acute distress, answers questions appropriately.  HEENT: Anicteric sclera, oropharynx clear, moist mucous membranes  Neck: Trachea midline, good range of motion  Pulm: Clear to auscultation bilaterally, no wheezes, rales, or rhonchi  CV: Regular, no murmurs, gallops or rubs  Abd: Soft, nontender, nondistended, no rebound or guarding  Ext: No peripheral edema, clubbing, or cyanosis   Neuro: Alert and fully oriented, no focal neurologic deficits    Laboratory Data  Lab Results   Component Value Date    WBC 6.24 01/27/2018    HGB 12.4 (L) 01/27/2018    HCT 37.1 (L) 01/27/2018    MCV 91.6 01/27/2018    PLT 262 01/27/2018       Lab Results   Component Value Date    CREAT 0.89 01/27/2018    BUN 15 01/27/2018  NA 137 01/27/2018    K 4.8 01/27/2018    CL 97 01/27/2018    CO2 26 01/27/2018       Lab Results   Component Value Date    ALT 15 01/27/2018    AST 25 01/27/2018    ALKPHOS 69 01/27/2018    BILITOT 0.4 01/27/2018        Impression:  1. Screening colonoscopy    Recommendations  1.  Proceed with procedure.      ASA Classification: ASA 3 - Patient with moderate systemic disease with functional limitations    I have reviewed the history and physical and have determined Chad Watkins to be an appropriate candidate to undergo the planned procedure with sedation and analgesia.    Mycah Mcdougall B. Fredrika Canby   03/01/2018 10:04 AM

## 2018-03-01 NOTE — Progress Notes
Pt arrived PACU, alert, responsive to verbal, no acute/respiratory distress, attached cardiac monitor show SR w/PVCs with heart rate 60s. Pt denies CP, SOB, dizziness, HA, abdominal pain, n/v or tenderness. Encouraged pt pass flatus, lying left side. VSS. NAD. CMS intact. Daughter called for status. Safety/comfort measures given during PACU.

## 2018-03-01 NOTE — Progress Notes
Discharge w/aci given to pt/dtr in spanish version, both understand teaching and follow up instructions, along with s/s to report. NAD. Tolerated po intake, denies n/v. Passed flatus w/no difficulty. Steady gait. Awaiting anesthesia release order, VSS. Daughter to accompany patient home. BS checked at home, SS based on food intake, dtr understands.

## 2018-03-03 ENCOUNTER — Ambulatory Visit: Payer: BLUE CROSS/BLUE SHIELD | Attending: Hand Surgery

## 2018-03-03 DIAGNOSIS — M6530 Trigger finger, unspecified finger: Secondary | ICD-10-CM

## 2018-03-03 MED ADMIN — LIDOCAINE HCL (PF) 1 % IJ SOLN: 1 mL | SUBCUTANEOUS | @ 22:00:00 | Stop: 2018-03-03 | NDC 63323049227

## 2018-03-03 MED ADMIN — TRIAMCINOLONE ACETONIDE 10 MG/ML IJ SUSP: 10 mg | INTRASYNOVIAL | @ 22:00:00 | Stop: 2018-03-03 | NDC 00003049420

## 2018-03-04 LAB — Tissue Exam

## 2018-03-04 NOTE — Consults
DATE OF SERVICE:  03/03/2018     This 72 year old man has had pain and a bump at the base of his left thumb which has been bothering him. The rest of his left and right hand has not been bothering him. This has been going on for a number of weeks. He is here with his daughter.    ALLERGIES:  He has no known allergies.    PAST MEDICAL HISTORY:  He has been seen here for hypertension associated with diabetes, type 1 diabetes mixed with hyperlipidemia, peripheral vascular disease, diabetic neuropathy, and trigger thumb.    IMAGING:  X-rays were not obtained for this visit.    PHYSICAL EXAMINATION:  There is enlargement of the A1 pulley of the left thumb and tenderness in that area. There is a palpable nodule in the flexor tendon, and there is some limited flexion of the distal joint. Remainder of the exam is unremarkable.    PLAN:  I discussed diagnosis and prognosis with him and with his daughter. I injected the thumb with cortisone and lidocaine. I informed him that his blood sugar may go up for a day or 2 from this injection, so he should be aware of that. I have informed them that if he has any problems they should notify me, and if this is not successful in resolving his problem they should notify me.      Lorin Glass, MD [phone])        JL/MODL CONF#: [id number]  D: 03/03/2018 13:36:36 T: 03/03/2018 17:47:07 DOCUMENT: [id number]

## 2018-03-08 NOTE — H&P
PATIENT: Chad Watkins  MRN: [id number]  DOB: [birth year]  DATE OF SERVICE: 01/27/2018    CHIEF COMPLAINT:   Chief Complaint   Patient presents with   ??? Annual Exam       Subjective:      Chad Watkins is a 73 y.o. male.    Review of Systems      Objective:      Physical Exam    ASSESSMENT AND PLAN     1. Healthcare maintenance    2. Hypertension associated with diabetes (HCC/RAF)    3. DM type 1 with diabetic mixed hyperlipidemia (HCC/RAF)    4. DM (diabetes mellitus), type 1, uncontrolled w/neurologic complication (HCC/RAF)    5. DM (diabetes mellitus), type 1, uncontrolled, periph vascular complic (HCC/RAF)    6. Peripheral vascular disease (HCC/RAF)    7. Diabetic polyneuropathy associated with type 1 diabetes mellitus (HCC/RAF)    8. Trigger finger of left thumb        Orders Placed This Encounter   ??? TBOC - Venipuncture w/Collection & Handling   ??? CBC & Auto Differential   ??? Comprehensive Metabolic Panel   ??? Hgb A1c   ??? Lipid Panel   ??? TSH with reflex FT4, FT3   ??? Urinalysis w/Reflex to Culture   ??? Albumin/Creat Ratio Ur   ??? Vitamin B12   ??? Vitamin D,25-Hydroxy   ??? Folate,Serum   ??? Magnesium   ??? Uric Acid   ??? PSA,Free & Total Profile   ??? Testosterone, Free, Total and  SHBG, Men   ??? Iron & Iron Binding Capacity   ??? Ferritin   ??? Chol,LDL,Quant   ??? UA,Dipstick   ??? UA,Microscopic   ??? CBC   ??? Differential, Automated   ??? Referral to Gastroenterology   ??? Referral to Orthopaedic Surgery, Hand   ??? PROLENSA 0.07 % SOLN   ??? gatifloxacin 0.5% ophthalmic solution   ??? FLUZONE HIGH-DOSE 0.5 ML syringe   ??? LOTEMAX SM 0.38 % GEL       Wt Readings from Last 5 Encounters:   03/03/18 161 lb (73 kg)   03/01/18 150 lb (68 kg)   02/22/18 161 lb (73 kg)   01/31/18 156 lb (70.8 kg)   01/27/18 158 lb (71.7 kg)     BP Readings from Last 5 Encounters:   03/01/18 108/61   02/22/18 116/56   01/31/18 107/63   01/27/18 130/74   12/20/17 138/67       Hgb A1c - HPLC   Date Value Ref Range Status   01/27/2018 9.4 (H) <5.7 % Final     Comment: For patients with diabetes, an A1c less than (<) or equal (=) to 7.0% is recommended for most patients, however the goal may be higher or lower depending on age and/or other medical problems.   For a diagnosis of diabetes, A1c greater than (>) or equal(=) to 6.5% indicates diabetes; values between 5.7% and 6.4% may indicate an increased risk of developing diabetes.   12/20/2017 9.6 (H) <5.7 % Final     Comment:     For patients with diabetes, an A1c less than (<) or equal (=) to 7.0% is recommended for most patients, however the goal may be higher or lower depending on age and/or other medical problems.   For a diagnosis of diabetes, A1c greater than (>) or equal(=) to 6.5% indicates diabetes; values between 5.7% and 6.4% may indicate an increased risk of developing diabetes.   09/03/2017  8.7 (H) <5.7 % Final     Comment:     For patients with diabetes, an A1c less than (<) or equal (=) to 7.0% is recommended for most patients, however the goal may be higher or lower depending on age and/or other medical problems.   For a diagnosis of diabetes, A1c greater than (>) or equal(=) to 6.5% indicates diabetes; values between 5.7% and 6.4% may indicate an increased risk of developing diabetes.     Hemoglobin A1C, Manual   Date Value Ref Range Status   11/02/2017 8.1 (A) 4 - 6.5 % Final     Cholesterol   Date Value Ref Range Status   01/27/2018 185 See Comment mg/dL Final     Comment:     The significance of total cholesterol depends on the values of LDL, HDL, triglycerides and the clinical context. A patient-provider discussion may be considered.       09/03/2017 182 See Comment mg/dL Final     Comment:       The significance of total cholesterol depends on the values of LDL, HDL, triglycerides and the clinical context. A patient-provider discussion may be considered.       05/29/2016 205 See Comment mg/dL Final     Comment:       The significance of total cholesterol depends on the values of LDL, HDL, triglycerides and the clinical context. A patient-provider discussion may be considered.         Cholesterol,LDL,Calc   Date Value Ref Range Status   05/29/2016 74 <100 mg/dL Final     Comment:     If LDL value falls outside of the designated range AND if  included in any of the following categories, a  patient-provider discussion is recommended.     Statin therapy is recommended for individuals:  1. with clinical atherosclerotic cardiovascular disease     irrespective of LDL levels;  2. with LDL > or = 190 mg/dL;  3. with diabetes, aged 40-75 years, with LDL between 70 and     189 mg/dL;  4. without any of the above but who have LDL between 70 and     189 mg/dL and an estimated 81-XBJY risk of     atherosclerotic cardiovascular disease > or = 7.5%     (consider statin therapy if estimated 10-year risk > or =     5.0%) (ACC/AHA 2013 Guidelines).   10/23/2015 61 <100 mg/dL Final     Comment:     If LDL value falls outside of the designated range AND if  included in any of the following categories, a  patient-provider discussion is recommended.     Statin therapy is recommended for individuals:  1. with clinical atherosclerotic cardiovascular disease     irrespective of LDL levels;  2. with LDL > or = 190 mg/dL;  3. with diabetes, aged 40-75 years, with LDL between 70 and     189 mg/dL;  4. without any of the above but who have LDL between 70 and     189 mg/dL and an estimated 78-GNFA risk of     atherosclerotic cardiovascular disease > or = 7.5%     (consider statin therapy if estimated 10-year risk > or =     5.0%) (ACC/AHA 2013 Guidelines).   10/19/2014 55 <100 mg/dL Final     Comment:     If LDL value falls outside of the designated range or if  included in any of the following  categories, a  patient-provider discussion is recommended.     Statin therapy is recommended for individuals:  1. with clinical atherosclerotic cardiovascular disease     irrespective of LDL levels;  2. with LDL > or = 190 mg/dL; 3. with diabetes, aged 40-75 years, with LDL between 70 and     189 mg/dL;  4. without any of the above but who have LDL between 70 and     189 mg/dL and an estimated 45-WUJW risk of     atherosclerotic cardiovascular disease > or = 7.5%     (consider statin therapy if estimated 10-year risk > or =     5.0%) (ACC/AHA 2013 Guidelines).     Cholesterol, HDL   Date Value Ref Range Status   01/27/2018 99 >40 mg/dL Final     Comment:     If HDL cholesterol level falls outside of the designated  range, a patient-provider discussion is recommended   09/03/2017 94 >40 mg/dL Final     Comment:     If HDL cholesterol level falls outside of the designated  range, a patient-provider discussion is recommended   05/29/2016 120 >40 mg/dL Final     Comment:     If HDL cholesterol level falls outside of the designated  range, a patient-provider discussion is recommended     Triglycerides   Date Value Ref Range Status   01/27/2018 87 <150 mg/dL Final     Comment:     If Triglyceride level falls outside of the designated range,  a patient-provider discussion is recommended.     09/03/2017 81 <150 mg/dL Final     Comment:     Patient is non-fasting, interpret with caution.    If Triglyceride level falls outside of the designated range,  a patient-provider discussion is recommended.     05/29/2016 54 <150 mg/dL Final     Comment:       If Triglyceride level falls outside of the designated range,  a patient-provider discussion is recommended.        Creatinine   Date Value Ref Range Status   01/27/2018 0.89 0.60 - 1.30 mg/dL Final   11/91/4782 9.56 0.60 - 1.30 mg/dL Final   21/30/8657 8.46 0.60 - 1.30 mg/dL Final        PHQ-9 Results  Depression Screening (Patient Health Questionnaire PHQ) 08/18/2012 10/19/2014 10/19/2014 09/11/2015 12/24/2016 01/22/2017 01/27/2018   PHQ-2: Feeling down, depressed, or hopeless - No No No No No No   PHQ-2: Little interest or pleassure in doing things - No No No No No No Little interest or pleasure in doing things Not at all - - - - - -   Feeling down, depressed, or hopeless Not at all - - - - - -   Total Score 0 - - - - - -   Some recent data might be hidden       GAD-7 Results  No flowsheet data found.    DAST Results  No flowsheet data found.    Audit-C results  No flowsheet data found.      No follow-ups on file.  The above plan of care, diagnosis, orders, and follow-up were discussed with the patient.  Questions related to this recommended plan of care were answered.  Lonzo Cloud. Karolee Ohs, DO  03/07/2018    Author:  Lonzo Cloud. Haiden Rawlinson 03/07/2018 4:00 PM

## 2018-03-16 MED ORDER — METFORMIN HCL ER 500 MG PO TB24
1000 mg | ORAL_TABLET | Freq: Two times a day (BID) | ORAL | 0 refills | Status: AC
Start: 2018-03-16 — End: 2018-08-19

## 2018-04-05 ENCOUNTER — Ambulatory Visit: Payer: BLUE CROSS/BLUE SHIELD

## 2018-04-07 ENCOUNTER — Telehealth: Payer: BLUE CROSS/BLUE SHIELD

## 2018-04-07 NOTE — Telephone Encounter
Call Back Request    MD:  Dr. Roland Earl     Reason for call back: Pt wants to know if he should continue taking AMLODIPINE-BENAZEPRIL 5-20 mg capsule, or if he should stop. He has no more medication left and is requesting a refill if Dr. Roland Earl wants him to continue taking it.   Please assist.     Any Symptoms:  []  Yes  [x]  No      ? If yes, what symptoms are you experiencing:    o Duration of symptoms (how long):    o Have you taken medication for symptoms (OTC or Rx):      Patient or caller has been notified of the 24-48 hour turnaround time.

## 2018-04-08 MED ORDER — AMLODIPINE BESY-BENAZEPRIL HCL 5-20 MG PO CAPS
1 | ORAL_CAPSULE | Freq: Two times a day (BID) | ORAL | 1 refills | Status: AC
Start: 2018-04-08 — End: 2018-09-24

## 2018-04-08 NOTE — Telephone Encounter
L/m in spanish informing pt. Call if any? Marland Kitchen ec

## 2018-04-08 NOTE — Telephone Encounter
Yes continue.  Will call in refill

## 2018-04-08 NOTE — Telephone Encounter
See prior message

## 2018-04-12 MED ORDER — HUMALOG 100 UNIT/ML SC SOLN
1 refills | Status: AC
Start: 2018-04-12 — End: 2018-09-20

## 2018-04-14 ENCOUNTER — Ambulatory Visit: Payer: BLUE CROSS/BLUE SHIELD

## 2018-04-14 ENCOUNTER — Telehealth: Payer: BLUE CROSS/BLUE SHIELD

## 2018-04-14 DIAGNOSIS — G8929 Other chronic pain: Secondary | ICD-10-CM

## 2018-04-14 DIAGNOSIS — L603 Nail dystrophy: Secondary | ICD-10-CM

## 2018-04-14 DIAGNOSIS — S82892A Other fracture of left lower leg, initial encounter for closed fracture: Secondary | ICD-10-CM

## 2018-04-14 DIAGNOSIS — M25572 Pain in left ankle and joints of left foot: Secondary | ICD-10-CM

## 2018-04-14 DIAGNOSIS — B351 Tinea unguium: Secondary | ICD-10-CM

## 2018-04-14 MED ORDER — CICLOPIROX 8 % EX SOLN
Freq: Every evening | TOPICAL | 3 refills | Status: AC
Start: 2018-04-14 — End: ?

## 2018-04-14 MED ORDER — INSULIN SYRINGE-NEEDLE U-100 31G X 5/16'' 0.3 ML MISC
1 | Freq: Every day | SUBCUTANEOUS | 3 refills | Status: AC
Start: 2018-04-14 — End: 2018-06-16

## 2018-04-14 NOTE — Telephone Encounter
Received hardcopy refill request for insulin syringe request was recently approved

## 2018-04-14 NOTE — Progress Notes
Subjective    Chad Watkins is here to follow up ankle fracture 1 year ago that has not healed.  he states the condition has significantly improved with the use of the boot and the bone stimulator.  He reports he is able to walk 10 blocks without pain or swelling in the ankle.  He is anxious to return to work.  he is here for further evaluation.    No recent changes in [facility] or FH.    Past Medical History:   Diagnosis Date   ??? Diabetic neuropathy (HCC/RAF) 01/27/2018   ??? Diabetic peripheral vascular disease (HCC/RAF) 09/03/2017   ??? DM type 1 with diabetic mixed hyperlipidemia (HCC/RAF) 09/03/2017   ??? History of fracture of left ankle 12/01/2017   ??? Hypertension associated with diabetes (HCC/RAF) 09/03/2017   ??? Peripheral vascular disease (HCC/RAF) 09/03/2017   ??? Trigger thumb 01/27/2018   ??? Type 1 diabetes mellitus with diabetic neuropathy, unspecified (HCC/RAF) 09/03/2017       Outpatient Medications Prior to Visit   Medication Sig   ??? acetaminophen-codeine (TYLENOL #3) 300-30 mg tablet TAKE 1 TABLET EVERY 4 HOURS AS NEEDED FOR PAIN   ??? amLODIPine-benazepril 5-20 mg capsule Take 1 capsule by mouth two (2) times daily.   ??? FLUZONE HIGH-DOSE 0.5 ML syringe    ??? gatifloxacin 0.5% ophthalmic solution    ??? glucose blood test strip Use as instructed   ??? HUMALOG 100 unit/mL injection vial USE 6 UNITS SUBCUTANEOUSLY 3 TIMES DAILY BEFORE MEALS.   ??? insulin syringe needle U-100 (BD INSULIN SYRINGE U/F) 31G X 5/16'' 0.3 mL syringe Inject 1 each under the skin five (5) times daily.   ??? LANTUS 100 unit/mL injection vial INJECT 0.2 MLS (20 UNITS TOTAL) UNDER THE SKIN AT BEDTIME.. (Patient taking differently: Inject under the skin at bedtime 13 units.)   ??? LANTUS 100 unit/mL injection vial INJECT 20 UNITS TOTAL UNDER THE SKIN AT BEDTIME.Marland Kitchen   ??? LOTEMAX SM 0.38 % GEL    ??? metFORMIN (GLUCOPHAGE XR) 500 mg PO ER 24 hr tablet TAKE 2 TABLETS (1,000 MG TOTAL) BY MOUTH TWO (2) TIMES DAILY WITH MEALS. ??? ONETOUCH ULTRA BLUE test strip USE 1 TEST STRIP TESTING 5 TIMES A DAY.   ??? PROLENSA 0.07 % SOLN    ??? SHINGRIX 50 MCG/0.5ML injection    ??? simvastatin (ZOCOR) 20 mg tablet TOME UNA TABLETA DIARIAMENTE   ??? SIMVASTATIN 20 mg tablet TOME UNA TABLETA TODOS LOS DIAS AL ACOSTARSE     No facility-administered medications prior to visit.      PHYSICAL EXAM:  GEN: no acute distress alert/oriented x3 appropriate mood and affect looks stated age well nourished  VASC:  Dorsalis Pedis:  present  Posterior Tibial:  present  There is brisk capillary refill time to all digits less than 3 seconds.     DERM:   edema  moderate Left , ecchymosis is not observed, Erythema is none.  NEURO:  protective sensation is unchanged from previous visit.  MSK:  Muscle strength 5/5 in all 4 quadrants.    Ankle ROM is normal, Subtalar joint ROM is normal.  There is no tenderness to palpation of the L ankle at the fibula.    IMAGING:  There is more bone consolidation noted with significantly more trabeculation noted across the fracture site.    Assessment /Plan   Chad Watkins is a 73 y.o. year old male who presents with:  1. Closed fracture of left ankle, initial encounter  XR ankle ap+lat+mortise left (3 views)   2. Chronic pain of left ankle  XR ankle ap+lat+mortise left (3 views)   3. Onychodystrophy     4. Onychomycosis     4.      Non union fracture of the fibula L      Discussed findings with pt  Patient ok to return to work without restrictions  Continue the use of the bone stimulator for 2 more months  All questions were answered to patients satisfaction and understanding  F/u 6 wks.        Khiem Gargis [name], DPM  04/14/2018

## 2018-05-27 MED ORDER — SILDENAFIL CITRATE 100 MG PO TABS
100 mg | ORAL_TABLET | Freq: Every day | ORAL | 10 refills | PRN
Start: 2018-05-27 — End: ?

## 2018-06-01 MED ORDER — SILDENAFIL CITRATE 100 MG PO TABS
100 mg | ORAL_TABLET | Freq: Every day | ORAL | 10 refills | Status: AC | PRN
Start: 2018-06-01 — End: ?

## 2018-06-15 MED ORDER — BD INSULIN SYRINGE U/F 31G X 5/16" 0.3 ML MISC
1 refills | Status: AC
Start: 2018-06-15 — End: 2018-09-20

## 2018-06-30 ENCOUNTER — Ambulatory Visit: Payer: BLUE CROSS/BLUE SHIELD

## 2018-06-30 DIAGNOSIS — S82892A Other fracture of left lower leg, initial encounter for closed fracture: Secondary | ICD-10-CM

## 2018-06-30 NOTE — Progress Notes
Subjective    Chad Watkins is here to follow up ankle fracture 1 year ago that has not healed.  he states the condition has significantly improved with the use of the boot and the bone stimulator.  He is doing much better overall.  No recent changes in Chad Watkins or FH.    Past Medical History:   Diagnosis Date   ??? Diabetic neuropathy (HCC/RAF) 01/27/2018   ??? Diabetic peripheral vascular disease (HCC/RAF) 09/03/2017   ??? DM type 1 with diabetic mixed hyperlipidemia (HCC/RAF) 09/03/2017   ??? History of fracture of left ankle 12/01/2017   ??? Hypertension associated with diabetes (HCC/RAF) 09/03/2017   ??? Peripheral vascular disease (HCC/RAF) 09/03/2017   ??? Trigger thumb 01/27/2018   ??? Type 1 diabetes mellitus with diabetic neuropathy, unspecified (HCC/RAF) 09/03/2017       Outpatient Medications Prior to Visit   Medication Sig   ??? acetaminophen-codeine (TYLENOL #3) 300-30 mg tablet TAKE 1 TABLET EVERY 4 HOURS AS NEEDED FOR PAIN   ??? amLODIPine-benazepril 5-20 mg capsule Take 1 capsule by mouth two (2) times daily.   ??? BD INSULIN SYRINGE U/F 31G X 5/16'' 0.3 ML syringe INJECT 1 EACH UNDER THE SKIN FIVE (5) TIMES DAILY.   ??? ciclopirox 8% solution Apply topically at bedtime Apply over nail and surrounding skin.Marland Kitchen   ??? FLUZONE HIGH-DOSE 0.5 ML syringe    ??? gatifloxacin 0.5% ophthalmic solution    ??? glucose blood test strip Use as instructed   ??? HUMALOG 100 unit/mL injection vial USE 6 UNITS SUBCUTANEOUSLY 3 TIMES DAILY BEFORE MEALS.   ??? LANTUS 100 unit/mL injection vial INJECT 0.2 MLS (20 UNITS TOTAL) UNDER THE SKIN AT BEDTIME.. (Patient taking differently: Inject under the skin at bedtime 13 units.)   ??? LANTUS 100 unit/mL injection vial INJECT 20 UNITS TOTAL UNDER THE SKIN AT BEDTIME.Marland Kitchen   ??? LOTEMAX SM 0.38 % GEL    ??? metFORMIN (GLUCOPHAGE XR) 500 mg PO ER 24 hr tablet TAKE 2 TABLETS (1,000 MG TOTAL) BY MOUTH TWO (2) TIMES DAILY WITH MEALS.   ??? ONETOUCH ULTRA BLUE test strip USE 1 TEST STRIP TESTING 5 TIMES A DAY.   ??? PROLENSA 0.07 % SOLN ??? SHINGRIX 50 MCG/0.5ML injection    ??? sildenafil 100 mg tablet Take 1 tablet (100 mg total) by mouth daily as needed for Erectile Dysfunction.   ??? simvastatin (ZOCOR) 20 mg tablet TOME UNA TABLETA DIARIAMENTE   ??? SIMVASTATIN 20 mg tablet TOME UNA TABLETA TODOS LOS DIAS AL ACOSTARSE     No facility-administered medications prior to visit.      PHYSICAL EXAM:  GEN: no acute distress alert/oriented x3 appropriate mood and affect looks stated age well nourished  VASC:  Dorsalis Pedis:  present  Posterior Tibial:  present  There is brisk capillary refill time to all digits less than 3 seconds.     DERM:   edema  moderate Left , ecchymosis is not observed, Erythema is none.  NEURO:  protective sensation is unchanged from previous visit.  MSK:  Muscle strength 5/5 in all 4 quadrants.    Ankle ROM is normal, Subtalar joint ROM is normal.  There is no tenderness to palpation of the L ankle at the fibula.    IMAGING:  There is more bone consolidation noted with significantly more trabeculation noted across the fracture site.    Assessment /Plan   Chad Watkins is a 73 y.o. year old male who presents with:  1. Closed fracture of  left ankle, initial encounter  XR ankle ap+lat+mortise left (3 views)   4.      Non union fracture of the fibula L      Discussed findings with pt  Patient ok to return to work without restrictions  Continue the use of the bone stimulator for 1 more month  All questions were answered to patients satisfaction and understanding  F/u 6 wks.        Veronika Heard [name], DPM  06/30/2018

## 2018-07-02 MED ORDER — SIMVASTATIN 20 MG PO TABS
ORAL_TABLET | 4 refills
Start: 2018-07-02 — End: ?

## 2018-07-04 MED ORDER — SIMVASTATIN 20 MG PO TABS
ORAL_TABLET | 4 refills
Start: 2018-07-04 — End: ?

## 2018-07-06 ENCOUNTER — Telehealth: Payer: BLUE CROSS/BLUE SHIELD

## 2018-07-07 NOTE — Telephone Encounter
Call Back Request    MD:  Dr. Roland Earl    Reason for call back: Patient's daughter calling behalf of patient regarding metformin medication, since it has been recall should patient continue to take it or would doctor recommend or prescribe another medication.   States during last patient visit, patient states still retaining water in feet and legs, doctor mention about prescribing medication for it. Would he need another visit for doctor to prescribe medication?    Also if doctor would prescribed BAQSIMI for him since Dr. Rosana Hoes retired?        [phone]    Any Symptoms:  []  Yes  []  No      ? If yes, what symptoms are you experiencing:    o Duration of symptoms (how long):    o Have you taken medication for symptoms (OTC or Rx):      Patient or caller has been notified of the 24-48 hour turnaround time.

## 2018-07-10 NOTE — Telephone Encounter
1) no need to change metformin unless pharmacy tells them directly that what he received was in one of the recalled batches  2) patient needs diabetic follow up so we can discuss the other issues when he is here for that

## 2018-07-11 NOTE — Telephone Encounter
Daughter informed. Patient scheduled for follow up.

## 2018-07-11 NOTE — Telephone Encounter
Call pt he  is sleeping l/m to call back re: below messg. ec

## 2018-07-14 ENCOUNTER — Ambulatory Visit: Payer: BLUE CROSS/BLUE SHIELD

## 2018-07-14 DIAGNOSIS — R600 Localized edema: Secondary | ICD-10-CM

## 2018-07-14 DIAGNOSIS — E1042 Type 1 diabetes mellitus with diabetic polyneuropathy: Secondary | ICD-10-CM

## 2018-07-14 MED ORDER — GLUCAGON 3 MG/DOSE NA POWD
1 | NASAL | 1 refills | Status: AC | PRN
Start: 2018-07-14 — End: ?

## 2018-07-14 MED ORDER — FUROSEMIDE 20 MG PO TABS
20 mg | ORAL_TABLET | Freq: Every morning | ORAL | 0 refills | Status: AC
Start: 2018-07-14 — End: ?

## 2018-07-14 NOTE — Patient Instructions
1) STOP lantus  2) increase metformin to 1000 mg (2 pills) TWICE a day  3) follow this humalog scale checking before each meal and before bed:    SUGAR LEVEL                                  HUMALOG UNITS  <70                                                       EAT OR DRINK, NO INSULIN  70-150                                                  NO INSULIN  150-200                                                2 UNITS  200-250                                                4 UNITS  250-300                                                6 UNITS  300-350                                                8 UNITS  350-400                                                10 UNITS      FOLLOW UP IN 2-3 WEEKS WITH A HANDWRITTEN SUGAR AND HUMALOG REPORT.

## 2018-07-15 LAB — Vitamin D,25-Hydroxy: VITAMIN D,25-HYDROXY: 52 ng/mL — ABNORMAL HIGH (ref 20–50)

## 2018-07-15 LAB — Differential Automated: ABSOLUTE EOS COUNT: 0.13 10*3/uL (ref 0.00–0.50)

## 2018-07-15 LAB — UA,Dipstick: GLUCOSE: NEGATIVE (ref 5.0–8.0)

## 2018-07-15 LAB — Lipid Panel: CHOLESTEROL: 165 mg/dL (ref >40–<100)

## 2018-07-15 LAB — Comprehensive Metabolic Panel
TOTAL PROTEIN: 7.5 g/dL (ref 6.1–8.2)
UREA NITROGEN: 12 mg/dL (ref 7–22)

## 2018-07-15 LAB — CBC: PLATELET COUNT, AUTO: 241 10*3/uL (ref 143–398)

## 2018-07-15 LAB — UA,Microscopic: SQUAMOUS EPITHELIAL CELLS: 0 {cells}/uL (ref 0–17)

## 2018-07-15 LAB — Hgb A1c: HGB A1C - HPLC: 7.5 — ABNORMAL HIGH (ref <5.7)

## 2018-07-15 LAB — Testosterone: TESTOSTERONE: 711 ng/dL (ref 200–1000)

## 2018-07-15 LAB — Uric Acid: URIC ACID: 4.5 mg/dL (ref 3.4–8.8)

## 2018-07-15 LAB — Albumin/Creatinine Ratio,Urine: ALBUMIN,URINE: <12 mg/L

## 2018-07-15 LAB — Magnesium: MAGNESIUM: 1.6 meq/L (ref 1.4–1.9)

## 2018-07-15 LAB — PTH, Intact: PTH, INTACT: 33 pg/mL (ref 11–51)

## 2018-07-16 LAB — Vitamin B12: VITAMIN B12: 667 pg/mL (ref 254–1060)

## 2018-07-16 LAB — Folate,Serum: FOLATE,SERUM: 18.4 ng/mL (ref 8.1–30.4)

## 2018-07-16 LAB — TSH with reflex FT4, FT3: TSH: 0.56 u[IU]/mL (ref 0.3–4.7)

## 2018-07-16 LAB — PSA,Screening: PSA,SCREENING: 1.5 ng/mL (ref 0–6.5)

## 2018-07-17 NOTE — Progress Notes
PATIENT: Chad Watkins  MRN: [id number]  DOB: Jan 06, 1946  DATE OF SERVICE: 07/14/2018    CHIEF COMPLAINT:   Chief Complaint   Patient presents with   ??? Follow-up      Diabetes; 12 units lantus daily; humalog pre meal - 100 = 3 units; 200 - 7 units; 300 = 9 units   ??? Diabetes     frequent hypoglycemic episodes and inconsistent dieting.  does like drinking tequila.  also work schedule makes tough.  very set in ways but frequently gets to point of unconsciousness or stuporous.  family uses glucagon nasal spray.         Past Medical History:   Diagnosis Date   ??? Diabetic neuropathy (HCC/RAF) 01/27/2018   ??? Diabetic peripheral vascular disease (HCC/RAF) 09/03/2017   ??? DM type 1 with diabetic mixed hyperlipidemia (HCC/RAF) 09/03/2017   ??? Edema leg 07/14/2018   ??? History of fracture of left ankle 12/01/2017   ??? Hypertension associated with diabetes (HCC/RAF) 09/03/2017   ??? Peripheral vascular disease (HCC/RAF) 09/03/2017   ??? Trigger thumb 01/27/2018   ??? Type 1 diabetes mellitus with diabetic neuropathy, unspecified (HCC/RAF) 09/03/2017       No past surgical history on file.    Social History     Tobacco Use   Smoking Status Never Smoker   Smokeless Tobacco Never Used       No family history on file.    Social History     Substance and Sexual Activity   Alcohol Use Yes   ??? Alcohol/week: 4.2 oz   ??? Types: 7 Standard drinks or equivalent per week       Social History     Substance and Sexual Activity   Drug Use Not on file       No Known Allergies    Patient Active Problem List   Diagnosis   ??? Bursitis   ??? Skin cancer   ??? Hyperlipidemia   ??? Palpitation   ??? Need for vaccination   ??? Healthcare maintenance   ??? Hypertension, well controlled   ??? Abnormal EKG   ??? Left ankle pain   ??? Hypertension associated with diabetes (HCC/RAF)   ??? DM type 1 with diabetic mixed hyperlipidemia (HCC/RAF)   ??? DM (diabetes mellitus), type 1, uncontrolled, periph vascular complic (HCC/RAF)   ??? Peripheral vascular disease (HCC/RAF) ??? DM (diabetes mellitus), type 1, uncontrolled w/neurologic complication (HCC/RAF)   ??? History of fracture of left ankle   ??? Diabetic neuropathy (HCC/RAF)   ??? Trigger thumb   ??? Edema leg         Current Outpatient Medications:   ???  acetaminophen-codeine (TYLENOL #3) 300-30 mg tablet, TAKE 1 TABLET EVERY 4 HOURS AS NEEDED FOR PAIN, Disp: , Rfl: 0  ???  amLODIPine-benazepril 5-20 mg capsule, Take 1 capsule by mouth two (2) times daily., Disp: 180 capsule, Rfl: 1  ???  BD INSULIN SYRINGE U/F 31G X 5/16'' 0.3 ML syringe, INJECT 1 EACH UNDER THE SKIN FIVE (5) TIMES DAILY., Disp: 100 each, Rfl: 1  ???  ciclopirox 8% solution, Apply topically at bedtime Apply over nail and surrounding skin.., Disp: 1 bottle, Rfl: 3  ???  FLUZONE HIGH-DOSE 0.5 ML syringe, , Disp: , Rfl:   ???  furosemide 20 mg tablet, Take 1 tablet (20 mg total) by mouth every morning., Disp: 90 tablet, Rfl: 0  ???  gatifloxacin 0.5% ophthalmic solution, , Disp: , Rfl:   ???  glucagon (BAQSIMI TWO PACK)  3 mg/dose nasal powder, 1 actuation (3 mg total) by Left Nare route every fifteen (15) minutes as needed., Disp: 3 each, Rfl: 1  ???  glucose blood test strip, Use as instructed, Disp: 100 each, Rfl: 12  ???  HUMALOG 100 unit/mL injection vial, USE 6 UNITS SUBCUTANEOUSLY 3 TIMES DAILY BEFORE MEALS., Disp: 30 mL, Rfl: 1  ???  LANTUS 100 unit/mL injection vial, INJECT 0.2 MLS (20 UNITS TOTAL) UNDER THE SKIN AT BEDTIME.. (Patient taking differently: Inject under the skin at bedtime 13 units.), Disp: 20 mL, Rfl: 1  ???  LANTUS 100 unit/mL injection vial, INJECT 20 UNITS TOTAL UNDER THE SKIN AT BEDTIME.., Disp: 20 mL, Rfl: 1  ???  LOTEMAX SM 0.38 % GEL, , Disp: , Rfl:   ???  metFORMIN (GLUCOPHAGE XR) 500 mg PO ER 24 hr tablet, TAKE 2 TABLETS (1,000 MG TOTAL) BY MOUTH TWO (2) TIMES DAILY WITH MEALS., Disp: 360 tablet, Rfl: 0  ???  ONETOUCH ULTRA BLUE test strip, USE 1 TEST STRIP TESTING 5 TIMES A DAY., Disp: 500 strip, Rfl: 3  ???  PROLENSA 0.07 % SOLN, , Disp: , Rfl: ???  SHINGRIX 50 MCG/0.5ML injection, , Disp: , Rfl:   ???  sildenafil 100 mg tablet, Take 1 tablet (100 mg total) by mouth daily as needed for Erectile Dysfunction., Disp: 10 tablet, Rfl: 10  ???  simvastatin (ZOCOR) 20 mg tablet, TOME UNA TABLETA DIARIAMENTE, Disp: 90 tablet, Rfl: 3  ???  SIMVASTATIN 20 mg tablet, TOME UNA TABLETA TODOS LOS DIAS AL ACOSTARSE, Disp: 90 tablet, Rfl: 3    Health Maintenance   Topic Date Due   ??? Advance Directive  05/19/2009   ??? Diabetes: EYE EXAM  11/03/2018   ??? Diabetes: HGB A1C  01/13/2019   ??? Annual Preventive Wellness Visit  01/28/2019   ??? Diabetes: FOOT EXAM  06/30/2019   ??? Diabetes: NEPHROPATHY MONITORING  07/14/2019   ??? Tdap/Td Vaccine (2 - Td) 07/19/2022   ??? Colon Ca Screening: COLONOSCOPY  03/02/2023   ??? Influenza Vaccine  Completed   ??? Pneumococcal Vaccine  Completed   ??? Hepatitis C Screening  Completed   ??? Shingles (Shingrix) Vaccine  Completed   ??? Statin prescribed for ASCVD Prevention or Treatment  Completed       Patient Care Team:  Trena Platt., DO as PCP - General (Family Medicine)      Subjective:      Chad Watkins is a 73 y.o. male.    Review of Systems   Constitutional: Positive for activity change, appetite change, diaphoresis and fatigue. Negative for chills, fever and unexpected weight change.   HENT: Negative.  Negative for congestion, dental problem, drooling, ear discharge, ear pain, facial swelling, hearing loss, mouth sores, nosebleeds, postnasal drip, rhinorrhea, sinus pressure, sinus pain, sneezing, sore throat, tinnitus, trouble swallowing and voice change.    Eyes: Negative.  Negative for photophobia, pain, discharge, redness, itching and visual disturbance.   Respiratory: Negative.  Negative for apnea, cough, choking, chest tightness, shortness of breath, wheezing and stridor.    Cardiovascular: Negative.  Negative for chest pain, palpitations and leg swelling.   Gastrointestinal: Negative.  Negative for abdominal distention, abdominal pain, anal bleeding, blood in stool, constipation, diarrhea, nausea, rectal pain and vomiting.   Endocrine: Negative.  Negative for cold intolerance, heat intolerance, polydipsia, polyphagia and polyuria.   Genitourinary: Negative.  Negative for decreased urine volume, difficulty urinating, discharge, dysuria, enuresis, flank pain, frequency, genital sores, hematuria, penile pain,  penile swelling, scrotal swelling, testicular pain and urgency.   Musculoskeletal: Negative.  Negative for arthralgias, back pain, gait problem, joint swelling, myalgias, neck pain and neck stiffness.   Skin: Negative.  Negative for color change, pallor, rash and wound.   Allergic/Immunologic: Negative.  Negative for environmental allergies, food allergies and immunocompromised state.   Neurological: Negative.  Negative for dizziness, tremors, seizures, syncope, facial asymmetry, speech difficulty, weakness, light-headedness, numbness and headaches.   Hematological: Negative.  Negative for adenopathy. Does not bruise/bleed easily.   Psychiatric/Behavioral: Negative.  Negative for agitation, behavioral problems, confusion, decreased concentration, dysphoric mood, hallucinations, self-injury, sleep disturbance and suicidal ideas. The patient is not nervous/anxious and is not hyperactive.    All other systems reviewed and are negative.        Objective:      Physical Exam  Vitals signs reviewed.   Constitutional:       General: He is not in acute distress.     Appearance: Normal appearance. He is well-developed and normal weight. He is not ill-appearing, toxic-appearing or diaphoretic.      Comments: BP 129/51  ~ Pulse 65  ~ Temp 36.8 ???C (98.2 ???F) (Temporal)  ~ Wt 157 lb (71.2 kg)  ~ BMI 26.13 kg/m???     Spanish speaking.  Here with daughter   HENT:      Head: Normocephalic and atraumatic.      Right Ear: External ear normal.      Left Ear: External ear normal.      Nose: Nose normal.   Eyes:      Conjunctiva/sclera: Conjunctivae normal. Neck:      Musculoskeletal: Normal range of motion and neck supple. No neck rigidity or muscular tenderness.   Pulmonary:      Effort: Pulmonary effort is normal.   Lymphadenopathy:      Cervical: No cervical adenopathy.   Skin:     General: Skin is warm and dry.   Neurological:      General: No focal deficit present.      Mental Status: He is alert and oriented to person, place, and time.   Psychiatric:         Mood and Affect: Mood normal.         Behavior: Behavior normal.         Thought Content: Thought content normal.         Judgment: Judgment normal.             ASSESSMENT AND PLAN     1. Hypertension associated with diabetes (HCC/RAF)    2. DM type 1 with diabetic mixed hyperlipidemia (HCC/RAF)    3. DM (diabetes mellitus), type 1, uncontrolled w/neurologic complication (HCC/RAF)    4. DM (diabetes mellitus), type 1, uncontrolled, periph vascular complic (HCC/RAF)    5. Diabetic polyneuropathy associated with type 1 diabetes mellitus (HCC/RAF)    6. Peripheral vascular disease (HCC/RAF)    7. Healthcare maintenance    8. Edema leg      Very concerning hypoglycemic episodes frequently.  Really need to adjust medications to reduce chance of hypoglycemia.  Stop lantus at this point until can see needs.  Highly recommend to endo and nutrition to fit lifestyle appropriately and safely with diabetes.  Will max out metformin though patient did have minimal to no c-peptide.  Then gave sliding scale for novolog short acting to spot with - metformin should make this insulin more effective.  F/u 2 weeks with sugar log to adjust  as necessary - instructions were given in AVS.      bp at goal continue same.      Refill meds including glucagon.  Also discussed otc glucose gel.     Has some edema - will use lasix.      Orders Placed This Encounter   ??? TBOC - Venipuncture w/Collection & Handling   ??? Albumin/Creat Ratio Ur   ??? CBC & Auto Differential   ??? Folate,Serum   ??? Hgb A1c   ??? Lipid Panel   ??? Magnesium ??? PSA,Screening   ??? PTH, Intact   ??? Testosterone   ??? TSH with reflex FT4, FT3   ??? Urinalysis w/Reflex to Culture   ??? Vitamin B12   ??? Vitamin D,25-Hydroxy   ??? Comprehensive Metabolic Panel   ??? Uric Acid   ??? UA,Dipstick   ??? UA,Microscopic   ??? CBC   ??? Differential, Automated   ??? glucagon (BAQSIMI TWO PACK) 3 mg/dose nasal powder   ??? furosemide 20 mg tablet       Wt Readings from Last 5 Encounters:   07/14/18 157 lb (71.2 kg)   03/03/18 161 lb (73 kg)   03/01/18 150 lb (68 kg)   02/22/18 161 lb (73 kg)   01/31/18 156 lb (70.8 kg)     BP Readings from Last 5 Encounters:   07/14/18 129/51   03/01/18 108/61   02/22/18 116/56   01/31/18 107/63   01/27/18 130/74       Hgb A1c - HPLC   Date Value Ref Range Status   07/14/2018 7.5 (H) <5.7 % Final     Comment:     For patients with diabetes, an A1c less than (<) or equal (=) to 7.0% is recommended for most patients, however the goal may be higher or lower depending on age and/or other medical problems.   For a diagnosis of diabetes, A1c greater than (>) or equal(=) to 6.5% indicates diabetes; values between 5.7% and 6.4% may indicate an increased risk of developing diabetes.   01/27/2018 9.4 (H) <5.7 % Final     Comment:     For patients with diabetes, an A1c less than (<) or equal (=) to 7.0% is recommended for most patients, however the goal may be higher or lower depending on age and/or other medical problems.   For a diagnosis of diabetes, A1c greater than (>) or equal(=) to 6.5% indicates diabetes; values between 5.7% and 6.4% may indicate an increased risk of developing diabetes.   12/20/2017 9.6 (H) <5.7 % Final     Comment:     For patients with diabetes, an A1c less than (<) or equal (=) to 7.0% is recommended for most patients, however the goal may be higher or lower depending on age and/or other medical problems.   For a diagnosis of diabetes, A1c greater than (>) or equal(=) to 6.5% indicates diabetes; values between 5.7% and 6.4% may indicate an increased risk of developing diabetes.     Cholesterol   Date Value Ref Range Status   07/14/2018 165 See Comment mg/dL Final     Comment:     The significance of total cholesterol depends on the values of LDL, HDL, triglycerides and the clinical context. A patient-provider discussion may be considered.       01/27/2018 185 See Comment mg/dL Final     Comment:     The significance of total cholesterol depends on the values of LDL, HDL, triglycerides and the  clinical context. A patient-provider discussion may be considered.       09/03/2017 182 See Comment mg/dL Final     Comment:       The significance of total cholesterol depends on the values of LDL, HDL, triglycerides and the clinical context. A patient-provider discussion may be considered.         Cholesterol,LDL,Calc   Date Value Ref Range Status   07/14/2018 47 <100 mg/dL Final     Comment:     If LDL value falls outside of the designated range AND if  included in any of the following categories, a  patient-provider discussion is recommended.     Statin therapy is recommended for individuals:  1. with clinical atherosclerotic cardiovascular disease     irrespective of LDL levels;  2. with LDL > or = 190 mg/dL;  3. with diabetes, aged 40-75 years, with LDL between 70 and     189 mg/dL;  4. without any of the above but who have LDL between 70 and     189 mg/dL and an estimated 84-ONGE risk of     atherosclerotic cardiovascular disease > or = 7.5%     (consider statin therapy if estimated 10-year risk > or =     5.0%) (ACC/AHA 2013 Guidelines).   05/29/2016 74 <100 mg/dL Final     Comment:     If LDL value falls outside of the designated range AND if  included in any of the following categories, a  patient-provider discussion is recommended.     Statin therapy is recommended for individuals:  1. with clinical atherosclerotic cardiovascular disease     irrespective of LDL levels;  2. with LDL > or = 190 mg/dL;  3. with diabetes, aged 40-75 years, with LDL between 70 and 189 mg/dL;  4. without any of the above but who have LDL between 70 and     189 mg/dL and an estimated 95-MWUX risk of     atherosclerotic cardiovascular disease > or = 7.5%     (consider statin therapy if estimated 10-year risk > or =     5.0%) (ACC/AHA 2013 Guidelines).   10/23/2015 61 <100 mg/dL Final     Comment:     If LDL value falls outside of the designated range AND if  included in any of the following categories, a  patient-provider discussion is recommended.     Statin therapy is recommended for individuals:  1. with clinical atherosclerotic cardiovascular disease     irrespective of LDL levels;  2. with LDL > or = 190 mg/dL;  3. with diabetes, aged 40-75 years, with LDL between 70 and     189 mg/dL;  4. without any of the above but who have LDL between 70 and     189 mg/dL and an estimated 32-GMWN risk of     atherosclerotic cardiovascular disease > or = 7.5%     (consider statin therapy if estimated 10-year risk > or =     5.0%) (ACC/AHA 2013 Guidelines).     Cholesterol, HDL   Date Value Ref Range Status   07/14/2018 104 >40 mg/dL Final     Comment:     If HDL cholesterol level falls outside of the designated  range, a patient-provider discussion is recommended   01/27/2018 99 >40 mg/dL Final     Comment:     If HDL cholesterol level falls outside of the designated  range, a patient-provider discussion is recommended  09/03/2017 94 >40 mg/dL Final     Comment:     If HDL cholesterol level falls outside of the designated  range, a patient-provider discussion is recommended     Triglycerides   Date Value Ref Range Status   07/14/2018 70 <150 mg/dL Final     Comment:     If Triglyceride level falls outside of the designated range,  a patient-provider discussion is recommended.     01/27/2018 87 <150 mg/dL Final     Comment:     If Triglyceride level falls outside of the designated range,  a patient-provider discussion is recommended.     09/03/2017 81 <150 mg/dL Final     Comment: Patient is non-fasting, interpret with caution.    If Triglyceride level falls outside of the designated range,  a patient-provider discussion is recommended.        Creatinine   Date Value Ref Range Status   07/14/2018 0.85 0.60 - 1.30 mg/dL Final   60/45/4098 1.19 0.60 - 1.30 mg/dL Final   14/78/2956 2.13 0.60 - 1.30 mg/dL Final        PHQ-9 Results  Depression Screening (Patient Health Questionnaire PHQ) 08/18/2012 10/19/2014 10/19/2014 09/11/2015 12/24/2016 01/22/2017 01/27/2018   PHQ-2: Feeling down, depressed, or hopeless - No No No No No No   PHQ-2: Little interest or pleassure in doing things - No No No No No No   Little interest or pleasure in doing things Not at all - - - - - -   Feeling down, depressed, or hopeless Not at all - - - - - -   Total Score 0 - - - - - -   Some recent data might be hidden       GAD-7 Results  No flowsheet data found.    DAST Results  No flowsheet data found.    Audit-C results  No flowsheet data found.      Return in about 3 weeks (around 08/04/2018), or if symptoms worsen or fail to improve.  The above plan of care, diagnosis, orders, and follow-up were discussed with the patient.  Questions related to this recommended plan of care were answered.  Lonzo Cloud. Karolee Ohs, DO  07/17/2018    Author:  Lonzo Cloud. Korynn Kenedy 07/17/2018 4:05 PM

## 2018-07-19 ENCOUNTER — Telehealth: Payer: BLUE CROSS/BLUE SHIELD

## 2018-07-19 NOTE — Telephone Encounter
S/w pt and relayed msg. Pt voiced understanding.

## 2018-07-19 NOTE — Telephone Encounter
-----   Message from Ewing. Gregor, DO sent at 07/18/2018  5:47 PM PDT -----  1) hemoglobin a1c shows better control (though still not optimal) and also after the discussion we had at your visit, I think this is misleading.  Continue to follow the instructions I gave you and see you at the end of next week  2) rest of labs look good.

## 2018-07-19 NOTE — Telephone Encounter
Called pt, per caretaker pt is asleep. States he will be avail at 10 o'clock. Will try then.

## 2018-07-21 ENCOUNTER — Telehealth: Payer: BLUE CROSS/BLUE SHIELD

## 2018-07-21 DIAGNOSIS — R6889 Other general symptoms and signs: Secondary | ICD-10-CM

## 2018-07-21 DIAGNOSIS — Z20828 Contact with and (suspected) exposure to other viral communicable diseases: Secondary | ICD-10-CM

## 2018-07-22 NOTE — Progress Notes
PATIENT: Chad Watkins  MRN: [id number]  DOB: 08/22/1945  DATE OF SERVICE: 07/21/2018    CHIEF COMPLAINT:   Chief Complaint   Patient presents with   ? ILI/PUI   1) EXPOSURE TO COVID - SON WHO LIVES WITH THEM TESTED + YESTERDAY FOR COVID.  NO SX'S IN PATIENT  2) DM - WITH CHANGES FROM LAST VISIT TO INSULIN REGIMEN DUE TO HYPOGLYCEMIA, SUGARS INCREASED UP TO 400'S    Past Medical History:   Diagnosis Date   ? Diabetic neuropathy (HCC/RAF) 01/27/2018   ? Diabetic peripheral vascular disease (HCC/RAF) 09/03/2017   ? DM type 1 with diabetic mixed hyperlipidemia (HCC/RAF) 09/03/2017   ? Edema leg 07/14/2018   ? History of fracture of left ankle 12/01/2017   ? Hypertension associated with diabetes (HCC/RAF) 09/03/2017   ? Peripheral vascular disease (HCC/RAF) 09/03/2017   ? Trigger thumb 01/27/2018   ? Type 1 diabetes mellitus with diabetic neuropathy, unspecified (HCC/RAF) 09/03/2017       No past surgical history on file.    Social History     Tobacco Use   Smoking Status Never Smoker   Smokeless Tobacco Never Used       No family history on file.    Social History     Substance and Sexual Activity   Alcohol Use Yes   ? Alcohol/week: 4.2 oz   ? Types: 7 Standard drinks or equivalent per week       Social History     Substance and Sexual Activity   Drug Use Not on file       No Known Allergies    Patient Active Problem List   Diagnosis   ? Bursitis   ? Skin cancer   ? Hyperlipidemia   ? Palpitation   ? Need for vaccination   ? Healthcare maintenance   ? Hypertension, well controlled   ? Abnormal EKG   ? Left ankle pain   ? Hypertension associated with diabetes (HCC/RAF)   ? DM type 1 with diabetic mixed hyperlipidemia (HCC/RAF)   ? DM (diabetes mellitus), type 1, uncontrolled, periph vascular complic (HCC/RAF)   ? Peripheral vascular disease (HCC/RAF)   ? DM (diabetes mellitus), type 1, uncontrolled w/neurologic complication (HCC/RAF)   ? History of fracture of left ankle   ? Diabetic neuropathy (HCC/RAF)   ? Trigger thumb ? Edema leg         Current Outpatient Medications:   ?  acetaminophen-codeine (TYLENOL #3) 300-30 mg tablet, TAKE 1 TABLET EVERY 4 HOURS AS NEEDED FOR PAIN, Disp: , Rfl: 0  ?  amLODIPine-benazepril 5-20 mg capsule, Take 1 capsule by mouth two (2) times daily., Disp: 180 capsule, Rfl: 1  ?  BD INSULIN SYRINGE U/F 31G X 5/16'' 0.3 ML syringe, INJECT 1 EACH UNDER THE SKIN FIVE (5) TIMES DAILY., Disp: 100 each, Rfl: 1  ?  ciclopirox 8% solution, Apply topically at bedtime Apply over nail and surrounding skin.., Disp: 1 bottle, Rfl: 3  ?  FLUZONE HIGH-DOSE 0.5 ML syringe, , Disp: , Rfl:   ?  furosemide 20 mg tablet, Take 1 tablet (20 mg total) by mouth every morning., Disp: 90 tablet, Rfl: 0  ?  gatifloxacin 0.5% ophthalmic solution, , Disp: , Rfl:   ?  glucagon (BAQSIMI TWO PACK) 3 mg/dose nasal powder, 1 actuation (3 mg total) by Left Nare route every fifteen (15) minutes as needed., Disp: 3 each, Rfl: 1  ?  glucose blood test strip, Use as instructed,  Disp: 100 each, Rfl: 12  ?  HUMALOG 100 unit/mL injection vial, USE 6 UNITS SUBCUTANEOUSLY 3 TIMES DAILY BEFORE MEALS., Disp: 30 mL, Rfl: 1  ?  LANTUS 100 unit/mL injection vial, INJECT 0.2 MLS (20 UNITS TOTAL) UNDER THE SKIN AT BEDTIME.. (Patient taking differently: Inject under the skin at bedtime 13 units.), Disp: 20 mL, Rfl: 1  ?  LANTUS 100 unit/mL injection vial, INJECT 20 UNITS TOTAL UNDER THE SKIN AT BEDTIME.., Disp: 20 mL, Rfl: 1  ?  LOTEMAX SM 0.38 % GEL, , Disp: , Rfl:   ?  metFORMIN (GLUCOPHAGE XR) 500 mg PO ER 24 hr tablet, TAKE 2 TABLETS (1,000 MG TOTAL) BY MOUTH TWO (2) TIMES DAILY WITH MEALS., Disp: 360 tablet, Rfl: 0  ?  ONETOUCH ULTRA BLUE test strip, USE 1 TEST STRIP TESTING 5 TIMES A DAY., Disp: 500 strip, Rfl: 3  ?  PROLENSA 0.07 % SOLN, , Disp: , Rfl:   ?  SHINGRIX 50 MCG/0.5ML injection, , Disp: , Rfl:   ?  sildenafil 100 mg tablet, Take 1 tablet (100 mg total) by mouth daily as needed for Erectile Dysfunction., Disp: 10 tablet, Rfl: 10 ???  simvastatin (ZOCOR) 20 mg tablet, TOME UNA TABLETA DIARIAMENTE, Disp: 90 tablet, Rfl: 3  ???  SIMVASTATIN 20 mg tablet, TOME UNA TABLETA TODOS LOS DIAS AL ACOSTARSE, Disp: 90 tablet, Rfl: 3    Health Maintenance   Topic Date Due   ??? Advance Directive  05/19/2009   ??? Influenza Vaccine (1) 09/20/2018   ??? Diabetes: EYE EXAM  11/03/2018   ??? Diabetes: HGB A1C  01/13/2019   ??? Annual Preventive Wellness Visit  01/28/2019   ??? Diabetes: FOOT EXAM  06/30/2019   ??? Diabetes: NEPHROPATHY MONITORING  07/14/2019   ??? Tdap/Td Vaccine (2 - Td) 07/19/2022   ??? Colon Ca Screening: COLONOSCOPY  03/02/2023   ??? Pneumococcal Vaccine  Completed   ??? Hepatitis C Screening  Completed   ??? Shingles (Shingrix) Vaccine  Completed   ??? Statin prescribed for ASCVD Prevention or Treatment  Completed       Patient Care Team:  Trena Platt., DO as PCP - General (Family Medicine)      Subjective:      Chad Watkins is a 73 y.o. male.    Review of Systems   Constitutional: Negative.  Negative for activity change, appetite change, chills, diaphoresis, fatigue, fever and unexpected weight change.   HENT: Negative.  Negative for congestion, dental problem, drooling, ear discharge, ear pain, facial swelling, hearing loss, mouth sores, nosebleeds, postnasal drip, rhinorrhea, sinus pressure, sinus pain, sneezing, sore throat, tinnitus, trouble swallowing and voice change.    Eyes: Negative.  Negative for photophobia, pain, discharge, redness, itching and visual disturbance.   Respiratory: Negative.  Negative for apnea, cough, choking, chest tightness, shortness of breath, wheezing and stridor.    Cardiovascular: Negative.  Negative for chest pain, palpitations and leg swelling.   Gastrointestinal: Negative.  Negative for abdominal distention, abdominal pain, anal bleeding, blood in stool, constipation, diarrhea, nausea, rectal pain and vomiting.   Endocrine: Negative.  Negative for cold intolerance, heat intolerance, polydipsia, polyphagia and polyuria.   Genitourinary: Negative.  Negative for decreased urine volume, difficulty urinating, discharge, dysuria, enuresis, flank pain, frequency, genital sores, hematuria, penile pain, penile swelling, scrotal swelling, testicular pain and urgency.   Musculoskeletal: Negative.  Negative for arthralgias, back pain, gait problem, joint swelling, myalgias, neck pain and neck stiffness.   Skin: Negative.  Negative  for color change, pallor, rash and wound.   Allergic/Immunologic: Negative.  Negative for environmental allergies, food allergies and immunocompromised state.   Neurological: Negative.  Negative for dizziness, tremors, seizures, syncope, facial asymmetry, speech difficulty, weakness, light-headedness, numbness and headaches.   Hematological: Negative.  Negative for adenopathy. Does not bruise/bleed easily.   Psychiatric/Behavioral: Negative.  Negative for agitation, behavioral problems, confusion, decreased concentration, dysphoric mood, hallucinations, self-injury, sleep disturbance and suicidal ideas. The patient is not nervous/anxious and is not hyperactive.    All other systems reviewed and are negative.        Objective:      Physical Exam    TELEPHONE VISIT      ASSESSMENT AND PLAN     1. Hypertension associated with diabetes (HCC/RAF)    2. Exposure to Covid-19 Virus    3. Suspected Covid-19 Virus Infection      1) DM - ADD BACK 5 UNITS OF LANTUS QAM, CONTINUE SAME NOVOLOG SSI AND METFORMIN 1000 MG BID  2) DUE TO HIGH RISK AND A DIRECT CLOSE EXPOSURE WILL ORDER TEST.    Orders Placed This Encounter   ??? COVID-19 PCR, Nasopharyngeal       Wt Readings from Last 5 Encounters:   07/14/18 157 lb (71.2 kg)   03/03/18 161 lb (73 kg)   03/01/18 150 lb (68 kg)   02/22/18 161 lb (73 kg)   01/31/18 156 lb (70.8 kg)     BP Readings from Last 5 Encounters:   07/14/18 129/51   03/01/18 108/61   02/22/18 116/56   01/31/18 107/63   01/27/18 130/74       Hgb A1c - HPLC Date Value Ref Range Status   07/14/2018 7.5 (H) <5.7 % Final     Comment:     For patients with diabetes, an A1c less than (<) or equal (=) to 7.0% is recommended for most patients, however the goal may be higher or lower depending on age and/or other medical problems.   For a diagnosis of diabetes, A1c greater than (>) or equal(=) to 6.5% indicates diabetes; values between 5.7% and 6.4% may indicate an increased risk of developing diabetes.   01/27/2018 9.4 (H) <5.7 % Final     Comment:     For patients with diabetes, an A1c less than (<) or equal (=) to 7.0% is recommended for most patients, however the goal may be higher or lower depending on age and/or other medical problems.   For a diagnosis of diabetes, A1c greater than (>) or equal(=) to 6.5% indicates diabetes; values between 5.7% and 6.4% may indicate an increased risk of developing diabetes.   12/20/2017 9.6 (H) <5.7 % Final     Comment:     For patients with diabetes, an A1c less than (<) or equal (=) to 7.0% is recommended for most patients, however the goal may be higher or lower depending on age and/or other medical problems.   For a diagnosis of diabetes, A1c greater than (>) or equal(=) to 6.5% indicates diabetes; values between 5.7% and 6.4% may indicate an increased risk of developing diabetes.     Cholesterol   Date Value Ref Range Status   07/14/2018 165 See Comment mg/dL Final     Comment:     The significance of total cholesterol depends on the values of LDL, HDL, triglycerides and the clinical context. A patient-provider discussion may be considered.       01/27/2018 185 See Comment mg/dL Final  Comment:     The significance of total cholesterol depends on the values of LDL, HDL, triglycerides and the clinical context. A patient-provider discussion may be considered.       09/03/2017 182 See Comment mg/dL Final     Comment:       The significance of total cholesterol depends on the values of LDL, HDL, triglycerides and the clinical context. A patient-provider discussion may be considered.         Cholesterol,LDL,Calc   Date Value Ref Range Status   07/14/2018 47 <100 mg/dL Final     Comment:     If LDL value falls outside of the designated range AND if  included in any of the following categories, a  patient-provider discussion is recommended.     Statin therapy is recommended for individuals:  1. with clinical atherosclerotic cardiovascular disease     irrespective of LDL levels;  2. with LDL > or = 190 mg/dL;  3. with diabetes, aged 40-75 years, with LDL between 70 and     189 mg/dL;  4. without any of the above but who have LDL between 70 and     189 mg/dL and an estimated 16-XWRU risk of     atherosclerotic cardiovascular disease > or = 7.5%     (consider statin therapy if estimated 10-year risk > or =     5.0%) (ACC/AHA 2013 Guidelines).   05/29/2016 74 <100 mg/dL Final     Comment:     If LDL value falls outside of the designated range AND if  included in any of the following categories, a  patient-provider discussion is recommended.     Statin therapy is recommended for individuals:  1. with clinical atherosclerotic cardiovascular disease     irrespective of LDL levels;  2. with LDL > or = 190 mg/dL;  3. with diabetes, aged 40-75 years, with LDL between 70 and     189 mg/dL;  4. without any of the above but who have LDL between 70 and     189 mg/dL and an estimated 04-VWUJ risk of     atherosclerotic cardiovascular disease > or = 7.5%     (consider statin therapy if estimated 10-year risk > or =     5.0%) (ACC/AHA 2013 Guidelines).   10/23/2015 61 <100 mg/dL Final     Comment:     If LDL value falls outside of the designated range AND if  included in any of the following categories, a  patient-provider discussion is recommended.     Statin therapy is recommended for individuals:  1. with clinical atherosclerotic cardiovascular disease     irrespective of LDL levels;  2. with LDL > or = 190 mg/dL; 3. with diabetes, aged 40-75 years, with LDL between 70 and     189 mg/dL;  4. without any of the above but who have LDL between 70 and     189 mg/dL and an estimated 81-XBJY risk of     atherosclerotic cardiovascular disease > or = 7.5%     (consider statin therapy if estimated 10-year risk > or =     5.0%) (ACC/AHA 2013 Guidelines).     Cholesterol, HDL   Date Value Ref Range Status   07/14/2018 104 >40 mg/dL Final     Comment:     If HDL cholesterol level falls outside of the designated  range, a patient-provider discussion is recommended   01/27/2018 99 >40 mg/dL Final  Comment:     If HDL cholesterol level falls outside of the designated  range, a patient-provider discussion is recommended   09/03/2017 94 >40 mg/dL Final     Comment:     If HDL cholesterol level falls outside of the designated  range, a patient-provider discussion is recommended     Triglycerides   Date Value Ref Range Status   07/14/2018 70 <150 mg/dL Final     Comment:     If Triglyceride level falls outside of the designated range,  a patient-provider discussion is recommended.     01/27/2018 87 <150 mg/dL Final     Comment:     If Triglyceride level falls outside of the designated range,  a patient-provider discussion is recommended.     09/03/2017 81 <150 mg/dL Final     Comment:     Patient is non-fasting, interpret with caution.    If Triglyceride level falls outside of the designated range,  a patient-provider discussion is recommended.        Creatinine   Date Value Ref Range Status   07/14/2018 0.85 0.60 - 1.30 mg/dL Final   16/10/9602 5.40 0.60 - 1.30 mg/dL Final   98/11/9145 8.29 0.60 - 1.30 mg/dL Final        PHQ-9 Results  Depression Screening (Patient Health Questionnaire PHQ) 08/18/2012 10/19/2014 10/19/2014 09/11/2015 12/24/2016 01/22/2017 01/27/2018   PHQ-2: Feeling down, depressed, or hopeless - No No No No No No   PHQ-2: Little interest or pleassure in doing things - No No No No No No Little interest or pleasure in doing things Not at all - - - - - -   Feeling down, depressed, or hopeless Not at all - - - - - -   Total Score 0 - - - - - -   Some recent data might be hidden       GAD-7 Results  No flowsheet data found.    DAST Results  No flowsheet data found.    Audit-C results  No flowsheet data found.      No follow-ups on file.  The above plan of care, diagnosis, orders, and follow-up were discussed with the patient.  Questions related to this recommended plan of care were answered.  Lonzo Cloud. Karolee Ohs, DO  07/21/2018      Author:  Lonzo Cloud. Jarell Mcewen 07/21/2018 5:09 PM

## 2018-07-26 ENCOUNTER — Institutional Professional Consult (permissible substitution): Payer: BLUE CROSS/BLUE SHIELD

## 2018-07-26 DIAGNOSIS — R6889 Other general symptoms and signs: Secondary | ICD-10-CM

## 2018-07-27 LAB — COVID-19 PCR

## 2018-08-04 ENCOUNTER — Ambulatory Visit: Payer: BLUE CROSS/BLUE SHIELD

## 2018-08-11 ENCOUNTER — Ambulatory Visit: Payer: BLUE CROSS/BLUE SHIELD

## 2018-08-11 DIAGNOSIS — G8929 Other chronic pain: Secondary | ICD-10-CM

## 2018-08-11 DIAGNOSIS — M25572 Pain in left ankle and joints of left foot: Secondary | ICD-10-CM

## 2018-08-11 DIAGNOSIS — S82892A Other fracture of left lower leg, initial encounter for closed fracture: Secondary | ICD-10-CM

## 2018-08-11 DIAGNOSIS — S82892K Other fracture of left lower leg, subsequent encounter for closed fracture with nonunion: Secondary | ICD-10-CM

## 2018-08-11 NOTE — Progress Notes
Subjective    Chad Watkins is here to follow up ankle fracture.  He's been using the bone stimulator as directed and reports decreased pain around the ankle.      Past Medical History:   Diagnosis Date   ??? Diabetic neuropathy (HCC/RAF) 01/27/2018   ??? Diabetic peripheral vascular disease (HCC/RAF) 09/03/2017   ??? DM type 1 with diabetic mixed hyperlipidemia (HCC/RAF) 09/03/2017   ??? Edema leg 07/14/2018   ??? History of fracture of left ankle 12/01/2017   ??? Hypertension associated with diabetes (HCC/RAF) 09/03/2017   ??? Peripheral vascular disease (HCC/RAF) 09/03/2017   ??? Trigger thumb 01/27/2018   ??? Type 1 diabetes mellitus with diabetic neuropathy, unspecified (HCC/RAF) 09/03/2017       Outpatient Medications Prior to Visit   Medication Sig   ??? acetaminophen-codeine (TYLENOL #3) 300-30 mg tablet TAKE 1 TABLET EVERY 4 HOURS AS NEEDED FOR PAIN   ??? amLODIPine-benazepril 5-20 mg capsule Take 1 capsule by mouth two (2) times daily.   ??? BD INSULIN SYRINGE U/F 31G X 5/16'' 0.3 ML syringe INJECT 1 EACH UNDER THE SKIN FIVE (5) TIMES DAILY.   ??? ciclopirox 8% solution Apply topically at bedtime Apply over nail and surrounding skin.Marland Kitchen   ??? FLUZONE HIGH-DOSE 0.5 ML syringe    ??? furosemide 20 mg tablet Take 1 tablet (20 mg total) by mouth every morning.   ??? gatifloxacin 0.5% ophthalmic solution    ??? glucagon (BAQSIMI TWO PACK) 3 mg/dose nasal powder 1 actuation (3 mg total) by Left Nare route every fifteen (15) minutes as needed.   ??? glucose blood test strip Use as instructed   ??? HUMALOG 100 unit/mL injection vial USE 6 UNITS SUBCUTANEOUSLY 3 TIMES DAILY BEFORE MEALS.   ??? LANTUS 100 unit/mL injection vial INJECT 0.2 MLS (20 UNITS TOTAL) UNDER THE SKIN AT BEDTIME.. (Patient taking differently: Inject under the skin at bedtime 13 units.)   ??? LANTUS 100 unit/mL injection vial INJECT 20 UNITS TOTAL UNDER THE SKIN AT BEDTIME.Marland Kitchen   ??? LOTEMAX SM 0.38 % GEL    ??? metFORMIN (GLUCOPHAGE XR) 500 mg PO ER 24 hr tablet TAKE 2 TABLETS (1,000 MG TOTAL) BY MOUTH TWO (2) TIMES DAILY WITH MEALS.   ??? ONETOUCH ULTRA BLUE test strip USE 1 TEST STRIP TESTING 5 TIMES A DAY.   ??? PROLENSA 0.07 % SOLN    ??? SHINGRIX 50 MCG/0.5ML injection    ??? sildenafil 100 mg tablet Take 1 tablet (100 mg total) by mouth daily as needed for Erectile Dysfunction.   ??? simvastatin (ZOCOR) 20 mg tablet TOME UNA TABLETA DIARIAMENTE   ??? SIMVASTATIN 20 mg tablet TOME UNA TABLETA TODOS LOS DIAS AL ACOSTARSE     No facility-administered medications prior to visit.      PHYSICAL EXAM:  GEN: no acute distress alert/oriented x3 appropriate mood and affect looks stated age well nourished  VASC:  Dorsalis Pedis:  present  Posterior Tibial:  present  There is brisk capillary refill time to all digits less than 3 seconds.     DERM:   edema  moderate Left , ecchymosis is not observed, Erythema is none.  NEURO:  protective sensation is unchanged from previous visit.  MSK:  Muscle strength 5/5 in all 4 quadrants.    Ankle ROM is normal, Subtalar joint ROM is normal.  There is no tenderness to palpation of the L ankle at the fibula.    IMAGING:  The fibular fracture appears to be healed.  Assessment /Plan   Chad Watkins is a 73 y.o. year old male who presents with:  1. 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 Chronic pain of left ankle  XR ankle ap+lat+mortise left (3 views)   4.      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  F/u prn.      [name], DPM  08/11/2018

## 2018-08-15 MED ORDER — METFORMIN HCL ER 500 MG PO TB24
1000 mg | ORAL_TABLET | Freq: Two times a day (BID) | ORAL | 0 refills
Start: 2018-08-15 — End: ?

## 2018-08-18 ENCOUNTER — Ambulatory Visit: Payer: BLUE CROSS/BLUE SHIELD

## 2018-08-18 DIAGNOSIS — E1042 Type 1 diabetes mellitus with diabetic polyneuropathy: Secondary | ICD-10-CM

## 2018-08-18 MED ORDER — METFORMIN HCL ER 500 MG PO TB24
1000 mg | ORAL_TABLET | Freq: Two times a day (BID) | ORAL | 3 refills | Status: AC
Start: 2018-08-18 — End: ?

## 2018-08-19 MED ORDER — METFORMIN HCL ER 500 MG PO TB24
1000 mg | ORAL_TABLET | Freq: Two times a day (BID) | ORAL | 0 refills
Start: 2018-08-19 — End: ?

## 2018-09-17 MED ORDER — HUMALOG 100 UNIT/ML SC SOLN
1 refills
Start: 2018-09-17 — End: ?

## 2018-09-19 ENCOUNTER — Ambulatory Visit: Payer: BLUE CROSS/BLUE SHIELD

## 2018-09-19 DIAGNOSIS — E1042 Type 1 diabetes mellitus with diabetic polyneuropathy: Secondary | ICD-10-CM

## 2018-09-19 DIAGNOSIS — Z0000 Encounter for general adult medical examination without abnormal findings: Secondary | ICD-10-CM

## 2018-09-19 DIAGNOSIS — E1065 Type 1 diabetes mellitus with hyperglycemia: Secondary | ICD-10-CM

## 2018-09-19 MED ORDER — INSULIN GLARGINE 100 UNIT/ML SC SOLN
1 refills | 32.00000 days | Status: AC
Start: 2018-09-19 — End: ?

## 2018-09-19 MED ORDER — INSULIN SYRINGE-NEEDLE U-100 31G X 5/16" 0.3 ML MISC
6 refills | Status: AC
Start: 2018-09-19 — End: ?

## 2018-09-19 MED ORDER — INSULIN LISPRO 100 UNIT/ML SC SOLN
1 refills | 30.00000 days | Status: AC
Start: 2018-09-19 — End: ?

## 2018-09-19 NOTE — Progress Notes
PATIENT: Chad Watkins  MRN: [id number]  DOB: 1945-05-20  DATE OF SERVICE: 09/19/2018    CHIEF COMPLAINT:   Chief Complaint   Patient presents with   ??? Follow-up   ??? Diabetes     Diabetes   He presents for his follow-up diabetic visit. He has type 1 diabetes mellitus. His disease course has been fluctuating. Hypoglycemia symptoms include confusion, dizziness, mood changes, nervousness/anxiousness, pallor, sleepiness, speech difficulty, sweats and tremors. Pertinent negatives for hypoglycemia include no headaches, hunger or seizures. Associated symptoms include fatigue and foot paresthesias. Pertinent negatives for diabetes include no blurred vision, no chest pain, no foot ulcerations, no polydipsia, no polyphagia, no polyuria, no visual change, no weakness and no weight loss. Hypoglycemia complications include blackouts, hospitalization, nocturnal hypoglycemia, required assistance and required glucagon injection. Diabetic symptom progression: FLUCTUATES. Diabetic complications include peripheral neuropathy and PVD. Pertinent negatives for diabetic complications include no autonomic neuropathy, CVA, heart disease, impotence, nephropathy or retinopathy. Risk factors for coronary artery disease include diabetes mellitus, male sex and stress. Current diabetic treatment includes insulin injections and oral agent (monotherapy) (POOR DIET, BOTH LONG ACTING AND SHORT ACTING INSULIN, PLUS METFORMIN). He is compliant with treatment most of the time (SOMETIMES DOES OWN THING WITH REGARD TO HUMALOG SPOTTING). He is currently taking insulin pre-breakfast, pre-lunch, pre-dinner and at bedtime (ACCUCHEK'S AT LEAST QAC/HS AND SOMETIMES 3 AM.  BUT SCHEDULE IS SHIFTED DUE TO WORKING LATE MORNING/NOON TO LATE NIGHT 10'ISH). Insulin injections are given by patient and relative (DAUGHTERS ACTIVE IN HIS CARE). His weight is stable. He is following a generally unhealthy diet. When asked about meal planning, he reported none (GENERALLY DOES KNOW HOW TO CARB COUNT BUT DOES NOT DO IT). He has not had a previous visit with a dietitian (NOT IN MANY YEARS.  VERY RESISTANT TO CHANGING DIET AND LIFESTYLE). He participates in exercise intermittently (VERY ACTIVE AT WORK). He monitors blood glucose at home 5+ x per day. Blood glucose monitoring compliance is good. His home blood glucose trend is fluctuating dramatically (SOME LOWS IN 30'S TO 50'S, SOME HIGHS IN 300'S). (ALL TIMES OF DAY CAN FLUCTUATE, VERY LITTLE CONSISTENCY DUE TO EATING PATTERNS, SOMETIMES NOT EATING, OTHER TIMES EATING A LOT) An ACE inhibitor/angiotensin II receptor blocker is being taken. He does not see a podiatrist.Eye exam is not current.         Past Medical History:   Diagnosis Date   ??? Diabetic neuropathy (HCC/RAF) 01/27/2018   ??? Diabetic peripheral vascular disease (HCC/RAF) 09/03/2017   ??? DM type 1 with diabetic mixed hyperlipidemia (HCC/RAF) 09/03/2017   ??? Edema leg 07/14/2018   ??? History of fracture of left ankle 12/01/2017   ??? Hypertension associated with diabetes (HCC/RAF) 09/03/2017   ??? Peripheral vascular disease (HCC/RAF) 09/03/2017   ??? Trigger thumb 01/27/2018   ??? Type 1 diabetes mellitus with diabetic neuropathy, unspecified (HCC/RAF) 09/03/2017       No past surgical history on file.    Social History     Tobacco Use   Smoking Status Never Smoker   Smokeless Tobacco Never Used       No family history on file.    Social History     Substance and Sexual Activity   Alcohol Use Yes   ??? Alcohol/week: 4.2 oz   ??? Types: 7 Standard drinks or equivalent per week       Social History     Substance and Sexual Activity   Drug Use Not on file  No Known Allergies    Patient Active Problem List   Diagnosis   ??? Bursitis   ??? Skin cancer   ??? Hyperlipidemia   ??? Palpitation   ??? Need for vaccination   ??? Healthcare maintenance   ??? Hypertension, well controlled   ??? Abnormal EKG   ??? Left ankle pain   ??? Hypertension associated with diabetes (HCC/RAF) ??? DM type 1 with diabetic mixed hyperlipidemia (HCC/RAF)   ??? DM (diabetes mellitus), type 1, uncontrolled, periph vascular complic (HCC/RAF)   ??? Peripheral vascular disease (HCC/RAF)   ??? DM (diabetes mellitus), type 1, uncontrolled w/neurologic complication (HCC/RAF)   ??? History of fracture of left ankle   ??? Diabetic neuropathy (HCC/RAF)   ??? Trigger thumb   ??? Edema leg         Current Outpatient Medications:   ???  acetaminophen-codeine (TYLENOL #3) 300-30 mg tablet, TAKE 1 TABLET EVERY 4 HOURS AS NEEDED FOR PAIN, Disp: , Rfl: 0  ???  amLODIPine-benazepril 5-20 mg capsule, Take 1 capsule by mouth two (2) times daily., Disp: 180 capsule, Rfl: 1  ???  ciclopirox 8% solution, Apply topically at bedtime Apply over nail and surrounding skin.., Disp: 1 bottle, Rfl: 3  ???  FLUZONE HIGH-DOSE 0.5 ML syringe, , Disp: , Rfl:   ???  furosemide 20 mg tablet, Take 1 tablet (20 mg total) by mouth every morning., Disp: 90 tablet, Rfl: 0  ???  gatifloxacin 0.5% ophthalmic solution, , Disp: , Rfl:   ???  glucagon (BAQSIMI TWO PACK) 3 mg/dose nasal powder, 1 actuation (3 mg total) by Left Nare route every fifteen (15) minutes as needed., Disp: 3 each, Rfl: 1  ???  glucose blood test strip, Use as instructed, Disp: 100 each, Rfl: 12  ???  HUMALOG 100 unit/mL injection vial, USE 6 UNITS SUBCUTANEOUSLY 3 TIMES DAILY BEFORE MEALS., Disp: 30 mL, Rfl: 1  ???  insulin syringe needle U-100 (BD INSULIN SYRINGE U/F) 31G X 5/16'' 0.3 mL syringe, Up to 7 times a day., Disp: 700 each, Rfl: 6  ???  LANTUS 100 unit/mL injection vial, INJECT 0.2 MLS (20 UNITS TOTAL) UNDER THE SKIN AT BEDTIME.. (Patient taking differently: Inject under the skin at bedtime 13 units.), Disp: 20 mL, Rfl: 1  ???  LANTUS 100 unit/mL injection vial, INJECT 20 UNITS TOTAL UNDER THE SKIN AT BEDTIME.., Disp: 20 mL, Rfl: 1  ???  LOTEMAX SM 0.38 % GEL, , Disp: , Rfl:   ???  metFORMIN (GLUCOPHAGE XR) 500 mg PO ER 24 hr tablet, Take 2 tablets (1,000 mg total) by mouth two (2) times daily with meals., Disp: 360 tablet, Rfl: 3  ???  ONETOUCH ULTRA BLUE test strip, USE 1 TEST STRIP TESTING 5 TIMES A DAY., Disp: 500 strip, Rfl: 3  ???  PROLENSA 0.07 % SOLN, , Disp: , Rfl:   ???  SHINGRIX 50 MCG/0.5ML injection, , Disp: , Rfl:   ???  sildenafil 100 mg tablet, Take 1 tablet (100 mg total) by mouth daily as needed for Erectile Dysfunction., Disp: 10 tablet, Rfl: 10  ???  simvastatin (ZOCOR) 20 mg tablet, TOME UNA TABLETA DIARIAMENTE, Disp: 90 tablet, Rfl: 3  ???  SIMVASTATIN 20 mg tablet, TOME UNA TABLETA TODOS LOS DIAS AL ACOSTARSE, Disp: 90 tablet, Rfl: 3    Health Maintenance   Topic Date Due   ??? Advance Directive  05/19/2009   ??? Diabetes: EYE EXAM  11/03/2018   ??? Influenza Vaccine (1) 09/20/2018   ??? Diabetes: HGB  A1C  01/13/2019   ??? Annual Preventive Wellness Visit  01/28/2019   ??? Diabetes: NEPHROPATHY MONITORING  07/14/2019   ??? Tdap/Td Vaccine (2 - Td) 07/19/2022   ??? Colon Ca Screening: COLONOSCOPY  03/02/2023   ??? Pneumococcal Vaccine  Completed   ??? Hepatitis C Screening  Completed   ??? Shingles (Shingrix) Vaccine  Completed   ??? Statin prescribed for ASCVD Prevention or Treatment  Completed       Patient Care Team:  Trena Platt., DO as PCP - General (Family Medicine)      Subjective:      Chad Watkins is a 73 y.o. male.    Review of Systems   Constitutional: Positive for fatigue. Negative for activity change, appetite change, chills, diaphoresis, fever, unexpected weight change and weight loss.   HENT: Negative.  Negative for congestion, dental problem, drooling, ear discharge, ear pain, facial swelling, hearing loss, mouth sores, nosebleeds, postnasal drip, rhinorrhea, sinus pressure, sinus pain, sneezing, sore throat, tinnitus, trouble swallowing and voice change.    Eyes: Negative.  Negative for blurred vision, photophobia, pain, discharge, redness, itching and visual disturbance.   Respiratory: Negative.  Negative for apnea, cough, choking, chest tightness, shortness of breath, wheezing and stridor.    Cardiovascular: Negative.  Negative for chest pain, palpitations and leg swelling.   Gastrointestinal: Negative.  Negative for abdominal distention, abdominal pain, anal bleeding, blood in stool, constipation, diarrhea, nausea, rectal pain and vomiting.   Endocrine: Negative.  Negative for cold intolerance, heat intolerance, polydipsia, polyphagia and polyuria.   Genitourinary: Negative.  Negative for decreased urine volume, difficulty urinating, discharge, dysuria, enuresis, flank pain, frequency, genital sores, hematuria, impotence, penile pain, penile swelling, scrotal swelling, testicular pain and urgency.   Musculoskeletal: Negative.  Negative for arthralgias, back pain, gait problem, joint swelling, myalgias, neck pain and neck stiffness.   Skin: Positive for pallor. Negative for color change, rash and wound.   Allergic/Immunologic: Negative.  Negative for environmental allergies, food allergies and immunocompromised state.   Neurological: Positive for dizziness, tremors and speech difficulty. Negative for seizures, syncope, facial asymmetry, weakness, light-headedness, numbness and headaches.   Hematological: Negative.  Negative for adenopathy. Does not bruise/bleed easily.   Psychiatric/Behavioral: Positive for confusion. Negative for agitation, behavioral problems, decreased concentration, dysphoric mood, hallucinations, self-injury, sleep disturbance and suicidal ideas. The patient is nervous/anxious. The patient is not hyperactive.    All other systems reviewed and are negative.        Objective:      Physical Exam  Vitals signs reviewed.   Constitutional:       General: He is not in acute distress.     Appearance: Normal appearance. He is well-developed and normal weight. He is not ill-appearing, toxic-appearing or diaphoretic.      Comments: BP 119/72  ~ Pulse 71  ~ Temp 36.9 ???C (98.4 ???F) (Tympanic)  ~ Wt 150 lb (68 kg)  ~ BMI 24.96 kg/m??? HENT:      Head: Normocephalic and atraumatic.      Right Ear: External ear normal.      Left Ear: External ear normal.      Nose: Nose normal.   Eyes:      Conjunctiva/sclera: Conjunctivae normal.   Neck:      Musculoskeletal: Normal range of motion and neck supple. No neck rigidity or muscular tenderness.   Pulmonary:      Effort: Pulmonary effort is normal.   Lymphadenopathy:      Cervical: No cervical  adenopathy.   Skin:     General: Skin is warm and dry.   Neurological:      General: No focal deficit present.      Mental Status: He is alert and oriented to person, place, and time.   Psychiatric:         Mood and Affect: Mood normal.         Behavior: Behavior normal.         Thought Content: Thought content normal.         Judgment: Judgment normal.             ASSESSMENT AND PLAN     1. Hypertension associated with diabetes (HCC/RAF)    2. DM type 1 with diabetic mixed hyperlipidemia (HCC/RAF)    3. DM (diabetes mellitus), type 1, uncontrolled w/neurologic complication (HCC/RAF)    4. DM (diabetes mellitus), type 1, uncontrolled, periph vascular complic (HCC/RAF)    5. Diabetic polyneuropathy associated with type 1 diabetes mellitus (HCC/RAF)    6. Peripheral vascular disease (HCC/RAF)    7. Edema leg    8. Healthcare maintenance    9. Uncontrolled type 1 diabetes mellitus without complication (HCC/RAF)    10. Preventative health care      AFTER LAST VISIT, REGIMEN SHOULD HAVE BEEN LANTUS 5 QAM AND 3 QPM, BUT PT WENT TO 5 AND 5.  DIET PATTERN FLUCTUATES TREMENDOUSLY WITH WORK SCHEDULE.  IMPLORED WITH PATIENT TO TRY TO BE CONSISTENT AND TO WATCH CARBS.  THERE IS NO RHYME OR REASON I CAN FOLLOW AT THIS TIME OF WHAT PATTERN TO SUGGEST.  DID PUT A BOTTOM STOP OF NO HUMALOG IF SUGAR CHECK IS BELOW 100 - PT DID A FEW TIMES BELIEVING WOULD EAT AND COVER 3 UNITS HE WOULD GIVE HIMSELF.  BUT TIMES IT DROPPED DOWN.  ALSO SOME 3 AM LOWS.  TRYING TO USE METFORMIN TO LEVEL OUT EATING PATTERN AND/OR INSULIN PATTERN.  FOR A PERIOD, I DID STOP THE LOWS BUT NOW GETTING THEM BACK.  WILL MOVE TO LANTUS 5 QAM AND 3 QPM, WATCH THE HUMALOG DOSING, AND CONTINUE METFORMIN.  METFORMIN XR HAS APPEARED TO CUT APPETITE DOWN.  DID TRY METFORMIN PLUS JUST HUMALOG SPOTTING BUT SUGAR CONSISTENT IN 400'S WITH THAT REGIMEN.  METFORMIN + JUST MORNING LANTUS 5 UNITS + HUMALOG SPOTTING ALSO INADEQUATE CONTROL SO ADDED NIGHTTIME DOSING.  WHEN FIRST MET ME, WAS JUST TAKING HIGH DOSES 15-20 UNITS LANTUS AT NIGHT AND SPOTTED HUMALOG ON HIS OWN MADE UP SCALE.  MAY TAKE AWAY NIGHT TIME LANTUS ALL TOGETHER AND INCREASE MORNING LANTUS WITH METFORMIN AND HUMALOG SPOTTING.  COME BACK IN 2-4 WEEKS FOR RECHECK.  IMPLORED TO SEE NUTRITION AND ENDO.      Orders Placed This Encounter   ??? TBOC - Venipuncture w/Collection & Handling   ??? Albumin/Creat Ratio Ur   ??? CBC & Auto Differential   ??? Folate,Serum   ??? Hgb A1c   ??? Lipid Panel   ??? Magnesium   ??? TSH with reflex FT4, FT3   ??? Urinalysis w/Reflex to Culture   ??? Vitamin B12   ??? Vitamin D,25-Hydroxy   ??? PTH, Intact   ??? Uric Acid   ??? Comprehensive Metabolic Panel   ??? UA,Dipstick   ??? UA,Microscopic   ??? CBC   ??? Differential, Automated   ??? Referral for Authorization for MNT   ??? insulin syringe needle U-100 (BD INSULIN SYRINGE U/F) 31G X 5/16'' 0.3 mL syringe  Wt Readings from Last 5 Encounters:   09/19/18 150 lb (68 kg)   08/18/18 154 lb (69.9 kg)   08/04/18 151 lb (68.5 kg)   07/14/18 157 lb (71.2 kg)   03/03/18 161 lb (73 kg)     BP Readings from Last 5 Encounters:   09/19/18 119/72   08/18/18 107/50   08/04/18 130/61   07/14/18 129/51   03/01/18 108/61       Hgb A1c - HPLC   Date Value Ref Range Status   07/14/2018 7.5 (H) <5.7 % Final     Comment:     For patients with diabetes, an A1c less than (<) or equal (=) to 7.0% is recommended for most patients, however the goal may be higher or lower depending on age and/or other medical problems.   For a diagnosis of diabetes, A1c greater than (>) or equal(=) to 6.5% indicates diabetes; values between 5.7% and 6.4% may indicate an increased risk of developing diabetes.   01/27/2018 9.4 (H) <5.7 % Final     Comment:     For patients with diabetes, an A1c less than (<) or equal (=) to 7.0% is recommended for most patients, however the goal may be higher or lower depending on age and/or other medical problems.   For a diagnosis of diabetes, A1c greater than (>) or equal(=) to 6.5% indicates diabetes; values between 5.7% and 6.4% may indicate an increased risk of developing diabetes.   12/20/2017 9.6 (H) <5.7 % Final     Comment:     For patients with diabetes, an A1c less than (<) or equal (=) to 7.0% is recommended for most patients, however the goal may be higher or lower depending on age and/or other medical problems.   For a diagnosis of diabetes, A1c greater than (>) or equal(=) to 6.5% indicates diabetes; values between 5.7% and 6.4% may indicate an increased risk of developing diabetes.     Cholesterol   Date Value Ref Range Status   07/14/2018 165 See Comment mg/dL Final     Comment:     The significance of total cholesterol depends on the values of LDL, HDL, triglycerides and the clinical context. A patient-provider discussion may be considered.       01/27/2018 185 See Comment mg/dL Final     Comment:     The significance of total cholesterol depends on the values of LDL, HDL, triglycerides and the clinical context. A patient-provider discussion may be considered.       09/03/2017 182 See Comment mg/dL Final     Comment:       The significance of total cholesterol depends on the values of LDL, HDL, triglycerides and the clinical context. A patient-provider discussion may be considered.         Cholesterol,LDL,Calc   Date Value Ref Range Status   07/14/2018 47 <100 mg/dL Final     Comment:     If LDL value falls outside of the designated range AND if  included in any of the following categories, a  patient-provider discussion is recommended. Statin therapy is recommended for individuals:  1. with clinical atherosclerotic cardiovascular disease     irrespective of LDL levels;  2. with LDL > or = 190 mg/dL;  3. with diabetes, aged 40-75 years, with LDL between 70 and     189 mg/dL;  4. without any of the above but who have LDL between 70 and     189 mg/dL and an  estimated 10-year risk of     atherosclerotic cardiovascular disease > or = 7.5%     (consider statin therapy if estimated 10-year risk > or =     5.0%) (ACC/AHA 2013 Guidelines).   05/29/2016 74 <100 mg/dL Final     Comment:     If LDL value falls outside of the designated range AND if  included in any of the following categories, a  patient-provider discussion is recommended.     Statin therapy is recommended for individuals:  1. with clinical atherosclerotic cardiovascular disease     irrespective of LDL levels;  2. with LDL > or = 190 mg/dL;  3. with diabetes, aged 40-75 years, with LDL between 70 and     189 mg/dL;  4. without any of the above but who have LDL between 70 and     189 mg/dL and an estimated 16-XWRU risk of     atherosclerotic cardiovascular disease > or = 7.5%     (consider statin therapy if estimated 10-year risk > or =     5.0%) (ACC/AHA 2013 Guidelines).   10/23/2015 61 <100 mg/dL Final     Comment:     If LDL value falls outside of the designated range AND if  included in any of the following categories, a  patient-provider discussion is recommended.     Statin therapy is recommended for individuals:  1. with clinical atherosclerotic cardiovascular disease     irrespective of LDL levels;  2. with LDL > or = 190 mg/dL;  3. with diabetes, aged 40-75 years, with LDL between 70 and     189 mg/dL;  4. without any of the above but who have LDL between 70 and     189 mg/dL and an estimated 04-VWUJ risk of     atherosclerotic cardiovascular disease > or = 7.5%     (consider statin therapy if estimated 10-year risk > or =     5.0%) (ACC/AHA 2013 Guidelines).     Cholesterol, HDL Date Value Ref Range Status   07/14/2018 104 >40 mg/dL Final     Comment:     If HDL cholesterol level falls outside of the designated  range, a patient-provider discussion is recommended   01/27/2018 99 >40 mg/dL Final     Comment:     If HDL cholesterol level falls outside of the designated  range, a patient-provider discussion is recommended   09/03/2017 94 >40 mg/dL Final     Comment:     If HDL cholesterol level falls outside of the designated  range, a patient-provider discussion is recommended     Triglycerides   Date Value Ref Range Status   07/14/2018 70 <150 mg/dL Final     Comment:     If Triglyceride level falls outside of the designated range,  a patient-provider discussion is recommended.     01/27/2018 87 <150 mg/dL Final     Comment:     If Triglyceride level falls outside of the designated range,  a patient-provider discussion is recommended.     09/03/2017 81 <150 mg/dL Final     Comment:     Patient is non-fasting, interpret with caution.    If Triglyceride level falls outside of the designated range,  a patient-provider discussion is recommended.        Creatinine   Date Value Ref Range Status   07/14/2018 0.85 0.60 - 1.30 mg/dL Final   81/19/1478 2.95 0.60 - 1.30 mg/dL Final  12/20/2017 0.89 0.60 - 1.30 mg/dL Final        PHQ-9 Results  Depression Screening (Patient Health Questionnaire PHQ) 08/18/2012 10/19/2014 10/19/2014 09/11/2015 12/24/2016 01/22/2017 01/27/2018   PHQ-2: Feeling down, depressed, or hopeless - No No No No No No   PHQ-2: Little interest or pleassure in doing things - No No No No No No   Little interest or pleasure in doing things Not at all - - - - - -   Feeling down, depressed, or hopeless Not at all - - - - - -   Total Score 0 - - - - - -   Some recent data might be hidden       GAD-7 Results  No flowsheet data found.    DAST Results  No flowsheet data found.    Audit-C results  No flowsheet data found.      No follow-ups on file. The above plan of care, diagnosis, orders, and follow-up were discussed with the patient.  Questions related to this recommended plan of care were answered.  Lonzo Cloud. Karolee Ohs, DO  09/19/2018    Author:  Lonzo Cloud. Tyjuan Demetro 09/19/2018 4:35 PM

## 2018-09-20 LAB — Magnesium: MAGNESIUM: 1.5 meq/L (ref 1.4–1.9)

## 2018-09-20 LAB — TSH with reflex FT4, FT3: TSH: 0.74 u[IU]/mL (ref 0.3–4.7)

## 2018-09-20 LAB — Differential Automated: BASOPHIL PERCENT, AUTO: 1 (ref 0.00–0.04)

## 2018-09-20 LAB — Lipid Panel: CHOLESTEROL,LDL,CALCULATED: 66 mg/dL (ref <100)

## 2018-09-20 LAB — Albumin/Creatinine Ratio,Urine: ALBUMIN/CREATININE RATIO: 9.1 mg/dL

## 2018-09-20 LAB — UA,Dipstick: PH,URINE: 6 (ref 5.0–8.0)

## 2018-09-20 LAB — UA,Microscopic: RBCS: 0 {cells}/uL (ref 0–11)

## 2018-09-20 LAB — Vitamin D,25-Hydroxy: VITAMIN D,25-HYDROXY: 55 ng/mL — ABNORMAL HIGH (ref 20–50)

## 2018-09-20 LAB — CBC: MCH CONCENTRATION: 33.2 g/dL (ref 31.5–35.5)

## 2018-09-20 LAB — Folate,Serum: FOLATE,SERUM: 16.9 ng/mL (ref 8.1–30.4)

## 2018-09-20 LAB — Vitamin B12: VITAMIN B12: >4000 pg/mL — ABNORMAL HIGH (ref 254–1060)

## 2018-09-20 LAB — Uric Acid: URIC ACID: 7.2 mg/dL (ref 3.4–8.8)

## 2018-09-20 LAB — Comprehensive Metabolic Panel
CALCIUM: 9.9 mg/dL (ref 8.6–10.4)
SODIUM: 135 mmol/L (ref 135–146)

## 2018-09-20 LAB — Hgb A1c: HGB A1C - HPLC: 7.7 — ABNORMAL HIGH (ref <5.7)

## 2018-09-20 LAB — PTH, Intact: PTH, INTACT: 48 pg/mL (ref 11–51)

## 2018-09-22 ENCOUNTER — Telehealth: Payer: BLUE CROSS/BLUE SHIELD

## 2018-09-22 NOTE — Telephone Encounter
Hello Dr.Gregor,     Can an order/referral be placed for diabetes education, patient would like to schedule with Ida Rogue,       Thank you Francene Castle

## 2018-09-23 MED ORDER — INSULIN LISPRO 100 UNIT/ML SC SOLN
2 refills | 30.00000 days | Status: AC
Start: 2018-09-23 — End: ?

## 2018-09-23 MED ORDER — AMLODIPINE BESY-BENAZEPRIL HCL 5-20 MG PO CAPS
1 | ORAL_CAPSULE | Freq: Two times a day (BID) | ORAL | 1 refills | 60.00000 days | Status: AC
Start: 2018-09-23 — End: ?

## 2018-09-23 NOTE — Telephone Encounter
Please place referral for diabetic medical nutrition.

## 2018-09-24 NOTE — Telephone Encounter
Left vmail to ask if pt has used the current referral on file to [place] and I he already used it. Also to confirm if Rulon Abide is with [facility].     Referral number:  [id number]

## 2018-09-28 NOTE — Addendum Note
Addended byAsencion Noble on: 09/28/2018 04:35 PM     Modules accepted: Orders

## 2018-09-28 NOTE — Telephone Encounter
Called pt. Per Daughter Wells Guiles pt is not home. She will inform pt to r/c. I went ahead and entered auth to [employer] as pt requested. Pt has PPO insur and can basically be seen by any specialist without referral.      Note:  Please see referral number [id number] and provide details to pt.

## 2018-09-29 ENCOUNTER — Telehealth: Payer: BLUE CROSS/BLUE SHIELD

## 2018-09-29 NOTE — Telephone Encounter
Hi Ana,     Appointment scheduled by daughter Wells Guiles who will be present during visit, per daughters request the conversation to be initiated in [place]. Will e-mail log information and I will forward the message once received. Scheduled for a zoom visit on 9/30 at 11 am.       Thank you Francene Castle

## 2018-10-02 NOTE — Progress Notes
PATIENT: Chad Watkins  MRN: [id number]  DOB: 09-13-1945  DATE OF SERVICE: 08/18/2018    CHIEF COMPLAINT:   Chief Complaint   Patient presents with   ? Diabetes   ? Hypertension     Diabetes   Pertinent negatives for hypoglycemia include no confusion, dizziness, headaches, nervousness/anxiousness, pallor, seizures, speech difficulty or tremors. Pertinent negatives for diabetes include no chest pain, no fatigue, no polydipsia, no polyphagia, no polyuria and no weakness.   Hypertension   Pertinent negatives include no chest pain, headaches, neck pain, palpitations or shortness of breath.         Past Medical History:   Diagnosis Date   ? Diabetic neuropathy (HCC/RAF) 01/27/2018   ? Diabetic peripheral vascular disease (HCC/RAF) 09/03/2017   ? DM type 1 with diabetic mixed hyperlipidemia (HCC/RAF) 09/03/2017   ? Edema leg 07/14/2018   ? History of fracture of left ankle 12/01/2017   ? Hypertension associated with diabetes (HCC/RAF) 09/03/2017   ? Peripheral vascular disease (HCC/RAF) 09/03/2017   ? Trigger thumb 01/27/2018   ? Type 1 diabetes mellitus with diabetic neuropathy, unspecified (HCC/RAF) 09/03/2017       No past surgical history on file.    Social History     Tobacco Use   Smoking Status Never Smoker   Smokeless Tobacco Never Used       No family history on file.    Social History     Substance and Sexual Activity   Alcohol Use Yes   ? Alcohol/week: 4.2 oz   ? Types: 7 Standard drinks or equivalent per week       Social History     Substance and Sexual Activity   Drug Use Not on file       No Known Allergies    Patient Active Problem List   Diagnosis   ? Bursitis   ? Skin cancer   ? Hyperlipidemia   ? Palpitation   ? Need for vaccination   ? Healthcare maintenance   ? Hypertension, well controlled   ? Abnormal EKG   ? Left ankle pain   ? Hypertension associated with diabetes (HCC/RAF)   ? DM type 1 with diabetic mixed hyperlipidemia (HCC/RAF)   ? DM (diabetes mellitus), type 1, uncontrolled, periph vascular complic (HCC/RAF)   ? Peripheral vascular disease (HCC/RAF)   ? DM (diabetes mellitus), type 1, uncontrolled w/neurologic complication (HCC/RAF)   ? History of fracture of left ankle   ? Diabetic neuropathy (HCC/RAF)   ? Trigger thumb   ? Edema leg         Current Outpatient Medications:   ?  acetaminophen-codeine (TYLENOL #3) 300-30 mg tablet, TAKE 1 TABLET EVERY 4 HOURS AS NEEDED FOR PAIN, Disp: , Rfl: 0  ?  AMLODIPINE-BENAZEPRIL 5-20 mg capsule, TAKE 1 CAPSULE BY MOUTH TWO (2) TIMES DAILY., Disp: 180 capsule, Rfl: 1  ?  ciclopirox 8% solution, Apply topically at bedtime Apply over nail and surrounding skin.., Disp: 1 bottle, Rfl: 3  ?  FLUZONE HIGH-DOSE 0.5 ML syringe, , Disp: , Rfl:   ?  furosemide 20 mg tablet, Take 1 tablet (20 mg total) by mouth every morning., Disp: 90 tablet, Rfl: 0  ?  gatifloxacin 0.5% ophthalmic solution, , Disp: , Rfl:   ?  glucagon (BAQSIMI TWO PACK) 3 mg/dose nasal powder, 1 actuation (3 mg total) by Left Nare route every fifteen (15) minutes as needed., Disp: 3 each, Rfl: 1  ?  glucose blood test strip, Use  as instructed, Disp: 100 each, Rfl: 12  ?  insulin glargine (LANTUS) 100 units/mL injection vial, 5 UNITS QAM, 3 UNITS QPM., Disp: 3 vial, Rfl: 1  ?  insulin lispro, HumaLOG, (HUMALOG) 100 unit/mL injection vial, 3 units sq qac plus insulin sliding scale patient has.  Hold for sugar <100., Disp: 30 mL, Rfl: 2  ?  insulin lispro, HumaLOG, (HUMALOG) 100 unit/mL injection vial, 3 UNITS EACH MEAL PLUS AMOUNT DETERMINED BY MD'S SLIDING SCALE; HOLD FOR SUGAR UNDER 100., Disp: 30 mL, Rfl: 1  ?  insulin syringe needle U-100 (BD INSULIN SYRINGE U/F) 31G X 5/16'' 0.3 mL syringe, Up to 7 times a day., Disp: 700 each, Rfl: 6  ?  LOTEMAX SM 0.38 % GEL, , Disp: , Rfl:   ?  metFORMIN (GLUCOPHAGE XR) 500 mg PO ER 24 hr tablet, Take 2 tablets (1,000 mg total) by mouth two (2) times daily with meals., Disp: 360 tablet, Rfl: 3  ?  ONETOUCH ULTRA BLUE test strip, USE 1 TEST STRIP TESTING 5 TIMES A DAY., Disp: 500 strip, Rfl: 3  ?  PROLENSA 0.07 % SOLN, , Disp: , Rfl:   ?  SHINGRIX 50 MCG/0.5ML injection, , Disp: , Rfl:   ?  sildenafil 100 mg tablet, Take 1 tablet (100 mg total) by mouth daily as needed for Erectile Dysfunction., Disp: 10 tablet, Rfl: 10  ?  simvastatin (ZOCOR) 20 mg tablet, TOME UNA TABLETA DIARIAMENTE, Disp: 90 tablet, Rfl: 3  ?  SIMVASTATIN 20 mg tablet, TOME UNA TABLETA TODOS LOS DIAS AL ACOSTARSE, Disp: 90 tablet, Rfl: 3    Health Maintenance   Topic Date Due   ? Advance Directive  05/19/2009   ? Influenza Vaccine (1) 09/20/2018   ? Diabetes: EYE EXAM  11/03/2018   ? Annual Preventive Wellness Visit  01/28/2019   ? Diabetes: HGB A1C  03/19/2019   ? Diabetes: NEPHROPATHY MONITORING  09/23/2019   ? Tdap/Td Vaccine (2 - Td) 07/19/2022   ? Colon Ca Screening: COLONOSCOPY  03/02/2023   ? Pneumococcal Vaccine  Completed   ? Hepatitis C Screening  Completed   ? Shingles (Shingrix) Vaccine  Completed   ? Statin prescribed for ASCVD Prevention or Treatment  Completed       Patient Care Team:  Trena Platt., DO as PCP - General (Family Medicine)      Subjective:      Chad Watkins is a 73 y.o. male.    Review of Systems   Constitutional: Negative.  Negative for activity change, appetite change, chills, diaphoresis, fatigue, fever and unexpected weight change.   HENT: Negative.  Negative for congestion, dental problem, drooling, ear discharge, ear pain, facial swelling, hearing loss, mouth sores, nosebleeds, postnasal drip, rhinorrhea, sinus pressure, sinus pain, sneezing, sore throat, tinnitus, trouble swallowing and voice change.    Eyes: Negative.  Negative for photophobia, pain, discharge, redness, itching and visual disturbance.   Respiratory: Negative.  Negative for apnea, cough, choking, chest tightness, shortness of breath, wheezing and stridor.    Cardiovascular: Negative.  Negative for chest pain, palpitations and leg swelling. Gastrointestinal: Negative.  Negative for abdominal distention, abdominal pain, anal bleeding, blood in stool, constipation, diarrhea, nausea, rectal pain and vomiting.   Endocrine: Negative.  Negative for cold intolerance, heat intolerance, polydipsia, polyphagia and polyuria.   Genitourinary: Negative.  Negative for decreased urine volume, difficulty urinating, discharge, dysuria, enuresis, flank pain, frequency, genital sores, hematuria, penile pain, penile swelling, scrotal swelling, testicular pain and urgency.  Musculoskeletal: Negative.  Negative for arthralgias, back pain, gait problem, joint swelling, myalgias, neck pain and neck stiffness.   Skin: Negative.  Negative for color change, pallor, rash and wound.   Allergic/Immunologic: Negative.  Negative for environmental allergies, food allergies and immunocompromised state.   Neurological: Negative.  Negative for dizziness, tremors, seizures, syncope, facial asymmetry, speech difficulty, weakness, light-headedness, numbness and headaches.   Hematological: Negative.  Negative for adenopathy. Does not bruise/bleed easily.   Psychiatric/Behavioral: Negative.  Negative for agitation, behavioral problems, confusion, decreased concentration, dysphoric mood, hallucinations, self-injury, sleep disturbance and suicidal ideas. The patient is not nervous/anxious and is not hyperactive.    All other systems reviewed and are negative.        Objective:      Physical Exam  Vitals signs reviewed.   Constitutional:       General: He is not in acute distress.     Appearance: Normal appearance. He is well-developed and normal weight. He is not ill-appearing, toxic-appearing or diaphoretic.      Comments: BP 107/50  ~ Pulse 63  ~ Temp 36.4 ?C (97.6 ?F) (Tympanic)  ~ Wt 154 lb (69.9 kg)  ~ BMI 25.63 kg/m?      HENT:      Head: Normocephalic and atraumatic.      Right Ear: External ear normal.      Left Ear: External ear normal.      Nose: Nose normal.   Eyes: Conjunctiva/sclera: Conjunctivae normal.   Neck:      Musculoskeletal: Normal range of motion and neck supple. No neck rigidity or muscular tenderness.   Pulmonary:      Effort: Pulmonary effort is normal.   Lymphadenopathy:      Cervical: No cervical adenopathy.   Skin:     General: Skin is warm and dry.   Neurological:      General: No focal deficit present.      Mental Status: He is alert and oriented to person, place, and time.   Psychiatric:         Mood and Affect: Mood normal.         Behavior: Behavior normal.         Thought Content: Thought content normal.         Judgment: Judgment normal.             ASSESSMENT AND PLAN     1. Hypertension associated with diabetes (HCC/RAF)    2. DM type 1 with diabetic mixed hyperlipidemia (HCC/RAF)    3. DM (diabetes mellitus), type 1, uncontrolled w/neurologic complication (HCC/RAF)    4. DM (diabetes mellitus), type 1, uncontrolled, periph vascular complic (HCC/RAF)    5. Diabetic polyneuropathy associated with type 1 diabetes mellitus (HCC/RAF)    6. Peripheral vascular disease (HCC/RAF)    7. Healthcare maintenance      Continued work and discussion of sugar and diet.  Re-iterate need for endo and dietitian.  Continue per current recommendations as things seem to be improving.      Orders Placed This Encounter   ? metFORMIN (GLUCOPHAGE XR) 500 mg PO ER 24 hr tablet       Wt Readings from Last 5 Encounters:   09/19/18 150 lb (68 kg)   08/18/18 154 lb (69.9 kg)   08/04/18 151 lb (68.5 kg)   07/14/18 157 lb (71.2 kg)   03/03/18 161 lb (73 kg)     BP Readings from Last 5 Encounters:   09/19/18 119/72  08/18/18 107/50   08/04/18 130/61   07/14/18 129/51   03/01/18 108/61       Hgb A1c - HPLC   Date Value Ref Range Status   09/19/2018 7.7 (H) <5.7 % Final     Comment:     For patients with diabetes, an A1c less than (<) or equal (=) to 7.0% is recommended for most patients, however the goal may be higher or lower depending on age and/or other medical problems. For a diagnosis of diabetes, A1c greater than (>) or equal(=) to 6.5% indicates diabetes; values between 5.7% and 6.4% may indicate an increased risk of developing diabetes.   07/14/2018 7.5 (H) <5.7 % Final     Comment:     For patients with diabetes, an A1c less than (<) or equal (=) to 7.0% is recommended for most patients, however the goal may be higher or lower depending on age and/or other medical problems.   For a diagnosis of diabetes, A1c greater than (>) or equal(=) to 6.5% indicates diabetes; values between 5.7% and 6.4% may indicate an increased risk of developing diabetes.   01/27/2018 9.4 (H) <5.7 % Final     Comment:     For patients with diabetes, an A1c less than (<) or equal (=) to 7.0% is recommended for most patients, however the goal may be higher or lower depending on age and/or other medical problems.   For a diagnosis of diabetes, A1c greater than (>) or equal(=) to 6.5% indicates diabetes; values between 5.7% and 6.4% may indicate an increased risk of developing diabetes.     Cholesterol   Date Value Ref Range Status   09/19/2018 184 See Comment mg/dL Final     Comment:     The significance of total cholesterol depends on the values of LDL, HDL, triglycerides and the clinical context. A patient-provider discussion may be considered.       07/14/2018 165 See Comment mg/dL Final     Comment:     The significance of total cholesterol depends on the values of LDL, HDL, triglycerides and the clinical context. A patient-provider discussion may be considered.       01/27/2018 185 See Comment mg/dL Final     Comment:     The significance of total cholesterol depends on the values of LDL, HDL, triglycerides and the clinical context. A patient-provider discussion may be considered.         Cholesterol,LDL,Calc   Date Value Ref Range Status   09/19/2018 66 <100 mg/dL Final     Comment:     If LDL value falls outside of the designated range AND if  included in any of the following categories, a patient-provider discussion is recommended.     Statin therapy is recommended for individuals:  1. with clinical atherosclerotic cardiovascular disease     irrespective of LDL levels;  2. with LDL > or = 190 mg/dL;  3. with diabetes, aged 40-75 years, with LDL between 70 and     189 mg/dL;  4. without any of the above but who have LDL between 70 and     189 mg/dL and an estimated 16-XWRU risk of     atherosclerotic cardiovascular disease > or = 7.5%     (consider statin therapy if estimated 10-year risk > or =     5.0%) (ACC/AHA 2013 Guidelines).   07/14/2018 47 <100 mg/dL Final     Comment:     If LDL value falls outside  of the designated range AND if  included in any of the following categories, a  patient-provider discussion is recommended.     Statin therapy is recommended for individuals:  1. with clinical atherosclerotic cardiovascular disease     irrespective of LDL levels;  2. with LDL > or = 190 mg/dL;  3. with diabetes, aged 40-75 years, with LDL between 70 and     189 mg/dL;  4. without any of the above but who have LDL between 70 and     189 mg/dL and an estimated 91-YNWG risk of     atherosclerotic cardiovascular disease > or = 7.5%     (consider statin therapy if estimated 10-year risk > or =     5.0%) (ACC/AHA 2013 Guidelines).   05/29/2016 74 <100 mg/dL Final     Comment:     If LDL value falls outside of the designated range AND if  included in any of the following categories, a  patient-provider discussion is recommended.     Statin therapy is recommended for individuals:  1. with clinical atherosclerotic cardiovascular disease     irrespective of LDL levels;  2. with LDL > or = 190 mg/dL;  3. with diabetes, aged 40-75 years, with LDL between 70 and     189 mg/dL;  4. without any of the above but who have LDL between 70 and     189 mg/dL and an estimated 95-AOZH risk of     atherosclerotic cardiovascular disease > or = 7.5%     (consider statin therapy if estimated 10-year risk > or = 5.0%) (ACC/AHA 2013 Guidelines).     Cholesterol, HDL   Date Value Ref Range Status   09/19/2018 105 >40 mg/dL Final     Comment:     If HDL cholesterol level falls outside of the designated  range, a patient-provider discussion is recommended   07/14/2018 104 >40 mg/dL Final     Comment:     If HDL cholesterol level falls outside of the designated  range, a patient-provider discussion is recommended   01/27/2018 99 >40 mg/dL Final     Comment:     If HDL cholesterol level falls outside of the designated  range, a patient-provider discussion is recommended     Triglycerides   Date Value Ref Range Status   09/19/2018 66 <150 mg/dL Final     Comment:     If Triglyceride level falls outside of the designated range,  a patient-provider discussion is recommended.     07/14/2018 70 <150 mg/dL Final     Comment:     If Triglyceride level falls outside of the designated range,  a patient-provider discussion is recommended.     01/27/2018 87 <150 mg/dL Final     Comment:     If Triglyceride level falls outside of the designated range,  a patient-provider discussion is recommended.        Creatinine   Date Value Ref Range Status   09/19/2018 0.84 0.60 - 1.30 mg/dL Final   08/65/7846 9.62 0.60 - 1.30 mg/dL Final   95/28/4132 4.40 0.60 - 1.30 mg/dL Final        PHQ-9 Results  Depression Screening (Patient Health Questionnaire PHQ) 08/18/2012 10/19/2014 10/19/2014 09/11/2015 12/24/2016 01/22/2017 01/27/2018   PHQ-2: Feeling down, depressed, or hopeless - No No No No No No   PHQ-2: Little interest or pleassure in doing things - No No No No No No   Little interest or pleasure  in doing things Not at all - - - - - -   Feeling down, depressed, or hopeless Not at all - - - - - -   Total Score 0 - - - - - -   Some recent data might be hidden       GAD-7 Results  No flowsheet data found.    DAST Results  No flowsheet data found.    Audit-C results  No flowsheet data found.      No follow-ups on file. The above plan of care, diagnosis, orders, and follow-up were discussed with the patient.  Questions related to this recommended plan of care were answered.  Lonzo Cloud. Karolee Ohs, DO  10/02/2018    Author:  Lonzo Cloud. Hazyl Marseille 10/02/2018 8:46 AM

## 2018-10-02 NOTE — Progress Notes
PATIENT: Chad Watkins  MRN: [id number]  DOB: February 10, 1945  DATE OF SERVICE: 08/04/2018    CHIEF COMPLAINT:   Chief Complaint   Patient presents with   ? Follow-up   ? Hypertension   ? Diabetes     Hypertension   Pertinent negatives include no chest pain, headaches, neck pain, palpitations or shortness of breath.   Diabetes   Pertinent negatives for hypoglycemia include no confusion, dizziness, headaches, nervousness/anxiousness, pallor, seizures, speech difficulty or tremors. Pertinent negatives for diabetes include no chest pain, no fatigue, no polydipsia, no polyphagia, no polyuria and no weakness.         Past Medical History:   Diagnosis Date   ? Diabetic neuropathy (HCC/RAF) 01/27/2018   ? Diabetic peripheral vascular disease (HCC/RAF) 09/03/2017   ? DM type 1 with diabetic mixed hyperlipidemia (HCC/RAF) 09/03/2017   ? Edema leg 07/14/2018   ? History of fracture of left ankle 12/01/2017   ? Hypertension associated with diabetes (HCC/RAF) 09/03/2017   ? Peripheral vascular disease (HCC/RAF) 09/03/2017   ? Trigger thumb 01/27/2018   ? Type 1 diabetes mellitus with diabetic neuropathy, unspecified (HCC/RAF) 09/03/2017       No past surgical history on file.    Social History     Tobacco Use   Smoking Status Never Smoker   Smokeless Tobacco Never Used       No family history on file.    Social History     Substance and Sexual Activity   Alcohol Use Yes   ? Alcohol/week: 4.2 oz   ? Types: 7 Standard drinks or equivalent per week       Social History     Substance and Sexual Activity   Drug Use Not on file       No Known Allergies    Patient Active Problem List   Diagnosis   ? Bursitis   ? Skin cancer   ? Hyperlipidemia   ? Palpitation   ? Need for vaccination   ? Healthcare maintenance   ? Hypertension, well controlled   ? Abnormal EKG   ? Left ankle pain   ? Hypertension associated with diabetes (HCC/RAF)   ? DM type 1 with diabetic mixed hyperlipidemia (HCC/RAF) ? DM (diabetes mellitus), type 1, uncontrolled, periph vascular complic (HCC/RAF)   ? Peripheral vascular disease (HCC/RAF)   ? DM (diabetes mellitus), type 1, uncontrolled w/neurologic complication (HCC/RAF)   ? History of fracture of left ankle   ? Diabetic neuropathy (HCC/RAF)   ? Trigger thumb   ? Edema leg         Current Outpatient Medications:   ?  acetaminophen-codeine (TYLENOL #3) 300-30 mg tablet, TAKE 1 TABLET EVERY 4 HOURS AS NEEDED FOR PAIN, Disp: , Rfl: 0  ?  ciclopirox 8% solution, Apply topically at bedtime Apply over nail and surrounding skin.., Disp: 1 bottle, Rfl: 3  ?  FLUZONE HIGH-DOSE 0.5 ML syringe, , Disp: , Rfl:   ?  furosemide 20 mg tablet, Take 1 tablet (20 mg total) by mouth every morning., Disp: 90 tablet, Rfl: 0  ?  gatifloxacin 0.5% ophthalmic solution, , Disp: , Rfl:   ?  glucagon (BAQSIMI TWO PACK) 3 mg/dose nasal powder, 1 actuation (3 mg total) by Left Nare route every fifteen (15) minutes as needed., Disp: 3 each, Rfl: 1  ?  glucose blood test strip, Use as instructed, Disp: 100 each, Rfl: 12  ?  LOTEMAX SM 0.38 % GEL, , Disp: , Rfl:   ?  ONETOUCH ULTRA BLUE test strip, USE 1 TEST STRIP TESTING 5 TIMES A DAY., Disp: 500 strip, Rfl: 3  ?  PROLENSA 0.07 % SOLN, , Disp: , Rfl:   ?  SHINGRIX 50 MCG/0.5ML injection, , Disp: , Rfl:   ?  sildenafil 100 mg tablet, Take 1 tablet (100 mg total) by mouth daily as needed for Erectile Dysfunction., Disp: 10 tablet, Rfl: 10  ?  simvastatin (ZOCOR) 20 mg tablet, TOME UNA TABLETA DIARIAMENTE, Disp: 90 tablet, Rfl: 3  ?  SIMVASTATIN 20 mg tablet, TOME UNA TABLETA TODOS LOS DIAS AL ACOSTARSE, Disp: 90 tablet, Rfl: 3  ?  AMLODIPINE-BENAZEPRIL 5-20 mg capsule, TAKE 1 CAPSULE BY MOUTH TWO (2) TIMES DAILY., Disp: 180 capsule, Rfl: 1  ?  insulin glargine (LANTUS) 100 units/mL injection vial, 5 UNITS QAM, 3 UNITS QPM., Disp: 3 vial, Rfl: 1  ?  insulin lispro, HumaLOG, (HUMALOG) 100 unit/mL injection vial, 3 units sq qac plus insulin sliding scale patient has.  Hold for sugar <100., Disp: 30 mL, Rfl: 2  ?  insulin lispro, HumaLOG, (HUMALOG) 100 unit/mL injection vial, 3 UNITS EACH MEAL PLUS AMOUNT DETERMINED BY MD'S SLIDING SCALE; HOLD FOR SUGAR UNDER 100., Disp: 30 mL, Rfl: 1  ?  insulin syringe needle U-100 (BD INSULIN SYRINGE U/F) 31G X 5/16'' 0.3 mL syringe, Up to 7 times a day., Disp: 700 each, Rfl: 6  ?  metFORMIN (GLUCOPHAGE XR) 500 mg PO ER 24 hr tablet, Take 2 tablets (1,000 mg total) by mouth two (2) times daily with meals., Disp: 360 tablet, Rfl: 3    Health Maintenance   Topic Date Due   ? Advance Directive  05/19/2009   ? Influenza Vaccine (1) 09/20/2018   ? Diabetes: EYE EXAM  11/03/2018   ? Annual Preventive Wellness Visit  01/28/2019   ? Diabetes: HGB A1C  03/19/2019   ? Diabetes: NEPHROPATHY MONITORING  09/23/2019   ? Tdap/Td Vaccine (2 - Td) 07/19/2022   ? Colon Ca Screening: COLONOSCOPY  03/02/2023   ? Pneumococcal Vaccine  Completed   ? Hepatitis C Screening  Completed   ? Shingles (Shingrix) Vaccine  Completed   ? Statin prescribed for ASCVD Prevention or Treatment  Completed       Patient Care Team:  Trena Platt., DO as PCP - General (Family Medicine)      Subjective:      Chad Watkins is a 73 y.o. male.    Review of Systems   Constitutional: Negative.  Negative for activity change, appetite change, chills, diaphoresis, fatigue, fever and unexpected weight change.   HENT: Negative.  Negative for congestion, dental problem, drooling, ear discharge, ear pain, facial swelling, hearing loss, mouth sores, nosebleeds, postnasal drip, rhinorrhea, sinus pressure, sinus pain, sneezing, sore throat, tinnitus, trouble swallowing and voice change.    Eyes: Negative.  Negative for photophobia, pain, discharge, redness, itching and visual disturbance.   Respiratory: Negative.  Negative for apnea, cough, choking, chest tightness, shortness of breath, wheezing and stridor. Cardiovascular: Negative.  Negative for chest pain, palpitations and leg swelling.   Gastrointestinal: Negative.  Negative for abdominal distention, abdominal pain, anal bleeding, blood in stool, constipation, diarrhea, nausea, rectal pain and vomiting.   Endocrine: Negative.  Negative for cold intolerance, heat intolerance, polydipsia, polyphagia and polyuria.   Genitourinary: Negative.  Negative for decreased urine volume, difficulty urinating, discharge, dysuria, enuresis, flank pain, frequency, genital sores, hematuria, penile pain, penile swelling, scrotal swelling, testicular pain and urgency.   Musculoskeletal:  Negative.  Negative for arthralgias, back pain, gait problem, joint swelling, myalgias, neck pain and neck stiffness.   Skin: Negative.  Negative for color change, pallor, rash and wound.   Allergic/Immunologic: Negative.  Negative for environmental allergies, food allergies and immunocompromised state.   Neurological: Negative.  Negative for dizziness, tremors, seizures, syncope, facial asymmetry, speech difficulty, weakness, light-headedness, numbness and headaches.   Hematological: Negative.  Negative for adenopathy. Does not bruise/bleed easily.   Psychiatric/Behavioral: Negative.  Negative for agitation, behavioral problems, confusion, decreased concentration, dysphoric mood, hallucinations, self-injury, sleep disturbance and suicidal ideas. The patient is not nervous/anxious and is not hyperactive.    All other systems reviewed and are negative.        Objective:      Physical Exam  Vitals signs reviewed.   Constitutional:       General: He is not in acute distress.     Appearance: Normal appearance. He is well-developed and normal weight. He is not ill-appearing, toxic-appearing or diaphoretic.      Comments: BP 130/61  ~ Pulse 79  ~ Temp 36.4 ?C (97.5 ?F) (Forehead)  ~ Wt 151 lb (68.5 kg)  ~ BMI 25.13 kg/m?      HENT:      Head: Normocephalic and atraumatic. Right Ear: External ear normal.      Left Ear: External ear normal.      Nose: Nose normal.   Eyes:      Conjunctiva/sclera: Conjunctivae normal.   Neck:      Musculoskeletal: Normal range of motion and neck supple. No neck rigidity or muscular tenderness.   Pulmonary:      Effort: Pulmonary effort is normal.   Lymphadenopathy:      Cervical: No cervical adenopathy.   Skin:     General: Skin is warm and dry.   Neurological:      General: No focal deficit present.      Mental Status: He is alert and oriented to person, place, and time.   Psychiatric:         Mood and Affect: Mood normal.         Behavior: Behavior normal.         Thought Content: Thought content normal.         Judgment: Judgment normal.             ASSESSMENT AND PLAN     1. Hypertension associated with diabetes (HCC/RAF)    2. DM type 1 with diabetic mixed hyperlipidemia (HCC/RAF)    3. DM (diabetes mellitus), type 1, uncontrolled w/neurologic complication (HCC/RAF)    4. DM (diabetes mellitus), type 1, uncontrolled, periph vascular complic (HCC/RAF)    5. Diabetic polyneuropathy associated with type 1 diabetes mellitus (HCC/RAF)    6. Peripheral vascular disease (HCC/RAF)    7. Healthcare maintenance      From last visit to now, discussed with patient over phone that without lantus at all sugars were getting to 400's and difficult to control so added 5 units lantus qam.  Better control but down to 200-300's even with SSI.  Will add nighttime dose of 5 units - back down if showing hypoglycemia like have had notoriously up until now. After last visit and stopping lantus, no hypoglycemic episodes.      No orders of the defined types were placed in this encounter.      Wt Readings from Last 5 Encounters:   09/19/18 150 lb (68 kg)   08/18/18 154 lb (69.9  kg)   08/04/18 151 lb (68.5 kg)   07/14/18 157 lb (71.2 kg)   03/03/18 161 lb (73 kg)     BP Readings from Last 5 Encounters:   09/19/18 119/72   08/18/18 107/50   08/04/18 130/61 07/14/18 129/51   03/01/18 108/61       Hgb A1c - HPLC   Date Value Ref Range Status   09/19/2018 7.7 (H) <5.7 % Final     Comment:     For patients with diabetes, an A1c less than (<) or equal (=) to 7.0% is recommended for most patients, however the goal may be higher or lower depending on age and/or other medical problems.   For a diagnosis of diabetes, A1c greater than (>) or equal(=) to 6.5% indicates diabetes; values between 5.7% and 6.4% may indicate an increased risk of developing diabetes.   07/14/2018 7.5 (H) <5.7 % Final     Comment:     For patients with diabetes, an A1c less than (<) or equal (=) to 7.0% is recommended for most patients, however the goal may be higher or lower depending on age and/or other medical problems.   For a diagnosis of diabetes, A1c greater than (>) or equal(=) to 6.5% indicates diabetes; values between 5.7% and 6.4% may indicate an increased risk of developing diabetes.   01/27/2018 9.4 (H) <5.7 % Final     Comment:     For patients with diabetes, an A1c less than (<) or equal (=) to 7.0% is recommended for most patients, however the goal may be higher or lower depending on age and/or other medical problems.   For a diagnosis of diabetes, A1c greater than (>) or equal(=) to 6.5% indicates diabetes; values between 5.7% and 6.4% may indicate an increased risk of developing diabetes.     Cholesterol   Date Value Ref Range Status   09/19/2018 184 See Comment mg/dL Final     Comment:     The significance of total cholesterol depends on the values of LDL, HDL, triglycerides and the clinical context. A patient-provider discussion may be considered.       07/14/2018 165 See Comment mg/dL Final     Comment:     The significance of total cholesterol depends on the values of LDL, HDL, triglycerides and the clinical context. A patient-provider discussion may be considered.       01/27/2018 185 See Comment mg/dL Final     Comment: The significance of total cholesterol depends on the values of LDL, HDL, triglycerides and the clinical context. A patient-provider discussion may be considered.         Cholesterol,LDL,Calc   Date Value Ref Range Status   09/19/2018 66 <100 mg/dL Final     Comment:     If LDL value falls outside of the designated range AND if  included in any of the following categories, a  patient-provider discussion is recommended.     Statin therapy is recommended for individuals:  1. with clinical atherosclerotic cardiovascular disease     irrespective of LDL levels;  2. with LDL > or = 190 mg/dL;  3. with diabetes, aged 40-75 years, with LDL between 70 and     189 mg/dL;  4. without any of the above but who have LDL between 70 and     189 mg/dL and an estimated 96-EAVW risk of     atherosclerotic cardiovascular disease > or = 7.5%     (consider statin therapy if  estimated 10-year risk > or =     5.0%) (ACC/AHA 2013 Guidelines).   07/14/2018 47 <100 mg/dL Final     Comment:     If LDL value falls outside of the designated range AND if  included in any of the following categories, a  patient-provider discussion is recommended.     Statin therapy is recommended for individuals:  1. with clinical atherosclerotic cardiovascular disease     irrespective of LDL levels;  2. with LDL > or = 190 mg/dL;  3. with diabetes, aged 40-75 years, with LDL between 70 and     189 mg/dL;  4. without any of the above but who have LDL between 70 and     189 mg/dL and an estimated 16-XWRU risk of     atherosclerotic cardiovascular disease > or = 7.5%     (consider statin therapy if estimated 10-year risk > or =     5.0%) (ACC/AHA 2013 Guidelines).   05/29/2016 74 <100 mg/dL Final     Comment:     If LDL value falls outside of the designated range AND if  included in any of the following categories, a  patient-provider discussion is recommended.     Statin therapy is recommended for individuals: 1. with clinical atherosclerotic cardiovascular disease     irrespective of LDL levels;  2. with LDL > or = 190 mg/dL;  3. with diabetes, aged 40-75 years, with LDL between 70 and     189 mg/dL;  4. without any of the above but who have LDL between 70 and     189 mg/dL and an estimated 04-VWUJ risk of     atherosclerotic cardiovascular disease > or = 7.5%     (consider statin therapy if estimated 10-year risk > or =     5.0%) (ACC/AHA 2013 Guidelines).     Cholesterol, HDL   Date Value Ref Range Status   09/19/2018 105 >40 mg/dL Final     Comment:     If HDL cholesterol level falls outside of the designated  range, a patient-provider discussion is recommended   07/14/2018 104 >40 mg/dL Final     Comment:     If HDL cholesterol level falls outside of the designated  range, a patient-provider discussion is recommended   01/27/2018 99 >40 mg/dL Final     Comment:     If HDL cholesterol level falls outside of the designated  range, a patient-provider discussion is recommended     Triglycerides   Date Value Ref Range Status   09/19/2018 66 <150 mg/dL Final     Comment:     If Triglyceride level falls outside of the designated range,  a patient-provider discussion is recommended.     07/14/2018 70 <150 mg/dL Final     Comment:     If Triglyceride level falls outside of the designated range,  a patient-provider discussion is recommended.     01/27/2018 87 <150 mg/dL Final     Comment:     If Triglyceride level falls outside of the designated range,  a patient-provider discussion is recommended.        Creatinine   Date Value Ref Range Status   09/19/2018 0.84 0.60 - 1.30 mg/dL Final   81/19/1478 2.95 0.60 - 1.30 mg/dL Final   62/13/0865 7.84 0.60 - 1.30 mg/dL Final        PHQ-9 Results  Depression Screening (Patient Health Questionnaire PHQ) 08/18/2012 10/19/2014 10/19/2014 09/11/2015 12/24/2016 01/22/2017 01/27/2018  PHQ-2: Feeling down, depressed, or hopeless - No No No No No No PHQ-2: Little interest or pleassure in doing things - No No No No No No   Little interest or pleasure in doing things Not at all - - - - - -   Feeling down, depressed, or hopeless Not at all - - - - - -   Total Score 0 - - - - - -   Some recent data might be hidden       GAD-7 Results  No flowsheet data found.    DAST Results  No flowsheet data found.    Audit-C results  No flowsheet data found.      No follow-ups on file.  The above plan of care, diagnosis, orders, and follow-up were discussed with the patient.  Questions related to this recommended plan of care were answered.  Lonzo Cloud. Karolee Ohs, DO  10/02/2018    Author:  Lonzo Cloud. Yaasir Menken 10/02/2018 8:37 AM

## 2018-10-04 MED ORDER — LANTUS 100 UNIT/ML SC SOLN
SUBCUTANEOUS | 1 refills | 32.00000 days
Start: 2018-10-04 — End: ?

## 2018-10-05 MED ORDER — FUROSEMIDE 20 MG PO TABS
ORAL_TABLET | 0 refills | Status: AC
Start: 2018-10-05 — End: ?

## 2018-10-08 MED ORDER — INSULIN GLARGINE 100 UNIT/ML SC SOLN
1 refills | 32.00000 days | Status: AC
Start: 2018-10-08 — End: ?

## 2018-10-14 MED ORDER — INSULIN LISPRO 100 UNIT/ML SC SOLN
1 refills | 30.00000 days
Start: 2018-10-14 — End: ?

## 2018-10-15 ENCOUNTER — Ambulatory Visit: Payer: BLUE CROSS/BLUE SHIELD

## 2018-10-18 ENCOUNTER — Telehealth: Payer: BLUE CROSS/BLUE SHIELD

## 2018-10-18 MED ORDER — INSULIN GLARGINE 100 UNIT/ML SC SOLN
1 refills | 32.00000 days | Status: AC
Start: 2018-10-18 — End: ?

## 2018-10-18 NOTE — Telephone Encounter
Hi Ana,     Zoom Link sent.

## 2018-10-18 NOTE — Telephone Encounter
Spoke with patients daughter Wells Guiles re: blood sugar readings. She will provide readings through a mychart message.

## 2018-10-19 ENCOUNTER — Ambulatory Visit: Payer: BLUE CROSS/BLUE SHIELD

## 2018-10-19 ENCOUNTER — Telehealth: Payer: BLUE CROSS/BLUE SHIELD

## 2018-10-19 DIAGNOSIS — E10649 Type 1 diabetes mellitus with hypoglycemia without coma: Secondary | ICD-10-CM

## 2018-10-19 DIAGNOSIS — E1065 Type 1 diabetes mellitus with hyperglycemia: Secondary | ICD-10-CM

## 2018-10-19 NOTE — Interdisciplinary
REASON FOR VISIT:    Chad Watkins is a 73 y.o. male seen in the Endocrine clinic for diabetes education.  Reason for referral:  For T1DM per Family Medicine provider Dr. Karolee Ohs.       History of Present Illness:  As documented per Dr. Karolee Ohs:  He presents for his follow-up diabetic visit. He has type 1 diabetes mellitus. His disease course has been fluctuating. Hypoglycemia symptoms include confusion, dizziness, mood changes, nervousness/anxiousness, pallor, sleepiness, speech difficulty, sweats and tremors. Pertinent negatives for hypoglycemia include no headaches, hunger or seizures. Associated symptoms include fatigue and foot paresthesias. Pertinent negatives for diabetes include no blurred vision, no chest pain, no foot ulcerations, no polydipsia, no polyphagia, no polyuria, no visual change, no weakness and no weight loss. Hypoglycemia complications include blackouts, hospitalization, nocturnal hypoglycemia, required assistance and required glucagon injection. Diabetic symptom progression: FLUCTUATES. Diabetic complications include peripheral neuropathy and PVD. Pertinent negatives for diabetic complications include no autonomic neuropathy, CVA, heart disease, impotence, nephropathy or retinopathy. Risk factors for coronary artery disease include diabetes mellitus, male sex and stress. Current diabetic treatment includes insulin injections and oral agent (monotherapy) (POOR DIET, BOTH LONG ACTING AND SHORT ACTING INSULIN, PLUS METFORMIN). He is compliant with treatment most of the time (SOMETIMES DOES OWN THING WITH REGARD TO HUMALOG SPOTTING). He is currently taking insulin pre-breakfast, pre-lunch, pre-dinner and at bedtime (ACCUCHEK'S AT LEAST QAC/HS AND SOMETIMES 3 AM.  BUT SCHEDULE IS SHIFTED DUE TO WORKING LATE MORNING/NOON TO LATE NIGHT 10'ISH). Insulin injections are given by patient and relative (DAUGHTERS ACTIVE IN HIS CARE). His weight is stable. He is following a generally unhealthy diet. When asked about meal planning, he reported none (GENERALLY DOES KNOW HOW TO CARB COUNT BUT DOES NOT DO IT). He has not had a previous visit with a dietitian (NOT IN MANY YEARS.  VERY RESISTANT TO CHANGING DIET AND LIFESTYLE). He participates in exercise intermittently (VERY ACTIVE AT WORK). He monitors blood glucose at home 5+ x per day. Blood glucose monitoring compliance is good. His home blood glucose trend is fluctuating dramatically (SOME LOWS IN 30'S TO 50'S, SOME HIGHS IN 300'S). (ALL TIMES OF DAY CAN FLUCTUATE, VERY LITTLE CONSISTENCY DUE TO EATING PATTERNS, SOMETIMES NOT EATING, OTHER TIMES EATING A LOT) An ACE inhibitor/angiotensin II receptor blocker is being taken. He does not see a podiatrist.Eye exam is not current.   ???  ???    Interval History:  Seen by Dr. Karolee Ohs recently 09/19/2018 and see his detailed plan and assessment below:  DM type 1 with diabetic mixed hyperlipidemia (HCC/RAF)     3. DM (diabetes mellitus), type 1, uncontrolled w/neurologic complication (HCC/RAF)    4. DM (diabetes mellitus), type 1, uncontrolled, periph vascular complic (HCC/RAF)    5. Diabetic polyneuropathy associated with type 1 diabetes mellitus (HCC/RAF)    6. Peripheral vascular disease (HCC/RAF)    7. Edema leg    8. Healthcare maintenance    9. Uncontrolled type 1 diabetes mellitus without complication (HCC/RAF)    10. Preventative health care    ???  AFTER LAST VISIT, REGIMEN SHOULD HAVE BEEN LANTUS 5 QAM AND 3 QPM, BUT PT WENT TO 5 AND 5.  DIET PATTERN FLUCTUATES TREMENDOUSLY WITH WORK SCHEDULE.  IMPLORED WITH PATIENT TO TRY TO BE CONSISTENT AND TO WATCH CARBS.  THERE IS NO RHYME OR REASON I CAN FOLLOW AT THIS TIME OF WHAT PATTERN TO SUGGEST.  DID PUT A BOTTOM STOP OF NO HUMALOG IF SUGAR CHECK IS BELOW 100 -  PT DID A FEW TIMES BELIEVING WOULD EAT AND COVER 3 UNITS HE WOULD GIVE HIMSELF.  BUT TIMES IT DROPPED DOWN.  ALSO SOME 3 AM LOWS.  TRYING TO USE METFORMIN TO LEVEL OUT EATING PATTERN AND/OR INSULIN PATTERN.  FOR A PERIOD, I DID STOP THE LOWS BUT NOW GETTING THEM BACK.  WILL MOVE TO LANTUS 5 QAM AND 3 QPM, WATCH THE HUMALOG DOSING, AND CONTINUE METFORMIN.  METFORMIN XR HAS APPEARED TO CUT APPETITE DOWN.  DID TRY METFORMIN PLUS JUST HUMALOG SPOTTING BUT SUGAR CONSISTENT IN 400'S WITH THAT REGIMEN.  METFORMIN + JUST MORNING LANTUS 5 UNITS + HUMALOG SPOTTING ALSO INADEQUATE CONTROL SO ADDED NIGHTTIME DOSING.  WHEN FIRST MET ME, WAS JUST TAKING HIGH DOSES 15-20 UNITS LANTUS AT NIGHT AND SPOTTED HUMALOG ON HIS OWN MADE UP SCALE.  MAY TAKE AWAY NIGHT TIME LANTUS ALL TOGETHER AND INCREASE MORNING LANTUS WITH METFORMIN AND HUMALOG SPOTTING.  COME BACK IN 2-4 WEEKS FOR RECHECK.  IMPLORED TO SEE NUTRITION AND ENDO.    ???    Current Diabetes Medications:     Lantus 5 and 5  Metformin 500mg  qid  Humalog 4-5 units ac meals usually bid.    MDI Assessment if Applicable   []   Insulin Pen []  Insulin Vial and Syringe   []   Correct Storage of both Opened and unopened Insulin   []   Correct preparation of Insulin, correct injection technique, and adequate injection site rotation   []   Lipodystrophy Present   []   Correct Timing of Insulin Injections  Notes:   TIMING MAY BE DELAYED OF BOLUS AND SUSPECT HE IS INJECTING INTO SCARRED TISSUE   []   Already assessed the above at a previous visit   []   N/A      Past Medical History:       []  None     Past Medical History:   Diagnosis Date   ??? Diabetic neuropathy (HCC/RAF) 01/27/2018   ??? Diabetic peripheral vascular disease (HCC/RAF) 09/03/2017   ??? DM type 1 with diabetic mixed hyperlipidemia (HCC/RAF) 09/03/2017   ??? Edema leg 07/14/2018   ??? History of fracture of left ankle 12/01/2017   ??? Hypertension associated with diabetes (HCC/RAF) 09/03/2017   ??? Peripheral vascular disease (HCC/RAF) 09/03/2017   ??? Trigger thumb 01/27/2018   ??? Type 1 diabetes mellitus with diabetic neuropathy, unspecified (HCC/RAF) 09/03/2017 Diabetes Symptoms Yes No Details, if any   Blurry vision  []    []      Numbness/tingling in hands/feet   []    []      Polyuria  []    []       Polydipsia   []    []       Dry mouth  []    []      Nausea/vomiting  []    []      Nocturnal hunger  []    []      Nightmares  []    []      Hypoglycemia  []    []      Night Sweats  []   []     Anxiety/Depression  []   []         BP 118/56  ~ Pulse 69  ~ Temp 36.3 ???C (97.4 ???F) (Temporal)  ~ Wt 150 lb (68 kg)  ~ BMI 24.96 kg/m???   Wt Readings from Last 3 Encounters:   10/19/18 150 lb (68 kg)   09/19/18 150 lb (68 kg)   08/18/18 154 lb (69.9 kg)     Body mass index is 24.96 kg/m???.  No results for input(s): GLUCOSEPOC in the last 72 hours.      MEDICATIONS:   Current Outpatient Medications   Medication Sig   ??? acetaminophen-codeine (TYLENOL #3) 300-30 mg tablet TAKE 1 TABLET EVERY 4 HOURS AS NEEDED FOR PAIN   ??? AMLODIPINE-BENAZEPRIL 5-20 mg capsule TAKE 1 CAPSULE BY MOUTH TWO (2) TIMES DAILY.   ??? ciclopirox 8% solution Apply topically at bedtime Apply over nail and surrounding skin.Marland Kitchen   ??? FUROSEMIDE 20 mg tablet TOME UNA TABLETA TODOS LOS DIAS EN LA MANANA   ??? glucose blood test strip Use as instructed (Patient taking differently: three (3) times daily Use as instructed.)   ??? insulin glargine (LANTUS) 100 units/mL injection vial USE 5 UNITS EVERY MORNING AND 3 UNITS EVERY EVENING.   ??? insulin lispro, HumaLOG, (HUMALOG) 100 unit/mL injection vial 3 units sq qac plus insulin sliding scale patient has.  Hold for sugar <100.   ??? insulin lispro, HumaLOG, (HUMALOG) 100 unit/mL injection vial 3 UNITS EACH MEAL PLUS AMOUNT DETERMINED BY MD'S SLIDING SCALE; HOLD FOR SUGAR UNDER 100.   ??? insulin syringe needle U-100 (BD INSULIN SYRINGE U/F) 31G X 5/16'' 0.3 mL syringe Up to 7 times a day.   ??? metFORMIN (GLUCOPHAGE XR) 500 mg PO ER 24 hr tablet Take 2 tablets (1,000 mg total) by mouth two (2) times daily with meals. (Patient taking differently: Take 500 mg by mouth 2 Tablets QID.) ??? ONETOUCH ULTRA BLUE test strip USE 1 TEST STRIP TESTING 5 TIMES A DAY.   ??? sildenafil 100 mg tablet Take 1 tablet (100 mg total) by mouth daily as needed for Erectile Dysfunction.   ??? simvastatin (ZOCOR) 20 mg tablet TOME UNA TABLETA DIARIAMENTE   ??? FLUZONE HIGH-DOSE 0.5 ML syringe    ??? gatifloxacin 0.5% ophthalmic solution    ??? glucagon (BAQSIMI TWO PACK) 3 mg/dose nasal powder 1 actuation (3 mg total) by Left Nare route every fifteen (15) minutes as needed.   ??? LOTEMAX SM 0.38 % GEL    ??? PROLENSA 0.07 % SOLN    ??? SHINGRIX 50 MCG/0.5ML injection    ??? SIMVASTATIN 20 mg tablet TOME UNA TABLETA TODOS LOS DIAS AL ACOSTARSE (Patient not taking: Reported on 10/19/2018)     No current facility-administered medications for this visit.         ALLERGY:  No Known Allergies    LABS  Lab Results   Component Value Date    HGBA1C 7.7 (H) 09/19/2018               Lab Results   Component Value Date    WBC 6.25 09/19/2018    HGB 13.0 (L) 09/19/2018    HCT 39.2 09/19/2018    PLT 294 09/19/2018    NA 135 09/19/2018    K 5.1 09/19/2018    CL 94 (L) 09/19/2018    CO2 24 09/19/2018    BUN 14 09/19/2018    CREAT 0.84 09/19/2018    GLUCOSE 169 (H) 09/19/2018     Lab Results   Component Value Date    ALT 21 09/19/2018    AST 42 09/19/2018    BILITOT 0.4 09/19/2018    ALKPHOS 62 09/19/2018    ALBUMIN 4.8 09/19/2018     Lab Results   Component Value Date    CHOL 184 09/19/2018    CHOLHDL 105 09/19/2018    CHOLDLQ 80 01/27/2018    TRIGLY 66 09/19/2018     Lab Results  Component Value Date    Lavenia Atlas Negative 09/19/2018     Lab Results   Component Value Date    ALBCREATUR  09/19/2018      Comment:      Unable to calculate. Test result is???below detection???limit.     Lab Results   Component Value Date    HGBA1C 7.7 (H) 09/19/2018    HGBA1C 7.5 (H) 07/14/2018    HGBA1C 9.4 (H) 01/27/2018         SELF-MANAGEMENT     DSM TOPICS  See Diabetes Self Management Flowsheet    BLOOD GLUCOSE AVERAGE:  METER DATA  [x]  CGM GLUCOSE VALUES:   [] Dexcom  [] Medtronic  [] Libre  [phone]    54 [phone] 64-  10/18/2018     Impression: Poor Diabetes Control Mostly related to poor diabetes self management and personal compliance in not following MD provided instructions.    Interventions: Patient's daughter logged on with the patient for today's video conference.   She provided the above blood sugars which were also emailed with food records.   Control is noted as poor and variable.   Discussions were in Spanish today;  Patient's native language.    My first suggestion was obtaining a CGM.    Dexcom G6 would be the best fit due to the alerts including a low alert, urgent low soon alert, and a high alert.   Patient's daughter can be added as a follower and be alerted for the low blood glucose emergencies.   This was discussed in detail for a long period of time and daughter had heard this suggestion before from Endo Provider, Dr. Beaulah Corin who retired this year.  Patient qualifies under Medicare requirements in that he is regularly testing 4 times a day and injects insulin 3-4 times a day.    Diet is not carb controlled and his fixed dosing is not matching his carb intake contributing to the variability.   He also snacks on fruit without bolus however he believes that the fruit snacks prevent future lows.   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   Attempted to comment and give dietary advice , Reply in Spanish, ''Yo hago las cosas como yo las hago con mis ideas.''   Patient was kind during the visit however the statement about ''Doing things my own way '' was repeated throughout the visit.      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    This can explain the hypoglycemia hours after meal time insulin.   Patient's daughter did verify that this was discussed in the past;  Patient repeated his phrase about refusing to change his methods despite my advice today.    Discussed the dangers of hypoglycemia that could lead to LOC.   Suspect patient will not change his self management behaviors based on his statements today and his daughter did verify that this is a struggle.   Values could stabilize if he is convinced to inject into new tissue and he needs Dexcom CGM so that he be warned of severe lows.       Return visit with Dr. Karolee Ohs tomorrow.  Patient's daughter to keep in touch with me for any further needs.   If they wish to re estabish care  With Endo since Beaulah Corin retired, recommended Dr. Milas Kocher who is fluent in Spanish for future  reference.   She is located at the [facility] location.       See DSM flowsheet for other diabetes self management topics discussed/reviewed for today's visit.   Patient verbalized understanding of the topics discussed below:        Understanding of diabetes disease process and management      Most Recent Value   Time spent on DSME and DSMES  60 minutes      Develping strategies to promote healthy coping and behavior change      Most Recent Value   Curriculum and/or handouts provided for:  See Patient Instructions Section in Epic   Behavioral Change Plan:  Make the following changes:        The below recommendations were discussed with the patient.  All the patient 's questions were answered to his/her satisfaction.    No follow-ups on file.      Plan:    Patient Instructions   Thank you for checking in today.    It is very important that insulin injections be switched into using good tissue instead of scarred tissue which could be causing severe highs after meals only to lead to low blood sugar emergencies hours later.    The diet needs to be fixed if willing to make changes;  However only if willing to do so as discussed.    Please discuss with your Provider Dexcom G6.    Feel free to keep in touch Author:  Mabeline Caras RN, CDE 10/19/2018 10:11 PM

## 2018-10-20 ENCOUNTER — Ambulatory Visit: Payer: BLUE CROSS/BLUE SHIELD

## 2018-10-20 DIAGNOSIS — Z23 Encounter for immunization: Secondary | ICD-10-CM

## 2018-10-20 DIAGNOSIS — E1042 Type 1 diabetes mellitus with diabetic polyneuropathy: Secondary | ICD-10-CM

## 2018-10-20 NOTE — Patient Instructions
Thank you for checking in today.    It is very important that insulin injections be switched into using good tissue instead of scarred tissue which could be causing severe highs after meals only to lead to low blood sugar emergencies hours later.    The diet needs to be fixed if willing to make changes;  However only if willing to do so as discussed.    Please discuss with your Provider Dexcom G6.    Feel free to keep in touch

## 2018-10-31 NOTE — Progress Notes
PATIENT: Chad Watkins  MRN: [id number]  DOB: 1945/02/21  DATE OF SERVICE: 10/20/2018    CHIEF COMPLAINT:   Chief Complaint   Patient presents with   ? Follow-up     Patient here for follow up on Diabetes   ? Hypertension   ? Hyperlipidemia   ? Diabetes     still inconsistent readings due to his doing his own thing.  little better understanding with seeing nutritionist and now ok with seeing endo     Hypertension   Pertinent negatives include no chest pain, headaches, neck pain, palpitations or shortness of breath.   Hyperlipidemia   Pertinent negatives include no chest pain, myalgias or shortness of breath.   Diabetes   Pertinent negatives for hypoglycemia include no confusion, dizziness, headaches, nervousness/anxiousness, pallor, seizures, speech difficulty or tremors. Pertinent negatives for diabetes include no chest pain, no fatigue, no polydipsia, no polyphagia, no polyuria and no weakness.         Past Medical History:   Diagnosis Date   ? Diabetic neuropathy 01/27/2018   ? Diabetic peripheral vascular disease 09/03/2017   ? DM type 1 with diabetic mixed hyperlipidemia (HCC/RAF) 09/03/2017   ? Edema leg 07/14/2018   ? History of fracture of left ankle 12/01/2017   ? Hypertension associated with diabetes (HCC/RAF) 09/03/2017   ? Peripheral vascular disease (HCC/RAF) 09/03/2017   ? Trigger thumb 01/27/2018   ? Type 1 diabetes mellitus with diabetic neuropathy, unspecified (HCC/RAF) 09/03/2017       No past surgical history on file.    Social History     Tobacco Use   Smoking Status Never Smoker   Smokeless Tobacco Never Used       No family history on file.    Social History     Substance and Sexual Activity   Alcohol Use Yes   ? Alcohol/week: 4.2 oz   ? Types: 7 Standard drinks or equivalent per week       Social History     Substance and Sexual Activity   Drug Use Not on file       No Known Allergies    Patient Active Problem List   Diagnosis   ? Bursitis   ? Skin cancer   ? Hyperlipidemia   ? Palpitation ??? Need for vaccination   ??? Healthcare maintenance   ??? Hypertension, well controlled   ??? Abnormal EKG   ??? Left ankle pain   ??? Hypertension associated with diabetes (HCC/RAF)   ??? DM type 1 with diabetic mixed hyperlipidemia (HCC/RAF)   ??? DM (diabetes mellitus), type 1, uncontrolled, periph vascular complic   ??? Peripheral vascular disease (HCC/RAF)   ??? DM (diabetes mellitus), type 1, uncontrolled w/neurologic complication (HCC/RAF)   ??? History of fracture of left ankle   ??? Diabetic neuropathy   ??? Trigger thumb   ??? Edema leg         Current Outpatient Medications:   ???  AMLODIPINE-BENAZEPRIL 5-20 mg capsule, TAKE 1 CAPSULE BY MOUTH TWO (2) TIMES DAILY., Disp: 180 capsule, Rfl: 1  ???  FUROSEMIDE 20 mg tablet, TOME UNA TABLETA TODOS LOS DIAS EN LA MANANA, Disp: 90 tablet, Rfl: 0  ???  insulin glargine (LANTUS) 100 units/mL injection vial, USE 5 UNITS EVERY MORNING AND 3 UNITS EVERY EVENING., Disp: 90 mL, Rfl: 1  ???  insulin lispro, HumaLOG, (HUMALOG) 100 unit/mL injection vial, 3 units sq qac plus insulin sliding scale patient has.  Hold for sugar <100., Disp: 30  mL, Rfl: 2  ???  insulin syringe needle U-100 (BD INSULIN SYRINGE U/F) 31G X 5/16'' 0.3 mL syringe, Up to 7 times a day., Disp: 700 each, Rfl: 6  ???  metFORMIN (GLUCOPHAGE XR) 500 mg PO ER 24 hr tablet, Take 2 tablets (1,000 mg total) by mouth two (2) times daily with meals. (Patient taking differently: Take 500 mg by mouth 2 Tablets 2 times a day.), Disp: 360 tablet, Rfl: 3  ???  ONETOUCH ULTRA BLUE test strip, USE 1 TEST STRIP TESTING 5 TIMES A DAY., Disp: 500 strip, Rfl: 3  ???  sildenafil 100 mg tablet, Take 1 tablet (100 mg total) by mouth daily as needed for Erectile Dysfunction., Disp: 10 tablet, Rfl: 10  ???  simvastatin (ZOCOR) 20 mg tablet, TOME UNA TABLETA DIARIAMENTE, Disp: 90 tablet, Rfl: 3  ???  acetaminophen-codeine (TYLENOL #3) 300-30 mg tablet, TAKE 1 TABLET EVERY 4 HOURS AS NEEDED FOR PAIN, Disp: , Rfl: 0 ?  ciclopirox 8% solution, Apply topically at bedtime Apply over nail and surrounding skin.. (Patient not taking: Reported on 10/20/2018.), Disp: 1 bottle, Rfl: 3  ?  FLUZONE HIGH-DOSE 0.5 ML syringe, , Disp: , Rfl:   ?  gatifloxacin 0.5% ophthalmic solution, , Disp: , Rfl:   ?  glucagon (BAQSIMI TWO PACK) 3 mg/dose nasal powder, 1 actuation (3 mg total) by Left Nare route every fifteen (15) minutes as needed., Disp: 3 each, Rfl: 1  ?  glucose blood test strip, Use as instructed (Patient taking differently: three (3) times daily Use as instructed.), Disp: 100 each, Rfl: 12  ?  insulin lispro, HumaLOG, (HUMALOG) 100 unit/mL injection vial, 3 UNITS EACH MEAL PLUS AMOUNT DETERMINED BY MD'S SLIDING SCALE; HOLD FOR SUGAR UNDER 100., Disp: 30 mL, Rfl: 1  ?  LOTEMAX SM 0.38 % GEL, , Disp: , Rfl:   ?  PROLENSA 0.07 % SOLN, , Disp: , Rfl:   ?  SHINGRIX 50 MCG/0.5ML injection, , Disp: , Rfl:   ?  SIMVASTATIN 20 mg tablet, TOME UNA TABLETA TODOS LOS DIAS AL ACOSTARSE (Patient not taking: Reported on 10/19/2018), Disp: 90 tablet, Rfl: 3    Health Maintenance   Topic Date Due   ? Advance Directive  05/19/2009   ? Diabetes: EYE EXAM  11/03/2018   ? Annual Preventive Wellness Visit  01/28/2019   ? Diabetes: HGB A1C  03/19/2019   ? Diabetes: NEPHROPATHY MONITORING  09/23/2019   ? Tdap/Td Vaccine (2 - Td) 07/19/2022   ? Colon Ca Screening: COLONOSCOPY  03/02/2023   ? Influenza Vaccine  Completed   ? Pneumococcal Vaccine  Completed   ? Hepatitis C Screening  Completed   ? Shingles (Shingrix) Vaccine  Completed   ? Statin prescribed for ASCVD Prevention or Treatment  Completed       Patient Care Team:  Trena Platt., DO as PCP - General (Family Medicine)      Subjective:      Chad Watkins is a 73 y.o. male.    Review of Systems   Constitutional: Negative.  Negative for activity change, appetite change, chills, diaphoresis, fatigue, fever and unexpected weight change. HENT: Negative.  Negative for congestion, dental problem, drooling, ear discharge, ear pain, facial swelling, hearing loss, mouth sores, nosebleeds, postnasal drip, rhinorrhea, sinus pressure, sinus pain, sneezing, sore throat, tinnitus, trouble swallowing and voice change.    Eyes: Negative.  Negative for photophobia, pain, discharge, redness, itching and visual disturbance.   Respiratory: Negative.  Negative for  apnea, cough, choking, chest tightness, shortness of breath, wheezing and stridor.    Cardiovascular: Negative.  Negative for chest pain, palpitations and leg swelling.   Gastrointestinal: Negative.  Negative for abdominal distention, abdominal pain, anal bleeding, blood in stool, constipation, diarrhea, nausea, rectal pain and vomiting.   Endocrine: Negative.  Negative for cold intolerance, heat intolerance, polydipsia, polyphagia and polyuria.   Genitourinary: Negative.  Negative for decreased urine volume, difficulty urinating, discharge, dysuria, enuresis, flank pain, frequency, genital sores, hematuria, penile pain, penile swelling, scrotal swelling, testicular pain and urgency.   Musculoskeletal: Negative.  Negative for arthralgias, back pain, gait problem, joint swelling, myalgias, neck pain and neck stiffness.   Skin: Negative.  Negative for color change, pallor, rash and wound.   Allergic/Immunologic: Negative.  Negative for environmental allergies, food allergies and immunocompromised state.   Neurological: Negative.  Negative for dizziness, tremors, seizures, syncope, facial asymmetry, speech difficulty, weakness, light-headedness, numbness and headaches.   Hematological: Negative.  Negative for adenopathy. Does not bruise/bleed easily.   Psychiatric/Behavioral: Negative.  Negative for agitation, behavioral problems, confusion, decreased concentration, dysphoric mood, hallucinations, self-injury, sleep disturbance and suicidal ideas. The patient is not nervous/anxious and is not hyperactive. All other systems reviewed and are negative.        Objective:      Physical Exam  Vitals signs reviewed.   Constitutional:       General: He is not in acute distress.     Appearance: Normal appearance. He is well-developed and normal weight. He is not ill-appearing, toxic-appearing or diaphoretic.      Comments: BP 91/45 Comment: Left arm ~ Pulse 71  ~ Temp 36.4 ???C (97.6 ???F) (Forehead)  ~ Wt 154 lb (69.9 kg)  ~ BMI 25.63 kg/m???      HENT:      Head: Normocephalic and atraumatic.      Right Ear: External ear normal.      Left Ear: External ear normal.      Nose: Nose normal.   Eyes:      Conjunctiva/sclera: Conjunctivae normal.   Neck:      Musculoskeletal: Normal range of motion and neck supple. No neck rigidity or muscular tenderness.   Pulmonary:      Effort: Pulmonary effort is normal.   Lymphadenopathy:      Cervical: No cervical adenopathy.   Skin:     General: Skin is warm and dry.   Neurological:      General: No focal deficit present.      Mental Status: He is alert and oriented to person, place, and time.   Psychiatric:         Mood and Affect: Mood normal.         Behavior: Behavior normal.         Thought Content: Thought content normal.         Judgment: Judgment normal.             ASSESSMENT AND PLAN     1. Hypertension associated with diabetes (HCC/RAF)    2. Need for immunization against influenza    3. DM type 1 with diabetic mixed hyperlipidemia (HCC/RAF)    4. DM (diabetes mellitus), type 1, uncontrolled w/neurologic complication (HCC/RAF)    5. Diabetic polyneuropathy associated with type 1 diabetes mellitus (HCC/RAF)    6. DM (diabetes mellitus), type 1, uncontrolled, periph vascular complic      Back to reporting some low sugars, will not change dosing at this time but get  to endo and see if can get CGM - body is too used to hypoglycemic episodes that does not warn him about it.  Discussed more about the sliding scale, when to hold insulin, etc...   More consistent diet. Update flu shot, get to optho for eye exam.  Each time preach and reiterate consistency of diet and insulin usage.      Orders Placed This Encounter   ??? Influenza vaccine IM;   PF - (Ambulatory Protocol - Flu Vaccine Inactivated Protocol)   ??? Influenza vaccine IM;   PF   ??? Referral to Ophthalmology   ??? Referral to Endocrinology       Wt Readings from Last 5 Encounters:   10/20/18 154 lb (69.9 kg)   10/19/18 150 lb (68 kg)   09/19/18 150 lb (68 kg)   08/18/18 154 lb (69.9 kg)   08/04/18 151 lb (68.5 kg)     BP Readings from Last 5 Encounters:   10/20/18 91/45   10/19/18 118/56   09/19/18 119/72   08/18/18 107/50   08/04/18 130/61       Hgb A1c - HPLC   Date Value Ref Range Status   09/19/2018 7.7 (H) <5.7 % Final     Comment:     For patients with diabetes, an A1c less than (<) or equal (=) to 7.0% is recommended for most patients, however the goal may be higher or lower depending on age and/or other medical problems.   For a diagnosis of diabetes, A1c greater than (>) or equal(=) to 6.5% indicates diabetes; values between 5.7% and 6.4% may indicate an increased risk of developing diabetes.   07/14/2018 7.5 (H) <5.7 % Final     Comment:     For patients with diabetes, an A1c less than (<) or equal (=) to 7.0% is recommended for most patients, however the goal may be higher or lower depending on age and/or other medical problems.   For a diagnosis of diabetes, A1c greater than (>) or equal(=) to 6.5% indicates diabetes; values between 5.7% and 6.4% may indicate an increased risk of developing diabetes.   01/27/2018 9.4 (H) <5.7 % Final     Comment:     For patients with diabetes, an A1c less than (<) or equal (=) to 7.0% is recommended for most patients, however the goal may be higher or lower depending on age and/or other medical problems.   For a diagnosis of diabetes, A1c greater than (>) or equal(=) to 6.5% indicates diabetes; values between 5.7% and 6.4% may indicate an increased risk of developing diabetes. Cholesterol   Date Value Ref Range Status   09/19/2018 184 See Comment mg/dL Final     Comment:     The significance of total cholesterol depends on the values of LDL, HDL, triglycerides and the clinical context. A patient-provider discussion may be considered.       07/14/2018 165 See Comment mg/dL Final     Comment:     The significance of total cholesterol depends on the values of LDL, HDL, triglycerides and the clinical context. A patient-provider discussion may be considered.       01/27/2018 185 See Comment mg/dL Final     Comment:     The significance of total cholesterol depends on the values of LDL, HDL, triglycerides and the clinical context. A patient-provider discussion may be considered.         Cholesterol,LDL,Calc   Date Value Ref Range Status   09/19/2018  66 <100 mg/dL Final     Comment:     If LDL value falls outside of the designated range AND if  included in any of the following categories, a  patient-provider discussion is recommended.     Statin therapy is recommended for individuals:  1. with clinical atherosclerotic cardiovascular disease     irrespective of LDL levels;  2. with LDL > or = 190 mg/dL;  3. with diabetes, aged 40-75 years, with LDL between 70 and     189 mg/dL;  4. without any of the above but who have LDL between 70 and     189 mg/dL and an estimated 16-XWRU risk of     atherosclerotic cardiovascular disease > or = 7.5%     (consider statin therapy if estimated 10-year risk > or =     5.0%) (ACC/AHA 2013 Guidelines).   07/14/2018 47 <100 mg/dL Final     Comment:     If LDL value falls outside of the designated range AND if  included in any of the following categories, a  patient-provider discussion is recommended.     Statin therapy is recommended for individuals:  1. with clinical atherosclerotic cardiovascular disease     irrespective of LDL levels;  2. with LDL > or = 190 mg/dL;  3. with diabetes, aged 40-75 years, with LDL between 70 and     189 mg/dL; 4. without any of the above but who have LDL between 70 and     189 mg/dL and an estimated 04-VWUJ risk of     atherosclerotic cardiovascular disease > or = 7.5%     (consider statin therapy if estimated 10-year risk > or =     5.0%) (ACC/AHA 2013 Guidelines).   05/29/2016 74 <100 mg/dL Final     Comment:     If LDL value falls outside of the designated range AND if  included in any of the following categories, a  patient-provider discussion is recommended.     Statin therapy is recommended for individuals:  1. with clinical atherosclerotic cardiovascular disease     irrespective of LDL levels;  2. with LDL > or = 190 mg/dL;  3. with diabetes, aged 40-75 years, with LDL between 70 and     189 mg/dL;  4. without any of the above but who have LDL between 70 and     189 mg/dL and an estimated 81-XBJY risk of     atherosclerotic cardiovascular disease > or = 7.5%     (consider statin therapy if estimated 10-year risk > or =     5.0%) (ACC/AHA 2013 Guidelines).     Cholesterol, HDL   Date Value Ref Range Status   09/19/2018 105 >40 mg/dL Final     Comment:     If HDL cholesterol level falls outside of the designated  range, a patient-provider discussion is recommended   07/14/2018 104 >40 mg/dL Final     Comment:     If HDL cholesterol level falls outside of the designated  range, a patient-provider discussion is recommended   01/27/2018 99 >40 mg/dL Final     Comment:     If HDL cholesterol level falls outside of the designated  range, a patient-provider discussion is recommended     Triglycerides   Date Value Ref Range Status   09/19/2018 66 <150 mg/dL Final     Comment:     If Triglyceride level falls outside of the designated range,  a  patient-provider discussion is recommended.     07/14/2018 70 <150 mg/dL Final     Comment:     If Triglyceride level falls outside of the designated range,  a patient-provider discussion is recommended.     01/27/2018 87 <150 mg/dL Final     Comment: If Triglyceride level falls outside of the designated range,  a patient-provider discussion is recommended.        Creatinine   Date Value Ref Range Status   09/19/2018 0.84 0.60 - 1.30 mg/dL Final   16/10/9602 5.40 0.60 - 1.30 mg/dL Final   98/11/9145 8.29 0.60 - 1.30 mg/dL Final        PHQ-9 Results  Depression Screening (Patient Health Questionnaire PHQ) 08/18/2012 10/19/2014 10/19/2014 09/11/2015 12/24/2016 01/22/2017 01/27/2018   PHQ-2: Feeling down, depressed, or hopeless - No No No No No No   PHQ-2: Little interest or pleassure in doing things - No No No No No No   Little interest or pleasure in doing things Not at all - - - - - -   Feeling down, depressed, or hopeless Not at all - - - - - -   Total Score 0 - - - - - -   Some recent data might be hidden       GAD-7 Results  No flowsheet data found.    DAST Results  No flowsheet data found.    Audit-C results  No flowsheet data found.      No follow-ups on file.  The above plan of care, diagnosis, orders, and follow-up were discussed with the patient.  Questions related to this recommended plan of care were answered.  Lonzo Cloud. Karolee Ohs, DO  10/30/2018    Author:  Lonzo Cloud. Leyan Branden 10/30/2018 5:07 PM

## 2018-11-10 ENCOUNTER — Ambulatory Visit: Payer: BLUE CROSS/BLUE SHIELD

## 2018-11-23 ENCOUNTER — Ambulatory Visit: Payer: BLUE CROSS/BLUE SHIELD

## 2018-11-23 DIAGNOSIS — E1065 Type 1 diabetes mellitus with hyperglycemia: Secondary | ICD-10-CM

## 2018-11-23 DIAGNOSIS — E782 Mixed hyperlipidemia: Secondary | ICD-10-CM

## 2018-11-23 DIAGNOSIS — H538 Other visual disturbances: Secondary | ICD-10-CM

## 2018-11-23 NOTE — Progress Notes
PATIENT: Chad Watkins  MRN: [id number]  DOB: Apr 19, 1945  DATE OF SERVICE: 11/23/2018    REFERRING PRACTITIONER: Trena Platt., DO   PRIMARY CARE PROVIDER: Trena Platt., DO    Reason for Consultation: Type 1 Diabetes Mellitus    Subjective:     History of Present Illness  Chad Watkins is a 73 y.o. male with type 1 diabetes mellitus. The patient presents for consultation to the [facility].       Type 1 diabetes was diagnosed in 1989, and treated with insulin then.     He denies hx of DKA, but has had ''comas'' attributed to hypoglycemia that required paramedic visits, twice in July 2019.      Patient was previously seen by Dr. Earlene Plater.   At the last visit patient was instructed to:  ''  INSTRUCTIONS:    -Continue multiple dose insulin treatment of diabetes with Lantus 12 units q hs and Humalog per carbs and glucose level:  Humalog 1 unit per 10 gms of carbohydrate (sugars and starches), PLUS 1 unit extra if premeal glucose is over 150 and 2 units extra if over 200,  3 units extra if over 250, or  4 units extra if over 300.               -See CDE/RN Rayford Halsted ASAP to learn to adjust insulin based on carbs and premeal glucose.    -High frequency glucose monitoring, at least before every meal and at bedtime, 4 X daily UNTIL you get a Dexcom cgm. Ordered.   -Hypoglycemia protocol reviewed. ''Rule of 15'' handout given also. Carry glucose tabs at all times.   -Check glucose prior to driving. Glucose must be > 100 mg/dl. Reviewed risks related to driving with hypoglycemia   -Glucagon reviewed.  Baqsimi (glucagon) nasal spray if possible.  Ordered.  Waiting for coupon.    -Annual ophthalmic exam due.  Referred.  Tried to get remote Optos exam but unable.  CaLL for appt. ''    Saw Joyce Gross,     Reports working from 1 pm to 10 pm.  He works at the [employer].     Yesterday, his blood sugar when he woke up and his blood sugar was 256, he gave himself Humalog 5 units.  He ate posole. At 6:17 pm his blood sugar was 259, bean burrito with flour tortilla, he administered Humalog 4 units.   At 10:19 pm, he had 2 tacos carne asada he administered Humalog 3 units.   Reports that he has to have a blood sugar of 200 mg/dL at bedtime otherwise his blood sugar will drop overnight.     If his blood sugar is 201 and not going to eat, he will not administer extra insulin.   If his blood sugar is 250 mg/d during the day and not going to eat, he will administer 2 units.   Roughly he says he administers ISR 1:100 for blood sugars >100    Reports that he had low blood sugars in the past due to too much insulin and not enough carbohydrates.     Patient is injecting insulin 4x daily.   Patient is checking blood sugars >4x daily.     Current DM Medications  Lantus 5 units BID  Humalog ICR 1:10 with meals  Humalog ISR 1:100 for BG >100      Glucose Review:  Data from pt recall:  Fasting 180 mg/dl today, usually in  100s, <200.   220 in afternoon.   200s before L and 100 before D    Frequency of Checks: 2-4/day    Exercise:   Walks. On his feet 8 hrs at work all day.     Nutrition:  B/brunch 10 am at home  Snacks at work  Lunch at work 5 pm   Sara Lee 10-10:30       Complications & Associated Co-morbidities Yes No Comments/Treatment to Date   Retinopathy  []    [x]      Nephropathy  []    [x]      Neuropathy  []    [x]      Gastroparesis  []   [x]     CAD  []    [x]      Stroke  []    [x]      PVD  []   [x]     Sleep Apnea  [x]    [x]   Not sure. Poor sleep?       Routine Maintenance Yes No Comments/Last Performed   ACE-I  [x]    []      Statin  [x]    []      Retinal exam  []    [x]   Ordered today    Foot exam  [x]    []      Flu shot  [x]    []   ?       Past Medical History  Past Medical History:   Diagnosis Date   ? Diabetic neuropathy 01/27/2018   ? Diabetic peripheral vascular disease 09/03/2017   ? DM type 1 with diabetic mixed hyperlipidemia (HCC/RAF) 09/03/2017   ? Edema leg 07/14/2018   ? History of fracture of left ankle 12/01/2017 ? Hypertension associated with diabetes (HCC/RAF) 09/03/2017   ? Peripheral vascular disease (HCC/RAF) 09/03/2017   ? Trigger thumb 01/27/2018   ? Type 1 diabetes mellitus with diabetic neuropathy, unspecified (HCC/RAF) 09/03/2017       Past Surgical History  No past surgical history on file.    Medications  Current Outpatient Medications   Medication Sig   ? acetaminophen-codeine (TYLENOL #3) 300-30 mg tablet TAKE 1 TABLET EVERY 4 HOURS AS NEEDED FOR PAIN   ? AMLODIPINE-BENAZEPRIL 5-20 mg capsule TAKE 1 CAPSULE BY MOUTH TWO (2) TIMES DAILY.   ? FLUZONE HIGH-DOSE 0.5 ML syringe    ? FUROSEMIDE 20 mg tablet TOME UNA TABLETA TODOS LOS DIAS EN LA MANANA   ? gatifloxacin 0.5% ophthalmic solution    ? glucagon (BAQSIMI TWO PACK) 3 mg/dose nasal powder 1 actuation (3 mg total) by Left Nare route every fifteen (15) minutes as needed.   ? glucose blood test strip Use as instructed (Patient taking differently: three (3) times daily Use as instructed.)   ? insulin glargine (LANTUS) 100 units/mL injection vial USE 5 UNITS EVERY MORNING AND 3 UNITS EVERY EVENING.   ? insulin lispro, HumaLOG, (HUMALOG) 100 unit/mL injection vial 3 units sq qac plus insulin sliding scale patient has.  Hold for sugar <100.   ? insulin lispro, HumaLOG, (HUMALOG) 100 unit/mL injection vial 3 UNITS EACH MEAL PLUS AMOUNT DETERMINED BY MD'S SLIDING SCALE; HOLD FOR SUGAR UNDER 100.   ? insulin syringe needle U-100 (BD INSULIN SYRINGE U/F) 31G X 5/16'' 0.3 mL syringe Up to 7 times a day.   ? LOTEMAX SM 0.38 % GEL    ? metFORMIN (GLUCOPHAGE XR) 500 mg PO ER 24 hr tablet Take 2 tablets (1,000 mg total) by mouth two (2) times daily with meals. (Patient taking differently: Take 500 mg by  mouth 2 Tablets 2 times a day.)   ? ONETOUCH ULTRA BLUE test strip USE 1 TEST STRIP TESTING 5 TIMES A DAY.   ? PROLENSA 0.07 % SOLN    ? SHINGRIX 50 MCG/0.5ML injection    ? sildenafil 100 mg tablet Take 1 tablet (100 mg total) by mouth daily as needed for Erectile Dysfunction. ? simvastatin (ZOCOR) 20 mg tablet TOME UNA TABLETA DIARIAMENTE   ? ciclopirox 8% solution Apply topically at bedtime Apply over nail and surrounding skin.. (Patient not taking: Reported on 10/20/2018.)   ? SIMVASTATIN 20 mg tablet TOME UNA TABLETA TODOS LOS DIAS AL ACOSTARSE (Patient not taking: Reported on 10/19/2018)     No current facility-administered medications for this visit.      Prior to Admission medications    Medication Sig Start Date End Date Taking? Authorizing Provider   acetaminophen-codeine (TYLENOL #3) 300-30 mg tablet TAKE 1 TABLET EVERY 4 HOURS AS NEEDED FOR PAIN 11/18/16  Yes [provider]   BD INSULIN SYRINGE U/F 31G X 5/16'' 0.3 ML syringe INJECT 1 EACH UNDER THE SKIN FIVE (5) TIMES DAILY. 10/08/17  Yes Hallis, Raouf G., MD   CIALIS 5 MG tablet TAKE 1 TABLET (5 MG TOTAL) BY MOUTH DAILY. 09/14/14  Yes Hallis, Raouf G., MD   ciprofloxacin 0.3% ophthalmic solution 1 drop qid both eyes. 11/21/14  Yes Hallis, Raouf G., MD   glucose blood test strip Use as instructed 02/05/12  Yes Hallis, Raouf G., MD   HUMALOG 100 unit/mL injection vial USE 6 UNITS SUBCUTANEOUSLY 3 TIMES DAILY BEFORE MEALS. 10/08/17  Yes Hallis, Raouf G., MD   LANTUS 100 unit/mL injection vial INJECT 0.2 MLS (20 UNITS TOTAL) UNDER THE SKIN AT BEDTIME.. 07/27/17  Yes Hallis, Raouf G., MD   LISINOPRIL 20 mg tablet TAKE 1 TABLET (20 MG TOTAL) BY MOUTH DAILY.. 12/30/16  Yes Hallis, Raouf G., MD   metFORMIN (GLUCOPHAGE XR) 500 mg PO ER 24 hr tablet Take 2 tablets (1,000 mg total) by mouth two (2) times daily with meals. 09/05/17 09/05/18 Yes Gregor, Scott M., DO   ONETOUCH ULTRA BLUE test strip USE 1 TEST STRIP TESTING 5 TIMES A DAY. 10/05/16  Yes Hallis, Raouf G., MD   SILDENAFIL 100 mg tablet TAKE 1 TABLET (100 MG TOTAL) BY MOUTH AS NEEDED FOR FOR ERECTILE DYSFUNCTION. 11/18/16  Yes Hallis, Raouf G., MD   simvastatin (ZOCOR) 20 mg tablet TOME UNA TABLETA DIARIAMENTE 03/31/13  Yes Hallis, Raouf G., MD SIMVASTATIN 20 mg tablet TOME UNA TABLETA TODOS LOS DIAS AL ACOSTARSE 09/27/17  Yes Hallis, Raouf G., MD   VIAGRA 100 MG tablet TAKE AS DIRECTED 04/01/12  Yes Hallis, Raouf G., MD   VIAGRA 100 MG tablet TAKE 1 TABLET (100 MG TOTAL) BY MOUTH AS NEEDED FOR FOR ERECTILE DYSFUNCTION. 07/31/15  Yes Hallis, Raouf G., MD       Allergies  Patient has no known allergies.    Family History  No family history on file.    Social History  Social History     Socioeconomic History   ? Marital status: Married     Spouse name: Not on file   ? Number of children: Not on file   ? Years of education: Not on file   ? Highest education level: Not on file   Occupational History   ? Not on file   Social Needs   ? Financial resource strain: Not on file   ? Food insecurity:  Worry: Not on file     Inability: Not on file   ? Transportation needs:     Medical: Not on file     Non-medical: Not on file   Tobacco Use   ? Smoking status: Never Smoker   ? Smokeless tobacco: Never Used   Substance and Sexual Activity   ? Alcohol use: Yes     Alcohol/week: 4.2 oz     Types: 7 Standard drinks or equivalent per week   ? Drug use: Not on file   ? Sexual activity: Not on file   Lifestyle   ? Physical activity:     Days per week: Not on file     Minutes per session: Not on file   ? Stress: Not on file   Relationships   ? Social connections:     Talks on phone: Not on file     Gets together: Not on file     Attends religious service: Not on file     Active member of club or organization: Not on file     Attends meetings of clubs or organizations: Not on file     Relationship status: Not on file   Other Topics Concern   ? Not on file   Social History Narrative   ? Not on file       Review of Systems:  A 14 point Review of Systems conducted was not significant other than as discussed in HPI above. (see [facility] Endocrinology questionnaire reviewed with patient, and scanned to computer).    Objective: Vitals: BP 112/49  ~ Pulse 68  ~ Wt 152 lb 9.6 oz (69.2 kg)  ~ BMI 25.39 kg/m?    Wt Readings from Last 3 Encounters:   11/23/18 152 lb 9.6 oz (69.2 kg)   10/20/18 154 lb (69.9 kg)   10/19/18 150 lb (68 kg)            Additional Findings   Gen  [x]  NAD  [x]  Well nourished   Elderly HispA man    HEENT  [x]  Normocephalic  [x]  Atraumatic       Neck  [x]  Neck supple    [x]  No cervical LAD  []  No cervical fat pad     Thyroid  [x]  Normal size  [x]  No masses/nodules  [x]  Non-tender  []  No bruit    Chest  [x]  CTABL  [x]  No wheezes  [x]  No crackles  [x]  No rales    Heart  [x]  RRR  [x]  No M/R/G      CV / Peripheral Vascular  [x]  Pedal pulses 2+  []  Carotid bruit      GI  [x]  Soft  [x]  Non-tender  [x]  Hepatosplenomegaly  []  Normal BS    Ext  [x]  No tremor  []  No edema  [x]  No clubbing  [x]  No cyanosis Left foot fx, in brace   Neuro  [x]  Normal       monofilament  []  Normal vibratory sense  [x]  Normal strength  []  DTR 2+    Derm  [x]  No foot ulcers  [] + Onychomycosis toenails. Dystrophic  [] + Scarring at insulin injx sites, mostly peri-umbilical.        Lab Review:  Lab Results   Component Value Date    HGBA1C 7.7 (H) 09/19/2018    HGBA1C 7.5 (H) 07/14/2018    HGBA1C 9.4 (H) 01/27/2018      Lab Results   Component Value Date    GLUCOSE  169 (H) 09/19/2018    ALBCREATUR  09/19/2018      Comment:      Unable to calculate. Test result is?below detection?limit.    CHOLDLQ 80 01/27/2018    CHOLDLCAL 66 09/19/2018    CPEPTIDE <0.2 (L) 09/03/2017      Lab Results   Component Value Date    CREAT 0.84 09/19/2018    BUN 14 09/19/2018    NA 135 09/19/2018    K 5.1 09/19/2018    CL 94 (L) 09/19/2018    CO2 24 09/19/2018      Lab Results   Component Value Date    WBC 6.25 09/19/2018    HGB 13.0 (L) 09/19/2018    HCT 39.2 09/19/2018    MCV 92.2 09/19/2018    PLT 294 09/19/2018      No results found for: OSMOLALITY   Lab Results   Component Value Date    KETONESUR Negative 09/19/2018      No results found for: PHVEN, PHART   Lab Results Component Value Date    CHOL 184 09/19/2018    CHOLHDL 105 09/19/2018    CHOLDLQ 80 01/27/2018    CHOLDLCAL 66 09/19/2018    TRIGLY 66 09/19/2018        Assessment/Plan:    73 y.o. male presenting to the [facility] with the following issues:    1. Type 1 Diabetes Mellitus, uncontrolled   Lab Results   Component Value Date    HGBA1C 7.7 (H) 09/19/2018    HGBA1C 7.5 (H) 07/14/2018    HGBA1C 9.4 (H) 01/27/2018     Uncontrolled T1DM.  No known DM complications.   A1C goal < 7.5%: to reduce risk of diabetes related complications to the eyes, feet and kidneys with additional goal of hypoglycemia risk reduction.   -Continue MDI with Lantus 12 units q hs and Humalog per carbs and glucose level:                   Humalog 1 unit per 10 gms of carbohydrate (sugars and starches), PLUS 1 unit extra if premeal glucose is over 150 and 2 units extra if over 200,  3 units extra if over 250, or  4 units extra if over 300.   See CDE/RN ASAP to learn to adjust insulin based on carbs and premeal glucose.    -High frequency glucose monitoring, at least before every meal and at bedtime, 4 X daily UNTIL you get a Dexcom cgm. Ordered.   -Hypoglycemia protocol reviewed. ''Rule of 15'' handout given also. Carry glucose tabs at all times.   -Check glucose prior to driving. Glucose must be > 100 mg/dl. Reviewed risks related to driving with hypoglycemia   -Glucagon reviewed.  Baqsimi (glucagon) nasal spray if possible.  Ordered.  Waiting for coupon.    -Annual ophthalmic exam due.  Referred.  Tried to get remote Optos exam but unable.  CaLL for appt.    -Foot exam up-to-date, done today   -DM educator ASAP     2. Hypertension   -Continue current regimen, including ACE-I inhibitor or ARB therapy    3. Hyperlipidemia   Per AHA guidelines, statin therapy is recommended with history of diabetes mellitus   -Continue statin therapy.    4. Diabetic microvascular disease screening:  Health Maintenance   Topic Date Due ? Diabetes: Dilated Eye Exam  11/03/2018   ? Diabetes: Hemoglobin A1c Blood Test  03/19/2019   ? Diabetes:  Kidney Monitoring  09/23/2019     5. ETOH use.  Needs to stop or reduce to safe drinking.  Discuss further with PMD.     6. Sleep Disorder.  Dtr suspects OSA.  See PMD re: eval.    7. Left ankle fx.   Wearing brace.       No orders of the defined types were placed in this encounter.      40 minutes were spent with the patient. Greater than 50% of the office visit time was devoted to counseling the patient, reviewing previous tests, discussing treatment options, and developing a follow up plan.         Author: Jeri Cos. Noreene Filbert, MD 11/23/2018 10:26 AM

## 2018-11-23 NOTE — Patient Instructions
1) No Tomar alcohol en la noche  2) Bajar Lantus 5 units in la manana y Lantus 3 units en la tarde  3) Humalog    1 unit con cada 15 gramos de carbohidratos   1 unit por cada 75 puntos mas de 150    --Correction chart:   150-225: 1 unit  226-300: 2 units  301-375: 3 units  >376: 4 units    --Only correct for a high blood sugar if 4 hours has gone by     --Try eating non-carb snacks if you are not going to give insulin    --If BG is under 100, eat 10 grams of carbs to maintain your blood sugar at work       How to treat low blood sugar: The Rule of 15's!!  - If you suspect you are having low blood sugar, check with your meter! (if it is readily available)  - Once you see that your level is under 90 mg/dl, eat 15 grams of carbohydrate.   - Wait about 15 minutes, then recheck your blood sugar level.   - If your blood sugar is still low (less than 90), consume another 15 grams of carbohydrate and recheck 15 minutes later.   - Once your sugar is 90 or higher, you may stop.   - Since blood glucose levels may begin to drop again about 4060 minutes after treatment, it is a good idea to recheck your blood glucose approximately one hour after treating a low.    The following items contain 15 grams of carbohydrate:   34 glucose tablets     1 dose of glucose gel (in most cases, 1 small tube is one dose)    1/2 cup (4 ounces) of [place] juice or regular soda (not sugar-free)    1 tablespoon of honey or syrup    1 tablespoon of sugar or 5 small sugar cubes    [place] [place], [place] [address]  Phone: [phone]  Fax: [place]  [address], [place] [address]  Phone: [phone]  Fax: [phone]

## 2018-12-14 ENCOUNTER — Telehealth: Payer: BLUE CROSS/BLUE SHIELD

## 2018-12-14 ENCOUNTER — Ambulatory Visit: Payer: BLUE CROSS/BLUE SHIELD

## 2018-12-14 NOTE — Patient Instructions
1) No Tomar alcohol en la noche  2) Bajar Lantus 5 units in la manana y Lantus 3 units en la tarde  3) Humalog    1 unit con cada 15 gramos de carbohidratos   1 unit por cada 75 puntos mas de 150    --Correction chart:   150-225: 1 unit  226-300: 2 units  301-375: 3 units  >376: 4 units    --Only correct for a high blood sugar if 4 hours has gone by     --Try eating non-carb snacks if you are not going to give insulin    --If BG is under 100, eat 10 grams of carbs to maintain your blood sugar at work       How to treat low blood sugar: The Rule of 15's!!  - If you suspect you are having low blood sugar, check with your meter! (if it is readily available)  - Once you see that your level is under 90 mg/dl, eat 15 grams of carbohydrate.   - Wait about 15 minutes, then recheck your blood sugar level.   - If your blood sugar is still low (less than 90), consume another 15 grams of carbohydrate and recheck 15 minutes later.   - Once your sugar is 90 or higher, you may stop.   - Since blood glucose levels may begin to drop again about 40?60 minutes after treatment, it is a good idea to recheck your blood glucose approximately one hour after treating a low.  ?  The following items contain 15 grams of carbohydrate:  ? 3?4 glucose tablets    ? 1 dose of glucose gel (in most cases, 1 small tube is one dose)   ? 1/2 cup (4 ounces) of [place] juice or regular soda (not sugar-free)   ? 1 tablespoon of honey or syrup   ? 1 tablespoon of sugar or 5 small sugar cubes   ? 6?8 LifeSavers     Opthalmology  [facility]  [address]  [place], [place] [address]  Phone: [phone]  Fax: [phone]  [facility]  [address] [place], [place] [address]  Phone: [phone]  Fax: [phone]

## 2018-12-14 NOTE — Progress Notes
PATIENT: Chad Watkins  MRN: [id number]  DOB: 02-Jun-1945  DATE OF SERVICE: 12/14/2018    REFERRING PRACTITIONER: Lubertha Sayres., MD   PRIMARY CARE PROVIDER: Trena Platt., DO    Reason for Consultation: Type 1 Diabetes Mellitus    Subjective:     History of Present Illness  Chad Watkins is a 73 y.o. male with type 1 diabetes mellitus. The patient presents for consultation to the [facility].       Type 1 diabetes was diagnosed in 1989, and treated with insulin then.     He denies hx of DKA, but has had ''comas'' attributed to hypoglycemia that required paramedic visits, twice in July 2019.      Patient was previously seen by Dr. Earlene Plater.   At the last visit patient was instructed to:  ''  INSTRUCTIONS:    -Continue multiple dose insulin treatment of diabetes with Lantus 12 units q hs and Humalog per carbs and glucose level:  Humalog 1 unit per 10 gms of carbohydrate (sugars and starches), PLUS 1 unit extra if premeal glucose is over 150 and 2 units extra if over 200,  3 units extra if over 250, or  4 units extra if over 300.               -See CDE/RN Rayford Halsted ASAP to learn to adjust insulin based on carbs and premeal glucose.    -High frequency glucose monitoring, at least before every meal and at bedtime, 4 X daily UNTIL you get a Dexcom cgm. Ordered.   -Hypoglycemia protocol reviewed. ''Rule of 15'' handout given also. Carry glucose tabs at all times.   -Check glucose prior to driving. Glucose must be > 100 mg/dl. Reviewed risks related to driving with hypoglycemia   -Glucagon reviewed.  Baqsimi (glucagon) nasal spray if possible.  Ordered.  Waiting for coupon.    -Annual ophthalmic exam due.  Referred.  Tried to get remote Optos exam but unable.  CaLL for appt. ''    Saw Joyce Gross,     Reports working from 1 pm to 10 pm.  He works at the [employer].     Yesterday, his blood sugar when he woke up and his blood sugar was 256, he gave himself Humalog 5 units.  He ate posole. At 6:17 pm his blood sugar was 259, bean burrito with flour tortilla, he administered Humalog 4 units.   At 10:19 pm, he had 2 tacos carne asada he administered Humalog 3 units.   Reports that he has to have a blood sugar of 200 mg/dL at bedtime otherwise his blood sugar will drop overnight.     If his blood sugar is 201 and not going to eat, he will not administer extra insulin.   If his blood sugar is 250 mg/d during the day and not going to eat, he will administer 2 units.   Roughly he says he administers ISR 1:100 for blood sugars >100    Reports that he had low blood sugars in the past due to too much insulin and not enough carbohydrates.     Monday at 10 pm his blood sugar 56, lomo saltado, arroz blanco, vegetales, lantus 3 units.  He checked his sugar 3 hours later and it was 373   humalog 3 units.      Yesterday FBS 40      Patient is injecting insulin 4x daily.   Patient is checking blood sugars >  4x daily.     Current DM Medications  Lantus 5 units in the morning and Lantus 3 units qHS  Humalog ICR 1:10 with meals  Humalog ISR 1:100 for BG >100    Glucose Review:  Data from glucose meter:      FBS 40-120  Daytime blood sugars 150-200    Exercise:   Walks. On his feet 8 hrs at work all day.      Complications & Associated Co-morbidities Yes No Comments/Treatment to Date   Retinopathy  []    [x]      Nephropathy  []    [x]      Neuropathy  []    [x]      Gastroparesis  []   [x]     CAD  []    [x]      Stroke  []    [x]      PVD  []   [x]     Sleep Apnea  [x]    []   Not sure. Poor sleep?       Past Medical History  Past Medical History:   Diagnosis Date   ? Diabetic neuropathy 01/27/2018   ? Diabetic peripheral vascular disease 09/03/2017   ? DM type 1 with diabetic mixed hyperlipidemia (HCC/RAF) 09/03/2017   ? Edema leg 07/14/2018   ? History of fracture of left ankle 12/01/2017   ? Hypertension associated with diabetes (HCC/RAF) 09/03/2017   ? Peripheral vascular disease (HCC/RAF) 09/03/2017   ? Trigger thumb 01/27/2018 ? Type 1 diabetes mellitus with diabetic neuropathy, unspecified (HCC/RAF) 09/03/2017       Past Surgical History  No past surgical history on file.    Medications  Current Outpatient Medications   Medication Sig   ? acetaminophen-codeine (TYLENOL #3) 300-30 mg tablet TAKE 1 TABLET EVERY 4 HOURS AS NEEDED FOR PAIN   ? AMLODIPINE-BENAZEPRIL 5-20 mg capsule TAKE 1 CAPSULE BY MOUTH TWO (2) TIMES DAILY.   ? ciclopirox 8% solution Apply topically at bedtime Apply over nail and surrounding skin.. (Patient not taking: Reported on 10/20/2018.)   ? FLUZONE HIGH-DOSE 0.5 ML syringe    ? FUROSEMIDE 20 mg tablet TOME UNA TABLETA TODOS LOS DIAS EN LA MANANA   ? gatifloxacin 0.5% ophthalmic solution    ? glucagon (BAQSIMI TWO PACK) 3 mg/dose nasal powder 1 actuation (3 mg total) by Left Nare route every fifteen (15) minutes as needed.   ? glucose blood test strip Use as instructed (Patient taking differently: three (3) times daily Use as instructed.)   ? insulin glargine (LANTUS) 100 units/mL injection vial USE 5 UNITS EVERY MORNING AND 3 UNITS EVERY EVENING.   ? insulin lispro, HumaLOG, (HUMALOG) 100 unit/mL injection vial 3 units sq qac plus insulin sliding scale patient has.  Hold for sugar <100.   ? insulin lispro, HumaLOG, (HUMALOG) 100 unit/mL injection vial 3 UNITS EACH MEAL PLUS AMOUNT DETERMINED BY MD'S SLIDING SCALE; HOLD FOR SUGAR UNDER 100.   ? insulin syringe needle U-100 (BD INSULIN SYRINGE U/F) 31G X 5/16'' 0.3 mL syringe Up to 7 times a day.   ? LOTEMAX SM 0.38 % GEL    ? metFORMIN (GLUCOPHAGE XR) 500 mg PO ER 24 hr tablet Take 2 tablets (1,000 mg total) by mouth two (2) times daily with meals. (Patient taking differently: Take 500 mg by mouth 2 Tablets 2 times a day.)   ? ONETOUCH ULTRA BLUE test strip USE 1 TEST STRIP TESTING 5 TIMES A DAY.   ? PROLENSA 0.07 % SOLN    ? SHINGRIX 50 MCG/0.5ML  injection    ? sildenafil 100 mg tablet Take 1 tablet (100 mg total) by mouth daily as needed for Erectile Dysfunction. ? simvastatin (ZOCOR) 20 mg tablet TOME UNA TABLETA DIARIAMENTE   ? SIMVASTATIN 20 mg tablet TOME UNA TABLETA TODOS LOS DIAS AL ACOSTARSE (Patient not taking: Reported on 10/19/2018)     No current facility-administered medications for this visit.      Prior to Admission medications    Medication Sig Start Date End Date Taking? Authorizing Provider   acetaminophen-codeine (TYLENOL #3) 300-30 mg tablet TAKE 1 TABLET EVERY 4 HOURS AS NEEDED FOR PAIN 11/18/16  Yes [provider]   BD INSULIN SYRINGE U/F 31G X 5/16'' 0.3 ML syringe INJECT 1 EACH UNDER THE SKIN FIVE (5) TIMES DAILY. 10/08/17  Yes Hallis, Raouf G., MD   CIALIS 5 MG tablet TAKE 1 TABLET (5 MG TOTAL) BY MOUTH DAILY. 09/14/14  Yes Hallis, Raouf G., MD   ciprofloxacin 0.3% ophthalmic solution 1 drop qid both eyes. 11/21/14  Yes Hallis, Raouf G., MD   glucose blood test strip Use as instructed 02/05/12  Yes Hallis, Raouf G., MD   HUMALOG 100 unit/mL injection vial USE 6 UNITS SUBCUTANEOUSLY 3 TIMES DAILY BEFORE MEALS. 10/08/17  Yes Hallis, Raouf G., MD   LANTUS 100 unit/mL injection vial INJECT 0.2 MLS (20 UNITS TOTAL) UNDER THE SKIN AT BEDTIME.. 07/27/17  Yes Hallis, Raouf G., MD   LISINOPRIL 20 mg tablet TAKE 1 TABLET (20 MG TOTAL) BY MOUTH DAILY.. 12/30/16  Yes Hallis, Raouf G., MD   metFORMIN (GLUCOPHAGE XR) 500 mg PO ER 24 hr tablet Take 2 tablets (1,000 mg total) by mouth two (2) times daily with meals. 09/05/17 09/05/18 Yes Gregor, Scott M., DO   ONETOUCH ULTRA BLUE test strip USE 1 TEST STRIP TESTING 5 TIMES A DAY. 10/05/16  Yes Hallis, Raouf G., MD   SILDENAFIL 100 mg tablet TAKE 1 TABLET (100 MG TOTAL) BY MOUTH AS NEEDED FOR FOR ERECTILE DYSFUNCTION. 11/18/16  Yes Hallis, Raouf G., MD   simvastatin (ZOCOR) 20 mg tablet TOME UNA TABLETA DIARIAMENTE 03/31/13  Yes Hallis, Raouf G., MD   SIMVASTATIN 20 mg tablet TOME UNA TABLETA TODOS LOS DIAS AL ACOSTARSE 09/27/17  Yes Hallis, Raouf G., MD VIAGRA 100 MG tablet TAKE AS DIRECTED 04/01/12  Yes Hallis, Raouf G., MD   VIAGRA 100 MG tablet TAKE 1 TABLET (100 MG TOTAL) BY MOUTH AS NEEDED FOR FOR ERECTILE DYSFUNCTION. 07/31/15  Yes Hallis, Raouf G., MD       Allergies  Patient has no known allergies.    Family History  No family history on file.    Social History  Social History     Socioeconomic History   ? Marital status: Married     Spouse name: Not on file   ? Number of children: Not on file   ? Years of education: Not on file   ? Highest education level: Not on file   Occupational History   ? Not on file   Social Needs   ? Financial resource strain: Not on file   ? Food insecurity     Worry: Not on file     Inability: Not on file   ? Transportation needs     Medical: Not on file     Non-medical: Not on file   Tobacco Use   ? Smoking status: Never Smoker   ? Smokeless tobacco: Never Used   Substance and Sexual Activity   ?  Alcohol use: Yes     Alcohol/week: 4.2 oz     Types: 7 Standard drinks or equivalent per week   ? Drug use: Not on file   ? Sexual activity: Not on file   Lifestyle   ? Physical activity     Days per week: Not on file     Minutes per session: Not on file   ? Stress: Not on file   Relationships   ? Social Wellsite geologist on phone: Not on file     Gets together: Not on file     Attends religious service: Not on file     Active member of club or organization: Not on file     Attends meetings of clubs or organizations: Not on file     Relationship status: Not on file   Other Topics Concern   ? Not on file   Social History Narrative   ? Not on file       Review of Systems:  A 14 point Review of Systems conducted was not significant other than as discussed in HPI above. (see [facility] Endocrinology questionnaire reviewed with patient, and scanned to computer).    Objective:      Vitals: There were no vitals taken for this visit.   Wt Readings from Last 3 Encounters:   11/23/18 152 lb 9.6 oz (69.2 kg)   10/20/18 154 lb (69.9 kg) 10/19/18 150 lb (68 kg)            Additional Findings   General  [x]  NAD  [x]  Well nourished      Eyes  [x]  Moist conjunctivae   [x]  Anicteric sclera []  PERRL  [x]  No lid lag    HENT  [x]  Normocephalic  [x]  Atraumatic   []  oropharynx clear with moist mucous membranes  []  no caries    Neck  []  Neck supple    []  No JVD  []  No cervical fat pad     Thyroid  []  Normal size  []  No masses/nodules  []  Non-tender  []  No bruit    Lymph  []  No cervical LAD   []  No axillary LAD      Respiratory  [x]  CTABL  [x]  No wheezes  [x]  No crackles [x]  Normal effort    CV  [x]  RRR  [x]  Normal S1/S2  []  No m/r/g       []  Pedal pulses 2+  []  No carotid bruit  []  No LE edema     Abdomen:  [x]  Soft  [x]  Non-tender  []  No organomegaly  [x]  Normal BS    MSK  [x]  Normal gait  [x]  Normal ROM   [x]  Normal muscle tone  []  No joint tenderness    Neuro  []  Normal       monofilament  []  Normal vibratory sense  []  Normal strength  []  DTR 2+    Derm  []  No foot ulcers  [x]  No rashes   []  No lipohypertrophy   +lipohypertrophy in abdomen   Psych  [x]  Appropriate affect  [x]  Alert and oriented x3        Lab Review:  Lab Results   Component Value Date    HGBA1C 7.7 (H) 09/19/2018    HGBA1C 7.5 (H) 07/14/2018    HGBA1C 9.4 (H) 01/27/2018      Lab Results   Component Value Date    GLUCOSE 169 (H) 09/19/2018    ALBCREATUR  09/19/2018  Comment:      Unable to calculate. Test result is?below detection?limit.    CHOLDLQ 80 01/27/2018    CHOLDLCAL 66 09/19/2018    CPEPTIDE <0.2 (L) 09/03/2017      Lab Results   Component Value Date    CREAT 0.84 09/19/2018    BUN 14 09/19/2018    NA 135 09/19/2018    K 5.1 09/19/2018    CL 94 (L) 09/19/2018    CO2 24 09/19/2018      Lab Results   Component Value Date    WBC 6.25 09/19/2018    HGB 13.0 (L) 09/19/2018    HCT 39.2 09/19/2018    MCV 92.2 09/19/2018    PLT 294 09/19/2018      No results found for: OSMOLALITY   Lab Results   Component Value Date    KETONESUR Negative 09/19/2018 No results found for: PHVEN, PHART   Lab Results   Component Value Date    CHOL 184 09/19/2018    CHOLHDL 105 09/19/2018    CHOLDLQ 80 01/27/2018    CHOLDLCAL 66 09/19/2018    TRIGLY 66 09/19/2018        Assessment/Plan:    73 y.o. male presenting to the [facility] with the following issues:    1. Type 1 Diabetes Mellitus, uncontrolled   Lab Results   Component Value Date    HGBA1C 7.7 (H) 09/19/2018    HGBA1C 7.5 (H) 07/14/2018    HGBA1C 9.4 (H) 01/27/2018     Uncontrolled T1DM.  No known DM complications.   A1C goal < 7.5%: to reduce risk of diabetes related complications to the eyes, feet and kidneys with additional goal of hypoglycemia risk reduction.   -Patient is having overnight hypoglycemia, recommended reducing basal insulin in the evening.    -Also, recommended using Humalog ICR 1:15 or each carb exchange as a starting dose. Will uptitrate as needed.    -Change ISR 1:50 for BG >150    -See CDE/RN ASAP to learn to adjust insulin based on carbs and premeal glucose.    -High frequency glucose monitoring, at least before every meal and at bedtime, 4 X daily UNTIL you get a Dexcom cgm. Ordered.   -Hypoglycemia protocol reviewed. ''Rule of 15'' handout given also. Carry glucose tabs at all times.   -Check glucose prior to driving. Glucose must be > 100 mg/dl. Reviewed risks related to driving with hypoglycemia   -Glucagon reviewed.  Baqsimi (glucagon) nasal spray if possible.  Ordered.  Waiting for coupon.    -Annual ophthalmic exam due.  Referred.  Tried to get remote Optos exam but unable.  CaLL for appt.    -Foot exam up-to-date, done today   -DM educator ASAP     2. Hypertension   -Continue current regimen, including ACE-I inhibitor or ARB therapy    3. Hyperlipidemia   Per AHA guidelines, statin therapy is recommended with history of diabetes mellitus   -Continue statin therapy.    4. Diabetic microvascular disease screening:  Health Maintenance   Topic Date Due ? Diabetes: Dilated Eye Exam  11/03/2018   ? Diabetes: Hemoglobin A1c Blood Test  03/19/2019   ? Diabetes: Kidney Monitoring  09/23/2019     5. ETOH use.  Needs to stop or reduce to safe drinking.  Discuss further with PMD.     6. Sleep Disorder.  Dtr suspects OSA.  See PMD re: eval.    7. Blurry vision, ordered optho referral  No orders of the defined types were placed in this encounter.    40 minutes were spent with the patient. Greater than 50% of the office visit time was devoted to counseling the patient, reviewing previous tests, discussing treatment options, and developing a follow up plan.     Author: Jeri Cos. Noreene Filbert, MD 12/14/2018 12:33 PM     There are no Patient Instructions on file for this visit.

## 2018-12-15 NOTE — Progress Notes
Patient has been scheduled for a f/u om 02/01/19

## 2019-01-04 MED ORDER — FUROSEMIDE 20 MG PO TABS
ORAL_TABLET | 0 refills | Status: AC
Start: 2019-01-04 — End: ?

## 2019-01-07 MED ORDER — ONETOUCH ULTRA VI STRP
ORAL_STRIP | ORAL | 2 refills | 30.00000 days | Status: AC
Start: 2019-01-07 — End: ?

## 2019-01-07 MED ORDER — SIMVASTATIN 20 MG PO TABS
ORAL_TABLET | 2 refills | Status: AC
Start: 2019-01-07 — End: ?

## 2019-01-16 ENCOUNTER — Ambulatory Visit: Payer: BLUE CROSS/BLUE SHIELD

## 2019-01-18 ENCOUNTER — Ambulatory Visit: Payer: BLUE CROSS/BLUE SHIELD

## 2019-01-18 ENCOUNTER — Telehealth: Payer: BLUE CROSS/BLUE SHIELD

## 2019-01-18 DIAGNOSIS — I10 Essential (primary) hypertension: Secondary | ICD-10-CM

## 2019-01-18 DIAGNOSIS — E1065 Type 1 diabetes mellitus with hyperglycemia: Secondary | ICD-10-CM

## 2019-01-18 DIAGNOSIS — E7849 Other hyperlipidemia: Secondary | ICD-10-CM

## 2019-01-23 NOTE — Telephone Encounter
Reply by: De Blanch      Patient is scheduled with Chad Watkins on 1/13 via video visit.

## 2019-02-01 ENCOUNTER — Telehealth: Payer: BLUE CROSS/BLUE SHIELD

## 2019-02-01 NOTE — Interdisciplinary
REASON FOR VISIT:    Chad Watkins is a 74 y.o. male seen in the Endocrine clinic for diabetes education.  Reason for referral:  For T1DM per Family Medicine provider Dr. Karolee Ohs.       History of Present Illness:  As documented per Dr. Karolee Ohs: He presents for his follow-up diabetic visit. He has type 1 diabetes mellitus. His disease course has been fluctuating. Hypoglycemia symptoms include confusion, dizziness, mood changes, nervousness/anxiousness, pallor, sleepiness, speech difficulty, sweats and tremors. Pertinent negatives for hypoglycemia include no headaches, hunger or seizures. Associated symptoms include fatigue and foot paresthesias. Pertinent negatives for diabetes include no blurred vision, no chest pain, no foot ulcerations, no polydipsia, no polyphagia, no polyuria, no visual change, no weakness and no weight loss. Hypoglycemia complications include blackouts, hospitalization, nocturnal hypoglycemia, required assistance and required glucagon injection. Diabetic symptom progression: FLUCTUATES. Diabetic complications include peripheral neuropathy and PVD. Pertinent negatives for diabetic complications include no autonomic neuropathy, CVA, heart disease, impotence, nephropathy or retinopathy. Risk factors for coronary artery disease include diabetes mellitus, male sex and stress. Current diabetic treatment includes insulin injections and oral agent (monotherapy) (POOR DIET, BOTH LONG ACTING AND SHORT ACTING INSULIN, PLUS METFORMIN). He is compliant with treatment most of the time (SOMETIMES DOES OWN THING WITH REGARD TO HUMALOG SPOTTING). He is currently taking insulin pre-breakfast, pre-lunch, pre-dinner and at bedtime (ACCUCHEK'S AT LEAST QAC/HS AND SOMETIMES 3 AM.  BUT SCHEDULE IS SHIFTED DUE TO WORKING LATE MORNING/NOON TO LATE NIGHT 10'ISH). Insulin injections are given by patient and relative (DAUGHTERS ACTIVE IN HIS CARE). His weight is stable. He is following a generally unhealthy diet. When asked about meal planning, he reported none (GENERALLY DOES KNOW HOW TO CARB COUNT BUT DOES NOT DO IT). He has not had a previous visit with a dietitian (NOT IN MANY YEARS.  VERY RESISTANT TO CHANGING DIET AND LIFESTYLE). He participates in exercise intermittently (VERY ACTIVE AT WORK). He monitors blood glucose at home 5+ x per day. Blood glucose monitoring compliance is good. His home blood glucose trend is fluctuating dramatically (SOME LOWS IN 30'S TO 50'S, SOME HIGHS IN 300'S). (ALL TIMES OF DAY CAN FLUCTUATE, VERY LITTLE CONSISTENCY DUE TO EATING PATTERNS, SOMETIMES NOT EATING, OTHER TIMES EATING A LOT) An ACE inhibitor/angiotensin II receptor blocker is being taken. He does not see a podiatrist.Eye exam is not current.   ?  ?    Interval History:    Seen by Dr. Noreene Filbert 11/23/2019:  Uncontrolled T1DM.  No known DM complications.   A1C goal < 7.5%: to reduce risk of diabetes related complications to the eyes, feet and kidneys with additional goal of hypoglycemia risk reduction.              -Patient is having overnight hypoglycemia, recommended reducing basal insulin in the evening.               -Also, recommended using Humalog ICR 1:15 or each carb exchange as a starting dose. Will uptitrate as needed.               -Cont ISR 1:50 for BG >150              -See CDE/RN ASAP to learn to adjust insulin based on carbs and premeal glucose.               -High frequency glucose monitoring, at least before every meal and at bedtime, 4 X daily UNTIL you get a Dexcom cgm. Ordered.              -  Hypoglycemia protocol reviewed. ''Rule of 15'' handout given also. Carry glucose tabs at all times.              -Check glucose prior to driving. Glucose must be > 100 mg/dl. Reviewed risks related to driving with hypoglycemia              -Glucagon reviewed.  Baqsimi (glucagon) nasal spray if possible.  Ordered.  Waiting for coupon.               -Annual ophthalmic exam due.  Referred.  Tried to get remote Optos exam but unable.  CaLL for appt.               -Foot exam up-to-date, done today              -DM educator ASAP   ?    Patient Instructions   1) No Tomar alcohol en la noche  2) Bajar Lantus 5 units in la manana y Lantus 3 units en la tarde  3) Humalog 1 unit con cada 15 gramos de carbohidratos               1 unit por cada 75 puntos mas de 150  ?  --Correction chart:   150-225: 1 unit  226-300: 2 units  301-375: 3 units  >376: 4 units          Seen by Dr. Karolee Ohs recently 09/19/2018 and see his detailed plan and assessment below:  DM type 1 with diabetic mixed hyperlipidemia (HCC/RAF)     3. DM (diabetes mellitus), type 1, uncontrolled w/neurologic complication (HCC/RAF)    4. DM (diabetes mellitus), type 1, uncontrolled, periph vascular complic (HCC/RAF)    5. Diabetic polyneuropathy associated with type 1 diabetes mellitus (HCC/RAF)    6. Peripheral vascular disease (HCC/RAF)    7. Edema leg    8. Healthcare maintenance    9. Uncontrolled type 1 diabetes mellitus without complication (HCC/RAF)    10. Preventative health care    ? AFTER LAST VISIT, REGIMEN SHOULD HAVE BEEN LANTUS 5 QAM AND 3 QPM, BUT PT WENT TO 5 AND 5.  DIET PATTERN FLUCTUATES TREMENDOUSLY WITH WORK SCHEDULE.  IMPLORED WITH PATIENT TO TRY TO BE CONSISTENT AND TO WATCH CARBS.  THERE IS NO RHYME OR REASON I CAN FOLLOW AT THIS TIME OF WHAT PATTERN TO SUGGEST.  DID PUT A BOTTOM STOP OF NO HUMALOG IF SUGAR CHECK IS BELOW 100 - PT DID A FEW TIMES BELIEVING WOULD EAT AND COVER 3 UNITS HE WOULD GIVE HIMSELF.  BUT TIMES IT DROPPED DOWN.  ALSO SOME 3 AM LOWS.  TRYING TO USE METFORMIN TO LEVEL OUT EATING PATTERN AND/OR INSULIN PATTERN.  FOR A PERIOD, I DID STOP THE LOWS BUT NOW GETTING THEM BACK.  WILL MOVE TO LANTUS 5 QAM AND 3 QPM, WATCH THE HUMALOG DOSING, AND CONTINUE METFORMIN.  METFORMIN XR HAS APPEARED TO CUT APPETITE DOWN.  DID TRY METFORMIN PLUS JUST HUMALOG SPOTTING BUT SUGAR CONSISTENT IN 400'S WITH THAT REGIMEN.  METFORMIN + JUST MORNING LANTUS 5 UNITS + HUMALOG SPOTTING ALSO INADEQUATE CONTROL SO ADDED NIGHTTIME DOSING.  WHEN FIRST MET ME, WAS JUST TAKING HIGH DOSES 15-20 UNITS LANTUS AT NIGHT AND SPOTTED HUMALOG ON HIS OWN MADE UP SCALE.  MAY TAKE AWAY NIGHT TIME LANTUS ALL TOGETHER AND INCREASE MORNING LANTUS WITH METFORMIN AND HUMALOG SPOTTING.  COME BACK IN 2-4 WEEKS FOR RECHECK.  IMPLORED TO SEE NUTRITION AND ENDO.    ?  Current Diabetes Medications:     Lantus 5 and 3  Metformin 500mg  qid  Humalog 1:15 and 1:75>150    MDI Assessment if Applicable   []   Insulin Pen []  Insulin Vial and Syringe   []   Correct Storage of both Opened and unopened Insulin   []   Correct preparation of Insulin, correct injection technique, and adequate injection site rotation   []   Lipodystrophy Present   []   Correct Timing of Insulin Injections  Notes:   TIMING MAY BE DELAYED OF BOLUS AND SUSPECT HE IS INJECTING INTO SCARRED TISSUE   [x]   Already assessed the above at a previous visit   []   N/A      Past Medical History:       []  None     Past Medical History:   Diagnosis Date ? Diabetic neuropathy (HCC/RAF) 01/27/2018   ? Diabetic peripheral vascular disease (HCC/RAF) 09/03/2017   ? DM type 1 with diabetic mixed hyperlipidemia (HCC/RAF) 09/03/2017   ? Edema leg 07/14/2018   ? History of fracture of left ankle 12/01/2017   ? Hypertension associated with diabetes (HCC/RAF) 09/03/2017   ? Peripheral vascular disease (HCC/RAF) 09/03/2017   ? Trigger thumb 01/27/2018   ? Type 1 diabetes mellitus with diabetic neuropathy, unspecified (HCC/RAF) 09/03/2017           Diabetes Symptoms Yes No Details, if any   Blurry vision  []    []      Numbness/tingling in hands/feet   []    []      Polyuria  []    []       Polydipsia   []    []       Dry mouth  []    []      Nausea/vomiting  []    []      Nocturnal hunger  []    []      Nightmares  []    []      Hypoglycemia  [x]    []      Night Sweats  []   []     Anxiety/Depression  []   []         There were no vitals taken for this visit.  Wt Readings from Last 3 Encounters:   11/23/18 152 lb 9.6 oz (69.2 kg)   10/20/18 154 lb (69.9 kg)   10/19/18 150 lb (68 kg)     There is no height or weight on file to calculate BMI.  No results for input(s): GLUCOSEPOC in the last 72 hours.      MEDICATIONS:   Current Outpatient Medications   Medication Sig   ? acetaminophen-codeine (TYLENOL #3) 300-30 mg tablet TAKE 1 TABLET EVERY 4 HOURS AS NEEDED FOR PAIN   ? AMLODIPINE-BENAZEPRIL 5-20 mg capsule TAKE 1 CAPSULE BY MOUTH TWO (2) TIMES DAILY.   ? ciclopirox 8% solution Apply topically at bedtime Apply over nail and surrounding skin.. (Patient not taking: Reported on 10/20/2018.)   ? FLUZONE HIGH-DOSE 0.5 ML syringe    ? FUROSEMIDE 20 mg tablet TOME UNA TABLETA TODOS LOS DIAS EN LA MANANA   ? gatifloxacin 0.5% ophthalmic solution    ? glucagon (BAQSIMI TWO PACK) 3 mg/dose nasal powder 1 actuation (3 mg total) by Left Nare route every fifteen (15) minutes as needed.   ? glucose blood test strip Use as instructed (Patient taking differently: three (3) times daily Use as instructed.) ? insulin glargine (LANTUS) 100 units/mL injection vial USE 5 UNITS EVERY MORNING AND 3 UNITS EVERY EVENING.   ? insulin lispro, HumaLOG, (HUMALOG)  100 unit/mL injection vial 3 units sq qac plus insulin sliding scale patient has.  Hold for sugar <100.   ? insulin lispro, HumaLOG, (HUMALOG) 100 unit/mL injection vial 3 UNITS EACH MEAL PLUS AMOUNT DETERMINED BY MD'S SLIDING SCALE; HOLD FOR SUGAR UNDER 100.   ? insulin syringe needle U-100 (BD INSULIN SYRINGE U/F) 31G X 5/16'' 0.3 mL syringe Up to 7 times a day.   ? LOTEMAX SM 0.38 % GEL    ? metFORMIN (GLUCOPHAGE XR) 500 mg PO ER 24 hr tablet Take 2 tablets (1,000 mg total) by mouth two (2) times daily with meals. (Patient taking differently: Take 500 mg by mouth 2 Tablets 2 times a day.)   ? ONETOUCH ULTRA test strip USE 1 TEST STRIP TESTING 5 TIMES A DAY.   ? PROLENSA 0.07 % SOLN    ? SHINGRIX 50 MCG/0.5ML injection    ? sildenafil 100 mg tablet Take 1 tablet (100 mg total) by mouth daily as needed for Erectile Dysfunction.   ? simvastatin (ZOCOR) 20 mg tablet TOME UNA TABLETA DIARIAMENTE   ? SIMVASTATIN 20 mg tablet TOME UNA TABLETA TODOS LOS DIAS AL ACOSTARSE     No current facility-administered medications for this visit.         ALLERGY:  No Known Allergies    LABS  Lab Results   Component Value Date    HGBA1C 7.7 (H) 09/19/2018               Lab Results   Component Value Date    WBC 6.25 09/19/2018    HGB 13.0 (L) 09/19/2018    HCT 39.2 09/19/2018    PLT 294 09/19/2018    NA 135 09/19/2018    K 5.1 09/19/2018    CL 94 (L) 09/19/2018    CO2 24 09/19/2018    BUN 14 09/19/2018    CREAT 0.84 09/19/2018    GLUCOSE 169 (H) 09/19/2018     Lab Results   Component Value Date    ALT 21 09/19/2018    AST 42 09/19/2018    BILITOT 0.4 09/19/2018    ALKPHOS 62 09/19/2018    ALBUMIN 4.8 09/19/2018     Lab Results   Component Value Date    CHOL 184 09/19/2018    CHOLHDL 105 09/19/2018    CHOLDLQ 80 01/27/2018    TRIGLY 66 09/19/2018     Lab Results   Component Value Date KETONESUR Negative 09/19/2018     Lab Results   Component Value Date    ALBCREATUR  09/19/2018      Comment:      Unable to calculate. Test result is?below detection?limit.     Lab Results   Component Value Date    HGBA1C 7.7 (H) 09/19/2018    HGBA1C 7.5 (H) 07/14/2018    HGBA1C 9.4 (H) 01/27/2018         SELF-MANAGEMENT     DSM TOPICS  See Diabetes Self Management Flowsheet    BLOOD GLUCOSE AVERAGE:  METER DATA  [x]   CGM GLUCOSE VALUES:   [] Dexcom  [] Medtronic  [] Libre    Today:  1/13    233- 9am      1/12  211-10am-5 units  No lunch time check: ate [place]    6:30pm-69-no humalog :   Ate avocado , [place],   3 tostadas with avocado  Eggs    10:20pm:   Ate burrito no humalog-  BG 133-  Advised to take 2 units only  1/11  10am 176-burrito with beans-3. 0   humalog 2 cookies 3:50pm-335- 1 bean burrito 7.0 units.   10:30pm-198-ate 3 tostadas with guacamole.    Impression:  Diabetes Control not at target;  However Diabetes Self Mangement probably has improved.     Interventions:  Patient checked in with daughter Chad Watkins today.   See the above blood sugars presented today verbally.   Seen by Dr. Noreene Filbert and [facility] order is pending.       Patient is following the directions for the most part however he still most likely has a poor understanding of his diabetes self management.   Regardless he was more open to feedback today compared to our first visit. Discussed Carb count today and coverage is mostly appropriate according to the cho count and isf per Dr. Noreene Filbert.   He does not test in the workplace around 2pm-3pm in which he snacks on a couple of cookies or an [place] to prevent his perceived pattern of low blood sugars.   Advised to test ac snack to see if he is trending high or low to help him decide to to an ac dinner value in the 300 range.    He sometimes will have a complete late night dinner meal and not cover with Humalog.   Advised to cover with less such as 2-3 units rather than 5-6 units so that fasting values not rise as much.       Discussed Dexcom in detail, showed sample reports, advised for patient to obtain a smart phone for dexcom so that Chad Watkins ''follow'' him on Dexcom.  Agreed that cgm data will show [name] the entire ''story'' vs perceived reasons for the highs and the lows.       Chad Watkins wishes for the [facility] Teaching to be done in [place].   Offered my virtual services which can be done on zoom in which I have a great deal of experience with in medical device teaching including pump and cgm.     Chad Watkins is to stay in touch with me PRN.   Return visit scheduled with Dr. Noreene Filbert soon in February.     All Discussions in Spanish    See DSM flowsheet for other diabetes self management topics discussed/reviewed for today's visit.      Patient verbalized understanding of the topics discussed below:        Understanding of diabetes disease process and management      Most Recent Value   Time spent on DSME and DSMES  60 minutes      Develping strategies to promote healthy coping and behavior change      Most Recent Value   Curriculum and/or handouts provided for:  See Patient Instructions Section in Epic   Behavioral Change Plan:  Continue current plan        The below recommendations were discussed with the patient.  All the patient 's questions were answered to his/her satisfaction.    No follow-ups on file.      Plan:    Patient Instructions RN INSTRUCTIONS 02/01/2019  Instructions for Chad Watkins, (daughter)    1. Thank you for checking in today and it was good to see both of you virtually.    2.  Continue to read the nutrition labels as we did today.   The 3 tostadas are 60 grams,   The burrito probably was around 45 grams.  Once the Dexcom is live, we will be able to see how well the 1 unit  for every 15 grams of carb is working.       -Please consider covering the bedtime meal;   However the fear of an overnight low is understandable.    -The burrito could use about 2 units only at 10pm  -It was good to cover the 3 tostadas with 3 units so that the blood sugars not rise to high in the am.   Again Dexcom will tell us more information.    -today we looked at calorieking on my computer screen to look up the beans.   You can upload calorieking, myfitnesspal, or other.   You can also google ''nutrition facts'' for an item that you may not be sure about.     3.   Please test in the afternoon before your snack which could consist of a fruit or 2 cookies.   If the blood sugar is trending low you probably do not need the humalog.    -However if the blood sugar is around 200, eating the snack without insulin could lead to a very high blood sugar as it did this week.  Again this is difficult to tell until we go live with Dexcom.     4.  Great effort in writing down the blood sugars, food, insulin doses, and other before today's visit.   Please let me know if you want me to plug you in for another future visit between your visits with Dr. Noreene Filbert.    Keep in Touch!  Chad Watkins             Author:  Mabeline Caras RN, CDE 02/01/2019 11:00 AM

## 2019-02-01 NOTE — Patient Instructions
RN INSTRUCTIONS 02/01/2019  Instructions for [name], (daughter)    1. Thank you for checking in today and it was good to see both of you virtually.    2.  Continue to read the nutrition labels as we did today.   The 3 tostadas are 60 grams,   The burrito probably was around 45 grams.  Once the Dexcom is live, we will be able to see how well the 1 unit for every 15 grams of carb is working.       -Please consider covering the bedtime meal;   However the fear of an overnight low is understandable.    -The burrito could use about 2 units only at 10pm  -It was good to cover the 3 tostadas with 3 units so that the blood sugars not rise to high in the am.   Again Dexcom will tell us more information.    -today we looked at calorieking on my computer screen to look up the beans.   You can upload calorieking, myfitnesspal, or other.   You can also google ''nutrition facts'' for an item that you may not be sure about.     3.   Please test in the afternoon before your snack which could consist of a fruit or 2 cookies.   If the blood sugar is trending low you probably do not need the humalog.    -However if the blood sugar is around 200, eating the snack without insulin could lead to a very high blood sugar as it did this week.  Again this is difficult to tell until we go live with Dexcom.     4.  Great effort in writing down the blood sugars, food, insulin doses, and other before today's visit.   Please let me know if you want me to plug you in for another future visit between your visits with Dr. Noreene Filbert.    Keep in Touch!  [employer]

## 2019-02-14 ENCOUNTER — Ambulatory Visit: Payer: BLUE CROSS/BLUE SHIELD

## 2019-02-14 DIAGNOSIS — H47231 Glaucomatous optic atrophy, right eye: Secondary | ICD-10-CM

## 2019-02-14 DIAGNOSIS — H40001 Preglaucoma, unspecified, right eye: Secondary | ICD-10-CM

## 2019-02-14 DIAGNOSIS — E103292 Type 1 diabetes mellitus with mild nonproliferative diabetic retinopathy without macular edema, left eye: Secondary | ICD-10-CM

## 2019-02-14 DIAGNOSIS — Z961 Presence of intraocular lens: Secondary | ICD-10-CM

## 2019-02-14 NOTE — Progress Notes
Assessment and Plan     Problem List        Eye / Vision Problems    1. Mild nonproliferative diabetic retinopathy (HCC/RAF)     Overview      None right eye.  Very minimal left eye.  Maintain good blood sugar and blood pressure under direction of diabetes   doctor.           2. Phyisological cupping of optic disc, right     Overview      Right nerve is larger than the left.  RNFL OCT 02/14/2019 normal OU.         3. Pseudophakia     Overview      Multifocal IOLs.  Not happy with near vision.  Increase OTC readers to +2.50 to +3.00.                      Orders:  Orders Placed This Encounter    OCT OPTIC NERVE HEIDELBERG OU (BOTH EYES)     Technician Instructions:     Scheduling Instructions:      Patient Instructions:       Follow ups:  Return in about 1 year (around 02/14/2020).         I discussed the above assessment and plan with the patient. He had the opportunity to ask questions, and his questions and concerns were addressed. He was reminded to call if there is any significant change or worsening in vision, or to get an evaluation, urgently if appropriate.    Author:   Ronnette Hila, OD  This note details the assessment and plan of the encounter for this date. For a complete note and record of the encounter, please see the Encounter Summary.

## 2019-02-21 ENCOUNTER — Ambulatory Visit: Payer: BLUE CROSS/BLUE SHIELD

## 2019-02-21 DIAGNOSIS — Z23 Encounter for immunization: Secondary | ICD-10-CM

## 2019-02-22 ENCOUNTER — Telehealth: Payer: BLUE CROSS/BLUE SHIELD

## 2019-02-22 DIAGNOSIS — E1065 Type 1 diabetes mellitus with hyperglycemia: Secondary | ICD-10-CM

## 2019-02-22 DIAGNOSIS — E7849 Other hyperlipidemia: Secondary | ICD-10-CM

## 2019-02-22 DIAGNOSIS — I10 Essential (primary) hypertension: Secondary | ICD-10-CM

## 2019-02-22 NOTE — Progress Notes
PATIENT: Chad Watkins  MRN: [id number]  DOB: 30-Sep-1945  DATE OF SERVICE: 02/22/2019    REFERRING PRACTITIONER: Lubertha Sayres., MD   PRIMARY CARE PROVIDER: Trena Platt., DO    Reason for Consultation: Type 1 Diabetes Mellitus    Subjective:     History of Present Illness  Chad Watkins is a 74 y.o. male with type 1 diabetes mellitus. The patient presents for consultation to the [facility].       Type 1 diabetes was diagnosed in 1989, and treated with insulin then.     He denies hx of DKA, but has had ''comas'' attributed to hypoglycemia that required paramedic visits, twice in July 2019.      Interval events:  Patient admits that he continues to guess how much insulin to give with his meals.   Patient had another severe low blood sugar.     Hypoglycemia happening due to excess bolus with meals.     DAY: Tuesday  Time Blood sugar Insulin dose (Humalog)    Before breakfast      Before Lunch      Before Dinner 400  bbq ribs, 2 tortillas Humalog 10 units    Bedtime 43, he ate jugo, banana to bring it up to 60 mg/dL.          DAY: Wednesday  Time Blood sugar Insulin dose (Humalog)    Before breakfast 217 (8:45 am)  Coffee, cream, miel Humalog 3 units Lantus 5 units   Before Lunch      Before Dinner      Bedtime          Patient is injecting insulin 4x daily.   Patient is checking blood sugars >4x daily.     Current DM Medications  Lantus 5 units in the morning and Lantus 3 units qHS  Humalog ICR 1:15 with meals  Humalog ISR 1:100 for BG >200    Glucose Review:  Data from glucose meter:      Blood sugars are quite labile.  See above.     Exercise:   Walks. On his feet 8 hrs at work all day.      Complications & Associated Co-morbidities Yes No Comments/Treatment to Date   Retinopathy  []    [x]      Nephropathy  []    [x]      Neuropathy  []    [x]      Gastroparesis  []   [x]     CAD  []    [x]      Stroke  []    [x]      PVD  []   [x]     Sleep Apnea  [x]    []   Not sure. Poor sleep? Past Medical History  Past Medical History:   Diagnosis Date   ? Diabetic neuropathy (HCC/RAF) 01/27/2018   ? Diabetic peripheral vascular disease (HCC/RAF) 09/03/2017   ? DM type 1 with diabetic mixed hyperlipidemia (HCC/RAF) 09/03/2017   ? Edema leg 07/14/2018   ? History of fracture of left ankle 12/01/2017   ? Hypertension associated with diabetes (HCC/RAF) 09/03/2017   ? Peripheral vascular disease (HCC/RAF) 09/03/2017   ? Trigger thumb 01/27/2018   ? Type 1 diabetes mellitus with diabetic neuropathy, unspecified (HCC/RAF) 09/03/2017       Past Surgical History  No past surgical history on file.    Medications  Current Outpatient Medications   Medication Sig   ? acetaminophen-codeine (TYLENOL #3) 300-30 mg tablet TAKE 1 TABLET EVERY  4 HOURS AS NEEDED FOR PAIN   ? AMLODIPINE-BENAZEPRIL 5-20 mg capsule TAKE 1 CAPSULE BY MOUTH TWO (2) TIMES DAILY.   ? ciclopirox 8% solution Apply topically at bedtime Apply over nail and surrounding skin.. (Patient not taking: Reported on 10/20/2018.)   ? FLUZONE HIGH-DOSE 0.5 ML syringe    ? FUROSEMIDE 20 mg tablet TOME UNA TABLETA TODOS LOS DIAS EN LA MANANA   ? gatifloxacin 0.5% ophthalmic solution    ? glucagon (BAQSIMI TWO PACK) 3 mg/dose nasal powder 1 actuation (3 mg total) by Left Nare route every fifteen (15) minutes as needed.   ? glucose blood test strip Use as instructed (Patient taking differently: three (3) times daily Use as instructed.)   ? insulin glargine (LANTUS) 100 units/mL injection vial USE 5 UNITS EVERY MORNING AND 3 UNITS EVERY EVENING.   ? insulin lispro, HumaLOG, (HUMALOG) 100 unit/mL injection vial 3 units sq qac plus insulin sliding scale patient has.  Hold for sugar <100.   ? insulin lispro, HumaLOG, (HUMALOG) 100 unit/mL injection vial 3 UNITS EACH MEAL PLUS AMOUNT DETERMINED BY MD'S SLIDING SCALE; HOLD FOR SUGAR UNDER 100.   ? insulin syringe needle U-100 (BD INSULIN SYRINGE U/F) 31G X 5/16'' 0.3 mL syringe Up to 7 times a day.   ? LOTEMAX SM 0.38 % GEL ? metFORMIN (GLUCOPHAGE XR) 500 mg PO ER 24 hr tablet Take 2 tablets (1,000 mg total) by mouth two (2) times daily with meals. (Patient taking differently: Take 500 mg by mouth 2 Tablets 2 times a day.)   ? ONETOUCH ULTRA test strip USE 1 TEST STRIP TESTING 5 TIMES A DAY.   ? PROLENSA 0.07 % SOLN    ? SHINGRIX 50 MCG/0.5ML injection    ? sildenafil 100 mg tablet Take 1 tablet (100 mg total) by mouth daily as needed for Erectile Dysfunction.   ? simvastatin (ZOCOR) 20 mg tablet TOME UNA TABLETA DIARIAMENTE   ? SIMVASTATIN 20 mg tablet TOME UNA TABLETA TODOS LOS DIAS AL ACOSTARSE     No current facility-administered medications for this visit.      Allergies  Patient has no known allergies.    Family History  No family history on file.    Social History  Social History     Socioeconomic History   ? Marital status: Married     Spouse name: Not on file   ? Number of children: Not on file   ? Years of education: Not on file   ? Highest education level: Not on file   Occupational History   ? Not on file   Social Needs   ? Financial resource strain: Not on file   ? Food insecurity     Worry: Not on file     Inability: Not on file   ? Transportation needs     Medical: Not on file     Non-medical: Not on file   Tobacco Use   ? Smoking status: Never Smoker   ? Smokeless tobacco: Never Used   Substance and Sexual Activity   ? Alcohol use: Yes     Alcohol/week: 4.2 oz     Types: 7 Standard drinks or equivalent per week   ? Drug use: Not on file   ? Sexual activity: Not on file   Lifestyle   ? Physical activity     Days per week: Not on file     Minutes per session: Not on file   ? Stress: Not on  file   Relationships   ? Social Wellsite geologist on phone: Not on file     Gets together: Not on file     Attends religious service: Not on file     Active member of club or organization: Not on file     Attends meetings of clubs or organizations: Not on file     Relationship status: Not on file   Other Topics Concern ? Not on file   Social History Narrative   ? Not on file       Review of Systems:  A 14 point Review of Systems conducted was not significant other than as discussed in HPI above.     Objective:      Vitals: There were no vitals taken for this visit.   Wt Readings from Last 3 Encounters:   11/23/18 152 lb 9.6 oz (69.2 kg)   10/20/18 154 lb (69.9 kg)   10/19/18 150 lb (68 kg)     Constitutional: alert and oriented, no acute distress.  Eye: no conjunctival injection. no proptosis, lid lag, or stare.  HEENT: Ears are normal without deformaties. MMM  ENMT: Trachea is midline. Thyroid appears normal in size.    Pulm: Respirations are non-labored.   MSK: Normal gait.    Integumentary: Skin intact. No rashes.   Neuro: No focal deficits noted. no hand tremor.   Psych: Cooperative. Appropriate mood and affect.       Lab Review:  Lab Results   Component Value Date    HGBA1C 7.7 (H) 09/19/2018    HGBA1C 7.5 (H) 07/14/2018    HGBA1C 9.4 (H) 01/27/2018      Lab Results   Component Value Date    GLUCOSE 169 (H) 09/19/2018    ALBCREATUR  09/19/2018      Comment:      Unable to calculate. Test result is?below detection?limit.    CHOLDLQ 80 01/27/2018    CHOLDLCAL 66 09/19/2018    CPEPTIDE <0.2 (L) 09/03/2017      Lab Results   Component Value Date    CREAT 0.84 09/19/2018    BUN 14 09/19/2018    NA 135 09/19/2018    K 5.1 09/19/2018    CL 94 (L) 09/19/2018    CO2 24 09/19/2018      Lab Results   Component Value Date    WBC 6.25 09/19/2018    HGB 13.0 (L) 09/19/2018    HCT 39.2 09/19/2018    MCV 92.2 09/19/2018    PLT 294 09/19/2018      No results found for: OSMOLALITY   Lab Results   Component Value Date    KETONESUR Negative 09/19/2018      No results found for: PHVEN, PHART   Lab Results   Component Value Date    CHOL 184 09/19/2018    CHOLHDL 105 09/19/2018    CHOLDLQ 80 01/27/2018    CHOLDLCAL 66 09/19/2018    TRIGLY 66 09/19/2018        Assessment/Plan: 74 y.o. male presenting to the [facility] with the following issues:    1. Type 1 Diabetes Mellitus, uncontrolled   Lab Results   Component Value Date    HGBA1C 7.7 (H) 09/19/2018    HGBA1C 7.5 (H) 07/14/2018    HGBA1C 9.4 (H) 01/27/2018     Uncontrolled T1DM.  No known DM complications. Complicated by severe hypoglycemia.    A1C goal < 7.5%: to  reduce risk of diabetes related complications to the eyes, feet and kidneys with additional goal of hypoglycemia risk reduction.  Patient having hypoglycemia due to excess bolus insulin.   Again discussed with patient that hypoglycemia can lead to arrhythmia, dementia, and even death.  Our number goal in his diabetes care is to avoid hypoglycemia.   Encouraged to eat foods that he knows the carbohydrate quantity.    Reminded patient of his new humalog mealtime insulin.     -Humalog ICR 1:15 or each carb exchange as a starting dose. Will uptitrate as needed.    - ISR 1:75 for BG >200    -Continue to see CDE/RN ASAP to learn to adjust insulin based on carbs and premeal glucose.    -High frequency glucose monitoring, at least before every meal and at bedtime, 4 X daily UNTIL you get a Dexcom cgm. Ordered.   -Hypoglycemia protocol reviewed. ''Rule of 15'' handout given also. Carry glucose tabs at all times.   -Check glucose prior to driving. Glucose must be > 100 mg/dl. Reviewed risks related to driving with hypoglycemia   -Glucagon reviewed.  Baqsimi (glucagon) nasal spray if possible.  Ordered.  Waiting for coupon.     2. Hypertension   -Continue current regimen, including ACE-I inhibitor or ARB therapy    3. Hyperlipidemia   Per AHA guidelines, statin therapy is recommended with history of diabetes mellitus   -Continue statin therapy.    4. Diabetic microvascular disease screening:  Health Maintenance   Topic Date Due   ? Diabetes: Hemoglobin A1c Blood Test  03/19/2019   ? Diabetes: Kidney Monitoring  09/23/2019 ? Diabetes: Dilated Eye Exam  02/14/2020     5. ETOH use.  Needs to stop or reduce to safe drinking.  Discuss further with PMD.     6. Sleep Disorder.  Dtr suspects OSA.  See PMD re: eval.    7. Blurry vision, ordered optho referral    No orders of the defined types were placed in this encounter.    I spent 30 minutes counseling and coordinating care for the items checked below on the day of service  [x]  Preparing to see the patient (e.g., review of tests)  [x]  Obtaining and or reviewing separately obtained history   [x]  Performing a medically appropriate examination and/or evaluation   [x]  Counseling and educating the patient/family/caregiver   [x]  Ordering medications, tests, or procedures  []  Referring and communicating with other healthcare professionals (when not separately reported)  [x]  Documenting clinical information in the EHR  [x]  Independently interpreting results and communicating results to patient/ family/caregiver    Author: Erie Noe A. Noreene Filbert, MD 02/22/2019 12:18 PM     Patient Instructions   1) No Tomar alcohol en la noche  2) Bajar Lantus 5 units in la manana y Lantus 3 units en la tarde  3) Humalog    1 unit con cada 15 gramos de carbohidratos   1 unit por cada 75 puntos mas de 200    --Correction chart:   200-275: 1 unit  276-350: 2 units  >351: 3 units    --Only correct for a high blood sugar if 4 hours has gone by     --Try eating non-carb snacks if you are not going to give insulin    --If BG is under 100, eat 10 grams of carbs to maintain your blood sugar at work       How to treat low blood sugar: The Rule of 15's!!  -  If you suspect you are having low blood sugar, check with your meter! (if it is readily available)  - Once you see that your level is under 70 mg/dl, eat 15 grams of carbohydrate.   - Wait about 15 minutes, then recheck your blood sugar level.   - If your blood sugar is still low (less than 70), consume another 15 grams of carbohydrate and recheck 15 minutes later. - Once your sugar is 90 or higher, you may stop.   - Since blood glucose levels may begin to drop again about 40?60 minutes after treatment, it is a good idea to recheck your blood glucose approximately one hour after treating a low.  ?  The following items contain 15 grams of carbohydrate:  ? 3?4 glucose tablets    ? 1 dose of glucose gel (in most cases, 1 small tube is one dose)   ? 1/2 cup (4 ounces) of [place] juice or regular soda (not sugar-free)   ? 1 tablespoon of honey or syrup   ? 1 tablespoon of sugar or 5 small sugar cubes   ? 6?8 LifeSavers     Opthalmology  [facility]  [address], [address]  [place], [place] [address]  Phone: [phone]  Fax: [phone]  [facility]  [address] [place], [place] [address]  Phone: [phone]  Fax: [phone]

## 2019-02-22 NOTE — Progress Notes
Hi Chad Watkins-   What is the status of the dexcom CGM.   Thank you!    VAS

## 2019-02-22 NOTE — Patient Instructions
1) No Tomar alcohol en la noche  2) Bajar Lantus 5 units in la manana y Lantus 3 units en la tarde  3) Humalog    1 unit con cada 15 gramos de carbohidratos   1 unit por cada 75 puntos mas de 200    --Correction chart:   200-275: 1 unit  276-350: 2 units  >351: 3 units    --Only correct for a high blood sugar if 4 hours has gone by     --Try eating non-carb snacks if you are not going to give insulin    --If BG is under 100, eat 10 grams of carbs to maintain your blood sugar at work       How to treat low blood sugar: The Rule of 15's!!  - If you suspect you are having low blood sugar, check with your meter! (if it is readily available)  - Once you see that your level is under 70 mg/dl, eat 15 grams of carbohydrate.   - Wait about 15 minutes, then recheck your blood sugar level.   - If your blood sugar is still low (less than 70), consume another 15 grams of carbohydrate and recheck 15 minutes later.   - Once your sugar is 90 or higher, you may stop.   - Since blood glucose levels may begin to drop again about 4060 minutes after treatment, it is a good idea to recheck your blood glucose approximately one hour after treating a low.    The following items contain 15 grams of carbohydrate:   34 glucose tablets     1 dose of glucose gel (in most cases, 1 small tube is one dose)    1/2 cup (4 ounces) of [place] juice or regular soda (not sugar-free)    1 tablespoon of honey or syrup    1 tablespoon of sugar or 5 small sugar cubes    [address] [place], [place] [address]  Phone: [phone]  Fax: [facility]  [address] [place], [place] [address]  Phone: [phone]  Fax: [phone]

## 2019-03-01 ENCOUNTER — Ambulatory Visit: Payer: BLUE CROSS/BLUE SHIELD

## 2019-03-22 ENCOUNTER — Ambulatory Visit: Payer: BLUE CROSS/BLUE SHIELD

## 2019-03-25 NOTE — Telephone Encounter
Forwarded by: Lynze Reddy NADINE Latajah Thuman

## 2019-03-28 MED ORDER — AMLODIPINE BESY-BENAZEPRIL HCL 5-20 MG PO CAPS
1 | ORAL_CAPSULE | Freq: Two times a day (BID) | ORAL | 0 refills | Status: AC
Start: 2019-03-28 — End: ?

## 2019-03-28 NOTE — Telephone Encounter
Spoke to [place] and provided number to [employer]. Emailed [facility] asking for order to be expedited.

## 2019-04-05 ENCOUNTER — Telehealth: Payer: BLUE CROSS/BLUE SHIELD

## 2019-04-05 DIAGNOSIS — E1065 Type 1 diabetes mellitus with hyperglycemia: Secondary | ICD-10-CM

## 2019-04-05 DIAGNOSIS — E7849 Other hyperlipidemia: Secondary | ICD-10-CM

## 2019-04-05 DIAGNOSIS — I10 Essential (primary) hypertension: Secondary | ICD-10-CM

## 2019-04-05 NOTE — Progress Notes
PATIENT: Chad Watkins  MRN: [id number]  DOB: 1945/11/15  DATE OF SERVICE: 04/05/2019    REFERRING PRACTITIONER: Lubertha Sayres., MD   PRIMARY CARE PROVIDER: Trena Platt., DO    Reason for Consultation: Type 1 Diabetes Mellitus    Subjective:     History of Present Illness  Chad Watkins is a 74 y.o. male with type 1 diabetes mellitus. The patient presents for consultation to the [facility].       Type 1 diabetes was diagnosed in 1989, and treated with insulin then.     He denies hx of DKA, but has had ''comas'' attributed to hypoglycemia that required paramedic visits, twice in July 2019.      Interval events:  Patient reports that he has had a bg of 55 mg/dL this morning.      Reports that his blood can go <70 mg/dL 4-5W per week.     Patient is injecting insulin 4x daily.   Patient is checking blood sugars >4x daily.     Current DM Medications  Lantus 5 units in the morning and Lantus 3 units at bedtime    Humalog ICR 1:15 with meals, if his sugar is 200 he places 5 units of humalog. If his blood sugar is >300 then he places 7 units.  If he is 100-200 he injects humalog 3-4 units  Humalog ISR 1:100 for BG >200    Glucose Review:  Data from glucose meter:      DAY: 3/1  Time Blood sugar Insulin dose     Before breakfast 92 Humalog 3 units, Lantus 5     Before Lunch 345 Humalog 7 units    Before Dinner  Humalog 0units    Bedtime 178 Humalog 0 units, Lantus       DAY: 3/2  Time Blood sugar     Before breakfast 184 Humalog 3, lantus    Before Lunch 354 Humalog 7 units    Before Dinner 145     Bedtime          Yesterday  Before breakfast bg was 137, 3 tortillas, chicharrones, salsa de tomitillo.  Humalog 3 units, Lantus 5   Before lunch his BG was 200, Humalog 5 units.   Before dinner burrito de frijoles, bg 200 mg, humalog 5 units.  Before bed, 88, lantus 3 units at bedtime, he had a banana.      Exercise:   Walks. On his feet 8 hrs at work all day.      Complications & Associated Co-morbidities Yes No Comments/Treatment to Date   Retinopathy  []    [x]      Nephropathy  []    [x]      Neuropathy  []    [x]      Gastroparesis  []   [x]     CAD  []    [x]      Stroke  []    [x]      PVD  []   [x]     Sleep Apnea  [x]    []   Not sure. Poor sleep?       Past Medical History  Past Medical History:   Diagnosis Date   ? Diabetic neuropathy (HCC/RAF) 01/27/2018   ? Diabetic peripheral vascular disease (HCC/RAF) 09/03/2017   ? DM type 1 with diabetic mixed hyperlipidemia (HCC/RAF) 09/03/2017   ? Edema leg 07/14/2018   ? History of fracture of left ankle 12/01/2017   ? Hypertension associated with diabetes (HCC/RAF) 09/03/2017   ?  Peripheral vascular disease (HCC/RAF) 09/03/2017   ? Trigger thumb 01/27/2018   ? Type 1 diabetes mellitus with diabetic neuropathy, unspecified (HCC/RAF) 09/03/2017       Past Surgical History  No past surgical history on file.    Medications  Current Outpatient Medications   Medication Sig   ? acetaminophen-codeine (TYLENOL #3) 300-30 mg tablet TAKE 1 TABLET EVERY 4 HOURS AS NEEDED FOR PAIN   ? AMLODIPINE-BENAZEPRIL 5-20 mg capsule TAKE 1 CAPSULE BY MOUTH TWO (2) TIMES DAILY.   ? ciclopirox 8% solution Apply topically at bedtime Apply over nail and surrounding skin.. (Patient not taking: Reported on 10/20/2018.)   ? FLUZONE HIGH-DOSE 0.5 ML syringe    ? FUROSEMIDE 20 mg tablet TOME UNA TABLETA TODOS LOS DIAS EN LA MANANA   ? gatifloxacin 0.5% ophthalmic solution    ? glucagon (BAQSIMI TWO PACK) 3 mg/dose nasal powder 1 actuation (3 mg total) by Left Nare route every fifteen (15) minutes as needed.   ? glucose blood test strip Use as instructed (Patient taking differently: three (3) times daily Use as instructed.)   ? insulin glargine (LANTUS) 100 units/mL injection vial USE 5 UNITS EVERY MORNING AND 3 UNITS EVERY EVENING.   ? insulin lispro, HumaLOG, (HUMALOG) 100 unit/mL injection vial 3 units sq qac plus insulin sliding scale patient has.  Hold for sugar <100.   ? insulin lispro, HumaLOG, (HUMALOG) 100 unit/mL injection vial 3 UNITS EACH MEAL PLUS AMOUNT DETERMINED BY MD'S SLIDING SCALE; HOLD FOR SUGAR UNDER 100.   ? insulin syringe needle U-100 (BD INSULIN SYRINGE U/F) 31G X 5/16'' 0.3 mL syringe Up to 7 times a day.   ? LOTEMAX SM 0.38 % GEL    ? metFORMIN (GLUCOPHAGE XR) 500 mg PO ER 24 hr tablet Take 2 tablets (1,000 mg total) by mouth two (2) times daily with meals. (Patient taking differently: Take 500 mg by mouth 2 Tablets 2 times a day.)   ? ONETOUCH ULTRA test strip USE 1 TEST STRIP TESTING 5 TIMES A DAY.   ? PROLENSA 0.07 % SOLN    ? SHINGRIX 50 MCG/0.5ML injection    ? sildenafil 100 mg tablet Take 1 tablet (100 mg total) by mouth daily as needed for Erectile Dysfunction.   ? simvastatin (ZOCOR) 20 mg tablet TOME UNA TABLETA DIARIAMENTE   ? SIMVASTATIN 20 mg tablet TOME UNA TABLETA TODOS LOS DIAS AL ACOSTARSE     No current facility-administered medications for this visit.      Allergies  Patient has no known allergies.    Family History  No family history on file.    Social History  Social History     Socioeconomic History   ? Marital status: Married     Spouse name: Not on file   ? Number of children: Not on file   ? Years of education: Not on file   ? Highest education level: Not on file   Occupational History   ? Not on file   Social Needs   ? Financial resource strain: Not on file   ? Food insecurity     Worry: Not on file     Inability: Not on file   ? Transportation needs     Medical: Not on file     Non-medical: Not on file   Tobacco Use   ? Smoking status: Never Smoker   ? Smokeless tobacco: Never Used   Substance and Sexual Activity   ? Alcohol use: Yes  Alcohol/week: 4.2 oz     Types: 7 Standard drinks or equivalent per week   ? Drug use: Not on file   ? Sexual activity: Not on file   Lifestyle   ? Physical activity     Days per week: Not on file     Minutes per session: Not on file   ? Stress: Not on file   Relationships   ? Social Wellsite geologist on phone: Not on file Gets together: Not on file     Attends religious service: Not on file     Active member of club or organization: Not on file     Attends meetings of clubs or organizations: Not on file     Relationship status: Not on file   Other Topics Concern   ? Not on file   Social History Narrative   ? Not on file       Review of Systems:  A 14 point Review of Systems conducted was not significant other than as discussed in HPI above.     Objective:      Vitals: There were no vitals taken for this visit.   Wt Readings from Last 3 Encounters:   11/23/18 152 lb 9.6 oz (69.2 kg)   10/20/18 154 lb (69.9 kg)   10/19/18 150 lb (68 kg)     Constitutional: alert and oriented, no acute distress.  Eye: no conjunctival injection. no proptosis, lid lag, or stare.  HEENT: Ears are normal without deformaties. MMM  ENMT: Trachea is midline. Thyroid appears normal in size.    Pulm: Respirations are non-labored.   MSK: Normal gait.    Integumentary: Skin intact. No rashes.   Neuro: No focal deficits noted. no hand tremor.   Psych: Cooperative. Appropriate mood and affect.       Lab Review:  Lab Results   Component Value Date    HGBA1C 7.7 (H) 09/19/2018    HGBA1C 7.5 (H) 07/14/2018    HGBA1C 9.4 (H) 01/27/2018      Lab Results   Component Value Date    GLUCOSE 169 (H) 09/19/2018    ALBCREATUR  09/19/2018      Comment:      Unable to calculate. Test result is?below detection?limit.    CHOLDLQ 80 01/27/2018    CHOLDLCAL 66 09/19/2018    CPEPTIDE <0.2 (L) 09/03/2017      Lab Results   Component Value Date    CREAT 0.84 09/19/2018    BUN 14 09/19/2018    NA 135 09/19/2018    K 5.1 09/19/2018    CL 94 (L) 09/19/2018    CO2 24 09/19/2018      Lab Results   Component Value Date    WBC 6.25 09/19/2018    HGB 13.0 (L) 09/19/2018    HCT 39.2 09/19/2018    MCV 92.2 09/19/2018    PLT 294 09/19/2018      No results found for: OSMOLALITY   Lab Results   Component Value Date    KETONESUR Negative 09/19/2018      No results found for: PHVEN, PHART   Lab Results   Component Value Date    CHOL 184 09/19/2018    CHOLHDL 105 09/19/2018    CHOLDLQ 80 01/27/2018    CHOLDLCAL 66 09/19/2018    TRIGLY 66 09/19/2018        Assessment/Plan:    74 y.o. male presenting to the [facility]  with the following issues:    1. Type 1 Diabetes Mellitus, uncontrolled   Lab Results   Component Value Date    HGBA1C 7.7 (H) 09/19/2018    HGBA1C 7.5 (H) 07/14/2018    HGBA1C 9.4 (H) 01/27/2018     Uncontrolled T1DM.  No known DM complications. Complicated by severe hypoglycemia.    A1C goal < 7.5%: to reduce risk of diabetes related complications to the eyes, feet and kidneys with additional goal of hypoglycemia risk reduction.  Blood sugars are improving with reduced hypoglycemia.  Patient is having postprandial hyperglycemia with breakfast.  Recommended increasing mealtime  Insulin with breakfast.  Please see new DM regimen below in patient instructions   -High frequency glucose monitoring, at least before every meal and at bedtime, 4 X daily UNTIL you get a Dexcom cgm. Ordered.   -Hypoglycemia protocol reviewed. ''Rule of 15'' handout given also. Carry glucose tabs at all times.   -Check glucose prior to driving. Glucose must be > 100 mg/dl. Reviewed risks related to driving with hypoglycemia   -Glucagon reviewed.  Baqsimi (glucagon) nasal spray if possible.  Ordered.  Waiting for coupon.     2. Hypertension   -Continue current regimen, including ACE-I inhibitor or ARB therapy    3. Hyperlipidemia   Per AHA guidelines, statin therapy is recommended with history of diabetes mellitus   -Continue statin therapy.    4. Diabetic microvascular disease screening:  Health Maintenance   Topic Date Due   ? Diabetes: Hemoglobin A1c Blood Test  03/19/2019   ? Diabetes: Dilated Eye Exam  02/14/2020   ? Diabetes: Kidney Monitoring  03/26/2020     5. ETOH use.  Needs to stop or reduce to safe drinking.  Discuss further with PMD.     6. Sleep Disorder.  Dtr suspects OSA.  See PMD re: eval.    7. Blurry vision, ordered optho referral    No orders of the defined types were placed in this encounter.    I spent 30 minutes counseling and coordinating care for the items checked below on the day of service  [x]  Preparing to see the patient (e.g., review of tests)  [x]  Obtaining and or reviewing separately obtained history   [x]  Performing a medically appropriate examination and/or evaluation   [x]  Counseling and educating the patient/family/caregiver   [x]  Ordering medications, tests, or procedures  []  Referring and communicating with other healthcare professionals (when not separately reported)  [x]  Documenting clinical information in the EHR  [x]  Independently interpreting results and communicating results to patient/ family/caregiver    Author: Erie Noe A. Noreene Filbert, MD 04/05/2019 9:18 AM     There are no Patient Instructions on file for this visit.

## 2019-04-08 NOTE — Patient Instructions
1) Continuar Lantus 5 units en la manana, 3 units en la noche    Para desayuno    Blood glucose   Humalog Insulin  101-200    4  Units  201-300    5 units  >301     6 units     Para Lonche y Cena    Blood glucose   Humalog Insulin  101-200    3  Units  201-300    4 units  >301     5 units

## 2019-04-17 MED ORDER — FUROSEMIDE 20 MG PO TABS
ORAL_TABLET | 0 refills | Status: AC
Start: 2019-04-17 — End: ?

## 2019-04-24 ENCOUNTER — Ambulatory Visit: Payer: BLUE CROSS/BLUE SHIELD

## 2019-05-04 ENCOUNTER — Ambulatory Visit: Payer: BLUE CROSS/BLUE SHIELD

## 2019-05-04 ENCOUNTER — Institutional Professional Consult (permissible substitution): Payer: BLUE CROSS/BLUE SHIELD

## 2019-05-04 NOTE — Patient Instructions
--A1C today at 7.5%     --Today you started the dexcom on your receiver     --El sensor es cada diez dias     --El transmitter es cada tres meses     [email or web address]    Instructions for Dexcom G6  -After 2 hour sensor warm up, always check blood glucose on meter to make sure sensor is accurate (within 20%)   -If sensor and blood glucose meter are not within 20% accuracy, use meter for the next 24 hours (while sensor further warms up) and then re-check accuracy  -Wear sensor for 10 days as instructed. Change sensor every 10 days.  -Keep track of events, such as illness, hypoglycemic symptoms, exercise, etc in event tracker.  --Always use your blood sugar meter to re-assess after treating a low and to confirm readings when your symptoms don't match the current reading.  - The system is waterproof, you can take your shower with it  -If using a receiver, keep it within 20 feet of you at all time  Remove Sensor before procedure such as CT scan and MRI  -Please call Dexcom support for any technical matters -- [phone]  -Please call the office for any medical issues/concerns with your blood glucose.     For Data Download/Interpretation:  --If using a Dexcom G6 With the receiver: Bring the receiver to your next visit so it can be downloaded in our office.  --If using a Dexcom G6 on the phone: Go to [email or web address] and follow the instructions to upload the data from your receiver.   --If using a Dexcom G6 on your mobile device: Download the [employer] app from the [employer], and log-in using the same username and password you created for your G6 mobile app. From there, you may view reports as PDF, which you can print and bring to your next diabetes appointment.     --Clarity reports: goal is 70% in target and less than 3% lows   --Try your best to log your events (insulin doses, carb amounts, exercise) at least 1 week before MD visits and/or for 3 days before reaching out for help with an adjustment between visits         Products to try to help keep the sensor adhered (can also refer to products recommended by company):   Products to be used prior to placing sensor:      --Skin tac     --Mastisol    Products to be used over the sensor:     Dexcom overpatch    Tegaderm (cut a hole in it to not cover transmitter)    Hypafix (can cut pieces needed)     Grif Grips or Sim Patch       --Can purchase these products on [employer], at a local medical supply store, or at the product's website        --You can use your sensor readings for making treatment decisions, other than the following reasons as per Dexcom instructions:     --No trend arrow    --No current glucose number or reading of LO or HIGH   --Your symptoms don't match the readings   --When you think something is incorrect    --If you need to calibrate     --Dexcom calibrations:   G6: Only calibrate if the device asks you or if more than 30% different than BG fingerstick level   --Clean and dry hands    --Avoid calibrating if you  have a straight up or down single or double arrow    --Your SG (sensor glucose) and your BG (blood glucose) values can vary up to 20%, this is considered accurate   --Your SG is telling you a larger picture; where has your SG been, what are you now, where are you going and at what speed are you going there   --Examples:   250 mg/dL on fingerstick (44% = up to 50 points different on sensor) so could be 200 mg/dL on sensor  034 mg/dL on fingerstick (74%= up to 26 points different on sensor) so could be 104 mg/dL on sensor  259 mg/dL on fingerstick (56%= up to 37 points different on sensor) so could be 222 mg/dL on sensor

## 2019-05-04 NOTE — Progress Notes
Initiation of Continuous Blood Glucose Monitoring (CGM)  Patient: Chad Watkins    MRN: [id number]  DOB: 12-05-1945  Referring Practitioner: Trena Platt., DO  Primary Care Provider:  Trena Platt., DO  Chief Complaint: Continuous Glucose Monitoring (CGM) Insertion & Training  [x]  Seen accompanied by:  Both daughters     Reason for Visit:   Chad Watkins is a 74 y.o. male with a history of type 1 diabetes who presents to clinic referred by Dr. Karolee Ohs, Lonzo Cloud., DO for CGM initiation and training.     Pre-Training   Type of CGM:   []   Dexcom G5     [x]  Dexcom G6     []   [employer] Free Style     []   Medtronic Guardian Sensor 3    Patient reviewed training material: [] Y  [x] N   Previous CGM use: [] Y  [x] N  --  Type, if used previously:        Experience:            Understanding of CGM use: []   excellent []   Good  []  fair  [x]  poor     Problems/Concerns:  Patient requiring spanish interpretation; had interpreter on the phone for entire visit to assist. Patient doesn't have a cell phone at this time so will start the dexcom on his receiver. He lives with his daughter and their plan is to eventually get him a phone.     Diabetes Self-Management:      Blood Glucose Control Issues   Last A1c:   Lab Results   Component Value Date    HGBA1C 7.7 (H) 09/19/2018       Testing BG  []  Rarely or never   []   2x /d   []  3x/day  []  4 x/day   []    /d     Glycemic Pattern   [x]  Did not bring meter/record   []  Did not review meter/record   []  Reviewed  []  Discussed Values  Per:   []  Patient report     []  Patient record     []   Meter download    []  Pump download     []  CGM download  (on file)   Average:   []   7 days  []  14 days   []  30 days        mg/dl   Standard deviation: ---      CGM Average: -- mg/dl    []   Up   []  down from ---  Standard deviation: -- []   Up   []  down from ---        % in target of 70-180 (Goal >70%)      % above target      % above 250      % below target (Goal <3%)       % below 54   Average Values Pre/post Meals (mg/dL)  Fasting:  2 hrs post breakfast:        Pre-lunch:    2 hrs post lunch:      Pre-dinner:                2 hrs post dinner:    HS:                        Noc:       Pattern        Current Diabetes Management:   Current Insulin Regimen:  Lantus 5 units in the morning and Lantus 3 units qHS  Humalog ICR 1:15 with meals  Humalog ISR 1:75 for BG >200      CGM Insertion and Training:      -Discussed procedure of sensor insertion. Explained all steps in the process, and answered any questions.  -Demonstrated sensor insertion using demo kit, and observed patient practicing with demo.  -Patient successfully self-inserted actual  sensor in right abdomen.   -Patient tolerated sensor insertion with minimal complaints or issues.    Counseling/Self-Management Training:   Total visit duration:   []  30 min  []  60 min   [x]  90 min    Patient instructed in the following content areas:   [x]  Reason for CGM    [x]  Difference b/w meter BG & CGM sensor reading   [x]  Benefits of continuous glucose monitoring  [x]  Monitoring schedule; 4x/day AC & HS   [x]  CGM device operation/maintenance  [x]  Food /activity/blood glucose record keeping    [x]  Sensor insertion    []  Daily calibration schedule/appropriate time   []  Initial calibration at  []  2 hrs   []  2 & within 6 hrs    []  Scan sensor min q 8 hrs  [x]  Warm up   [x]  2 hrs []  12 hrs     [x]  Marking events  [x]  Site management   [x]  abd  []  upper arm                [x]  Interpretation of results and trends        [x]  Alarms management                                                 [x]  Use of fingerstick BG to adjust therapy       [x]  Bathing/showering                          [x]  Use of  adhesive to secure the sensor if needed  [x]  Arrow and arrow management     [x]  Use of fingerstick BG when required  to adjust  therapy                     [x]  Indications to contact provider & contact info   [x]  Return visit with device & records     -Patient understands the function and process/procedure of using the CGM as well as the benefits of continuous glucose monitoring.          Plan:   CGM Settings:   Glucose Alerts:   []    None  Time Low High   12 am 80 250               Patient advised to do the followings:    [x]   Check BG at least 4 times per day before meals and bedtime. Will also be responsible for documenting all meals, medications, and any other events/issues that may affect BG.    [x]   Return in 2 weeks with records, CGM device/sensor, CGM supplies for skill check of sensor insertion and discussion of download.    [x]   Change CGM every []    7 days   [x]    10 days   [x]   Keep comprehensive record with BG, insulin dose, physical activity,  food intake and stress/illness.   [x]   Call vendor for any technical problems.   [x]   Call if BG or site issues         [x]   Refer to sensor adhesion guide as needed     --A1C today at 7.5%     --Today you started the dexcom on your receiver     --El sensor es cada diez dias     --El transmitter es cada tres meses     [email or web address]    Instructions for Dexcom G6  -After 2 hour sensor warm up, always check blood glucose on meter to make sure sensor is accurate (within 20%)   -If sensor and blood glucose meter are not within 20% accuracy, use meter for the next 24 hours (while sensor further warms up) and then re-check accuracy  -Wear sensor for 10 days as instructed. Change sensor every 10 days.  -Keep track of events, such as illness, hypoglycemic symptoms, exercise, etc in event tracker.  --Always use your blood sugar meter to re-assess after treating a low and to confirm readings when your symptoms don't match the current reading.  - The system is waterproof, you can take your shower with it  -If using a receiver, keep it within 20 feet of you at all time  Remove Sensor before procedure such as CT scan and MRI  -Please call Dexcom support for any technical matters -- [phone]  -Please call the office for any medical issues/concerns with your blood glucose.     For Data Download/Interpretation:  --If using a Dexcom G6 With the receiver: Bring the receiver to your next visit so it can be downloaded in our office.  --If using a Dexcom G6 on the phone: Go to [email or web address] and follow the instructions to upload the data from your receiver.   --If using a Dexcom G6 on your mobile device: Download the [employer] app from the [employer], and log-in using the same username and password you created for your G6 mobile app. From there, you may view reports as PDF, which you can print and bring to your next diabetes appointment.     --Clarity reports: goal is 70% in target and less than 3% lows   --Try your best to log your events (insulin doses, carb amounts, exercise) at least 1 week before MD visits and/or for 3 days before reaching out for help with an adjustment between visits         Products to try to help keep the sensor adhered (can also refer to products recommended by company):   Products to be used prior to placing sensor:      --Skin tac     --Mastisol    Products to be used over the sensor:     Dexcom overpatch    Tegaderm (cut a hole in it to not cover transmitter)    Hypafix (can cut pieces needed)     Grif Grips or Sim Patch       --Can purchase these products on [employer], at a local medical supply store, or at the product's website        --You can use your sensor readings for making treatment decisions, other than the following reasons as per Dexcom instructions:     --No trend arrow    --No current glucose number or reading of LO or HIGH   --Your symptoms don't match the readings   --When you think something is incorrect    --  If you need to calibrate     --Dexcom calibrations:   G6: Only calibrate if the device asks you or if more than 30% different than BG fingerstick level   --Clean and dry hands    --Avoid calibrating if you have a straight up or down single or double arrow    --Your SG (sensor glucose) and your BG (blood glucose) values can vary up to 20%, this is considered accurate   --Your SG is telling you a larger picture; where has your SG been, what are you now, where are you going and at what speed are you going there   --Examples:   250 mg/dL on fingerstick (96% = up to 50 points different on sensor) so could be 200 mg/dL on sensor  295 mg/dL on fingerstick (28%= up to 26 points different on sensor) so could be 104 mg/dL on sensor  413 mg/dL on fingerstick (24%= up to 37 points different on sensor) so could be 222 mg/dL on sensor         Follow-Up: in 2 weeks for sensor data download/interpretation, and discussion of results.     Reviewed precautions/indications (hypo/hyperglycemia sx) to contact office and/or RTC.      Chad Watkins will bring: [x]  Meter   [x]  BG, food, medication records  [x]  CGM supplies  [x]  CGMdevice/sensor     Author:  Kandice Robinsons, RN, BSN, CDE 05/04/2019 9:49 AM

## 2019-05-22 ENCOUNTER — Ambulatory Visit: Payer: BLUE CROSS/BLUE SHIELD

## 2019-05-22 ENCOUNTER — Telehealth: Payer: BLUE CROSS/BLUE SHIELD

## 2019-05-22 DIAGNOSIS — E7849 Other hyperlipidemia: Secondary | ICD-10-CM

## 2019-05-22 DIAGNOSIS — E1065 Type 1 diabetes mellitus with hyperglycemia: Secondary | ICD-10-CM

## 2019-05-22 DIAGNOSIS — I10 Essential (primary) hypertension: Secondary | ICD-10-CM

## 2019-05-22 NOTE — Progress Notes
PATIENT: Chad Watkins  MRN: [id number]  DOB: 08-Jun-1945  DATE OF SERVICE: 05/22/2019    REFERRING PRACTITIONER: Lubertha Sayres., MD   PRIMARY CARE PROVIDER: Trena Platt., DO    Reason for Consultation: Type 1 Diabetes Mellitus    Subjective:     History of Present Illness  Chad Watkins is a 74 y.o. male with type 1 diabetes mellitus. The patient presents for consultation to the [facility].       Type 1 diabetes was diagnosed in 1989, and treated with insulin then.     He denies hx of DKA, but has had ''comas'' attributed to hypoglycemia that required paramedic visits, twice in July 2019.      Interval events:  Has started using the dexcom CGM.   Had one episode of low blood sugar during work.      Reports that his blood can go <70 mg/dL a couple of times per week.      Patient is injecting insulin 4x daily.   Patient is checking blood sugars >4x daily.     Current DM Medications  Continuar Lantus 8 units en la manana    Para desayuno    Blood glucose   Humalog Insulin  101-200    4  Units  201-300    5 units  >301     6 units     Para Lonche y Cena    Blood glucose   Humalog Insulin  101-200    3  Units  201-300    4 units  >301     5 units     Glucose Review:  Data from pt recall:      Blood sugars are 70-200    Exercise:   Walks. On his feet 8 hrs at work all day.      Complications & Associated Co-morbidities Yes No Comments/Treatment to Date   Retinopathy  []    [x]      Nephropathy  []    [x]      Neuropathy  []    [x]      Gastroparesis  []   [x]     CAD  []    [x]      Stroke  []    [x]      PVD  []   [x]     Sleep Apnea  [x]    []   Not sure. Poor sleep?       Past Medical History  Past Medical History:   Diagnosis Date   ??? Diabetic neuropathy (HCC/RAF) 01/27/2018   ??? Diabetic peripheral vascular disease (HCC/RAF) 09/03/2017   ??? DM type 1 with diabetic mixed hyperlipidemia (HCC/RAF) 09/03/2017   ??? Edema leg 07/14/2018   ??? History of fracture of left ankle 12/01/2017   ??? Hypertension associated with diabetes (HCC/RAF) 09/03/2017   ??? Peripheral vascular disease (HCC/RAF) 09/03/2017   ??? Trigger thumb 01/27/2018   ??? Type 1 diabetes mellitus with diabetic neuropathy, unspecified (HCC/RAF) 09/03/2017       Past Surgical History  No past surgical history on file.    Medications  Current Outpatient Medications   Medication Sig   ??? acetaminophen-codeine (TYLENOL #3) 300-30 mg tablet TAKE 1 TABLET EVERY 4 HOURS AS NEEDED FOR PAIN   ??? AMLODIPINE-BENAZEPRIL 5-20 mg capsule TAKE 1 CAPSULE BY MOUTH TWO (2) TIMES DAILY.   ??? ciclopirox 8% solution Apply topically at bedtime Apply over nail and surrounding skin.. (Patient not taking: Reported on 10/20/2018.)   ??? FLUZONE HIGH-DOSE 0.5 ML syringe    ???  FUROSEMIDE 20 mg tablet TOME UNA TABLETA TODOS LOS DIAS EN LA MANANA   ??? gatifloxacin 0.5% ophthalmic solution    ??? glucagon (BAQSIMI TWO PACK) 3 mg/dose nasal powder 1 actuation (3 mg total) by Left Nare route every fifteen (15) minutes as needed.   ??? glucose blood test strip Use as instructed (Patient taking differently: three (3) times daily Use as instructed.)   ??? insulin glargine (LANTUS) 100 units/mL injection vial USE 5 UNITS EVERY MORNING AND 3 UNITS EVERY EVENING.   ??? insulin lispro, HumaLOG, (HUMALOG) 100 unit/mL injection vial 3 units sq qac plus insulin sliding scale patient has.  Hold for sugar <100.   ??? insulin lispro, HumaLOG, (HUMALOG) 100 unit/mL injection vial 3 UNITS EACH MEAL PLUS AMOUNT DETERMINED BY MD'S SLIDING SCALE; HOLD FOR SUGAR UNDER 100.   ??? insulin syringe needle U-100 (BD INSULIN SYRINGE U/F) 31G X 5/16'' 0.3 mL syringe Up to 7 times a day.   ??? LOTEMAX SM 0.38 % GEL    ??? metFORMIN (GLUCOPHAGE XR) 500 mg PO ER 24 hr tablet Take 2 tablets (1,000 mg total) by mouth two (2) times daily with meals. (Patient taking differently: Take 500 mg by mouth 2 Tablets 2 times a day.)   ??? ONETOUCH ULTRA test strip USE 1 TEST STRIP TESTING 5 TIMES A DAY.   ??? PROLENSA 0.07 % SOLN    ??? SHINGRIX 50 MCG/0.5ML injection    ??? sildenafil 100 mg tablet Take 1 tablet (100 mg total) by mouth daily as needed for Erectile Dysfunction.   ??? simvastatin (ZOCOR) 20 mg tablet TOME UNA TABLETA DIARIAMENTE   ??? SIMVASTATIN 20 mg tablet TOME UNA TABLETA TODOS LOS DIAS AL ACOSTARSE     No current facility-administered medications for this visit.      Allergies  Patient has no known allergies.    Family History  No family history on file.    Social History  Social History     Socioeconomic History   ??? Marital status: Married     Spouse name: Not on file   ??? Number of children: Not on file   ??? Years of education: Not on file   ??? Highest education level: Not on file   Occupational History   ??? Not on file   Social Needs   ??? Financial resource strain: Not on file   ??? Food insecurity     Worry: Not on file     Inability: Not on file   ??? Transportation needs     Medical: Not on file     Non-medical: Not on file   Tobacco Use   ??? Smoking status: Never Smoker   ??? Smokeless tobacco: Never Used   Substance and Sexual Activity   ??? Alcohol use: Yes     Alcohol/week: 4.2 oz     Types: 7 Standard drinks or equivalent per week   ??? Drug use: Not on file   ??? Sexual activity: Not on file   Lifestyle   ??? Physical activity     Days per week: Not on file     Minutes per session: Not on file   ??? Stress: Not on file   Relationships   ??? Social Wellsite geologist on phone: Not on file     Gets together: Not on file     Attends religious service: Not on file     Active member of club or organization: Not on file  Attends meetings of clubs or organizations: Not on file     Relationship status: Not on file   Other Topics Concern   ??? Not on file   Social History Narrative   ??? Not on file       Review of Systems:  A 14 point Review of Systems conducted was not significant other than as discussed in HPI above.     Objective:      Vitals: There were no vitals taken for this visit.   Wt Readings from Last 3 Encounters:   11/23/18 152 lb 9.6 oz (69.2 kg)   10/20/18 154 lb (69.9 kg) 10/19/18 150 lb (68 kg)     Constitutional: alert and oriented, no acute distress.  Eye: no conjunctival injection. no proptosis, lid lag, or stare.  HEENT: Ears are normal without deformaties. MMM  ENMT: Trachea is midline. Thyroid appears normal in size.    Pulm: Respirations are non-labored.   MSK: Normal gait.    Integumentary: Skin intact. No rashes.   Neuro: No focal deficits noted. no hand tremor.   Psych: Cooperative. Appropriate mood and affect.       Lab Review:  Lab Results   Component Value Date    HGBA1C 7.5 05/04/2019    HGBA1C 7.7 (H) 09/19/2018    HGBA1C 7.5 (H) 07/14/2018      Lab Results   Component Value Date    GLUCOSE 169 (H) 09/19/2018    ALBCREATUR  09/19/2018      Comment:      Unable to calculate. Test result is???below detection???limit.    CHOLDLQ 80 01/27/2018    CHOLDLCAL 66 09/19/2018    CPEPTIDE <0.2 (L) 09/03/2017      Lab Results   Component Value Date    CREAT 0.84 09/19/2018    BUN 14 09/19/2018    NA 135 09/19/2018    K 5.1 09/19/2018    CL 94 (L) 09/19/2018    CO2 24 09/19/2018      Lab Results   Component Value Date    WBC 6.25 09/19/2018    HGB 13.0 (L) 09/19/2018    HCT 39.2 09/19/2018    MCV 92.2 09/19/2018    PLT 294 09/19/2018      No results found for: OSMOLALITY   Lab Results   Component Value Date    KETONESUR Negative 09/19/2018      No results found for: PHVEN, PHART   Lab Results   Component Value Date    CHOL 184 09/19/2018    CHOLHDL 105 09/19/2018    CHOLDLQ 80 01/27/2018    CHOLDLCAL 66 09/19/2018    TRIGLY 66 09/19/2018        Assessment/Plan:    74 y.o. male presenting to the [facility] with the following issues:    1. Type 1 Diabetes Mellitus, uncontrolled   Lab Results   Component Value Date    HGBA1C 7.5 05/04/2019    HGBA1C 7.7 (H) 09/19/2018    HGBA1C 7.5 (H) 07/14/2018     Uncontrolled T1DM.  No known DM complications. Complicated by severe hypoglycemia.    A1C goal < 7.5%: to reduce risk of diabetes related complications to the eyes, feet and kidneys with additional goal of hypoglycemia risk reduction.  Patient is now using Dexcom CGM.   Given limited data, recommended continuing to use same regimen.   Encouraged that he write down all his blood sugars before next visit and insulin dosages (  minimum 1 week).   Please see DM regimen below in patient instructions   -High frequency glucose monitoring, at least before every meal and at bedtime, 4 X daily UNTIL you get a Dexcom cgm. Ordered.   -Hypoglycemia protocol reviewed. ''Rule of 15'' handout given also. Carry glucose tabs at all times.   -Check glucose prior to driving. Glucose must be > 100 mg/dl. Reviewed risks related to driving with hypoglycemia   -Glucagon reviewed.  Baqsimi (glucagon) nasal spray if possible.  Ordered.  Waiting for coupon.     2. Hypertension   -Continue current regimen, including ACE-I inhibitor or ARB therapy    3. Hyperlipidemia   Per AHA guidelines, statin therapy is recommended with history of diabetes mellitus   -Continue statin therapy.    4. Diabetic microvascular disease screening:  Health Maintenance   Topic Date Due   ??? Diabetes: Hemoglobin A1c Blood Test  11/03/2019   ??? Diabetes: Dilated Eye Exam  02/14/2020   ??? Diabetes: Kidney Monitoring  03/26/2020     5. ETOH use.  Needs to stop or reduce to safe drinking.  Discuss further with PMD.     6. Sleep Disorder.  Dtr suspects OSA.  See PMD re: eval.    No orders of the defined types were placed in this encounter.    I spent 30 minutes counseling and coordinating care for the items checked below on the day of service  [x]  Preparing to see the patient (e.g., review of tests)  [x]  Obtaining and or reviewing separately obtained history   [x]  Performing a medically appropriate examination and/or evaluation   [x]  Counseling and educating the patient/family/caregiver   [x]  Ordering medications, tests, or procedures  []  Referring and communicating with other healthcare professionals (when not separately reported) [x]  Documenting clinical information in the EHR  [x]  Independently interpreting results and communicating results to patient/ family/caregiver    Author: Erie Noe A. Noreene Filbert, MD 05/22/2019 10:20 AM     Patient Instructions   1) Continuar Lantus 8 en la manana    Para desayuno    Blood glucose   Humalog Insulin  101-200    4  Units  201-300    5 units  >301     6 units     Para Lonche y Cena    Blood glucose   Humalog Insulin  101-200    3  Units  201-300    4 units  >301     5 units

## 2019-05-22 NOTE — Patient Instructions
1) Continuar Lantus 8 en la manana    Para desayuno    Blood glucose   Humalog Insulin  101-200    4  Units  201-300    5 units  >301     6 units     Para Lonche y Cena    Blood glucose   Humalog Insulin  101-200    3  Units  201-300    4 units  >301     5 units

## 2019-05-23 NOTE — Progress Notes
SCHDLD FOR 07/13/19 IN PERSON W/PT'S DAUGHTER REBECCA OTP. VL 05/22/19

## 2019-06-11 MED ORDER — AMLODIPINE BESY-BENAZEPRIL HCL 5-20 MG PO CAPS
1 | ORAL_CAPSULE | Freq: Two times a day (BID) | ORAL | 0 refills | 60.00000 days
Start: 2019-06-11 — End: ?

## 2019-06-12 MED ORDER — AMLODIPINE BESY-BENAZEPRIL HCL 5-20 MG PO CAPS
1 | ORAL_CAPSULE | Freq: Two times a day (BID) | ORAL | 0 refills | 60.00000 days | Status: AC
Start: 2019-06-12 — End: ?

## 2019-07-13 ENCOUNTER — Ambulatory Visit: Payer: BLUE CROSS/BLUE SHIELD

## 2019-07-13 DIAGNOSIS — E1065 Type 1 diabetes mellitus with hyperglycemia: Secondary | ICD-10-CM

## 2019-07-13 DIAGNOSIS — E7849 Other hyperlipidemia: Secondary | ICD-10-CM

## 2019-07-13 MED ORDER — BAQSIMI TWO PACK 3 MG/DOSE NA POWD
1 | NASAL | 1 refills | Status: AC | PRN
Start: 2019-07-13 — End: ?

## 2019-07-13 NOTE — Progress Notes
PATIENT: Chad Watkins  MRN: [id number]  DOB: May 27, 1945  DATE OF SERVICE: 07/13/2019    REFERRING PRACTITIONER: No ref. provider found   PRIMARY CARE PROVIDER: Trena Platt., DO    Reason for Consultation: Type 1 Diabetes Mellitus    Subjective:     History of Present Illness  Chad Watkins is a 74 y.o. male with type 1 diabetes mellitus. The patient presents for consultation to the [facility]       Type 1 diabetes was diagnosed in 1989, and treated with insulin then.     He denies hx of DKA, but has had ''comas'' attributed to hypoglycemia that required paramedic visits, twice in July 2019.      Interval events:    Yesterday:  9 am - miel, coffee, cream, does not remember what his sugar was.  Humalog 3 units. Lantus 8 units.  1pm - 3 tacos pork, salsa. Humalog 0 units.   6pm- BG was 200 mg/dL, Humalog 4 units, whiskey punch, 1 taco carnitas.   He went low and he had a banana.  He woke up this morning with a sugar of 210 mg/dL.     This morning his blood sugar was 200 mg/dL. He had only coffee and administered Humalog 4 units.      BG now is 75 with down arrow. Patient becoming altered.   Patient was given 45 grams of apple juice and 15 gram of graham crackers.     BG was 130 mg/dL and upward 30 minutes later.      Patient is injecting insulin 4x daily.   Patient is checking blood sugars >4x daily.     Current DM Medications  Continuar Lantus 8 units en la manana    Para desayuno    Blood glucose   Humalog Insulin  101-200    4  Units  201-300    5 units  >301     6 units     Para Lonche y Cena    Blood glucose   Humalog Insulin  101-200    3  Units  201-300    4 units  >301     5 units     Glucose Review:  Data from CGM:      Dexcom Personal CGM Physician Interpretation Report  I reviewed the report from the download of the CGM and the sensor data.  Data was reviewed for 06/30/2019 through 07/13/2019  The average was 165 mg/dL with a coefficient variation of 38.6 %; Time in range 58.9%, below 70 mg/dL 4.5%, very low below 54 mg/dL 4.0%, above 981 mg/dL 19.1%, above 478 mg/dL 29.5%.  In examining the patterns and providing interpretation, I noted PP hyperglycemia when not bolusing for meals. Hypoglycemia trends when bolusing full amount of insulin and not having his meal or having alcohol in the evenings. Based on CGM interpetration, I recommend 1) bolusing for all meals even when blood glucose is 101-150 mg/dL, he can consider doing 50% if is worried about hypoglycemia 2) needs to eat meals whenever he uses his scale as it is a combined scale to cover carbohydrates and high blood sugars 3) use 50% of insulin if he is going to have alcohol in the evenings with his meal 4) reduce basal insulin slightly.      Exercise:   Walks. On his feet 8 hrs at work all day.      Complications & Associated Co-morbidities Yes No Comments/Treatment to  Date   Retinopathy  []    [x]      Nephropathy  []    [x]      Neuropathy  []    [x]      Gastroparesis  []   [x]     CAD  []    [x]      Stroke  []    [x]      PVD  []   [x]     Sleep Apnea  [x]    []   Not sure. Poor sleep?       Past Medical History  Past Medical History:   Diagnosis Date   ??? Diabetic neuropathy (HCC/RAF) 01/27/2018   ??? Diabetic peripheral vascular disease (HCC/RAF) 09/03/2017   ??? DM type 1 with diabetic mixed hyperlipidemia (HCC/RAF) 09/03/2017   ??? Edema leg 07/14/2018   ??? History of fracture of left ankle 12/01/2017   ??? Hypertension associated with diabetes (HCC/RAF) 09/03/2017   ??? Peripheral vascular disease (HCC/RAF) 09/03/2017   ??? Trigger thumb 01/27/2018   ??? Type 1 diabetes mellitus with diabetic neuropathy, unspecified (HCC/RAF) 09/03/2017       Past Surgical History  No past surgical history on file.    Medications  Current Outpatient Medications   Medication Sig   ??? acetaminophen-codeine (TYLENOL #3) 300-30 mg tablet TAKE 1 TABLET EVERY 4 HOURS AS NEEDED FOR PAIN   ??? AMLODIPINE-BENAZEPRIL 5-20 mg capsule TAKE 1 CAPSULE BY MOUTH TWO (2) TIMES DAILY.   ??? ciclopirox 8% solution Apply topically at bedtime Apply over nail and surrounding skin.. (Patient not taking: Reported on 10/20/2018.)   ??? FLUZONE HIGH-DOSE 0.5 ML syringe    ??? FUROSEMIDE 20 mg tablet TOME UNA TABLETA TODOS LOS DIAS EN LA MANANA   ??? gatifloxacin 0.5% ophthalmic solution    ??? glucagon (BAQSIMI TWO PACK) 3 mg/dose nasal powder 1 actuation (3 mg total) by Left Nare route every fifteen (15) minutes as needed.   ??? glucose blood test strip Use as instructed (Patient taking differently: three (3) times daily Use as instructed.)   ??? insulin glargine (LANTUS) 100 units/mL injection vial USE 5 UNITS EVERY MORNING AND 3 UNITS EVERY EVENING.   ??? insulin lispro, HumaLOG, (HUMALOG) 100 unit/mL injection vial 3 units sq qac plus insulin sliding scale patient has.  Hold for sugar <100.   ??? insulin lispro, HumaLOG, (HUMALOG) 100 unit/mL injection vial 3 UNITS EACH MEAL PLUS AMOUNT DETERMINED BY MD'S SLIDING SCALE; HOLD FOR SUGAR UNDER 100.   ??? insulin syringe needle U-100 (BD INSULIN SYRINGE U/F) 31G X 5/16'' 0.3 mL syringe Up to 7 times a day.   ??? LOTEMAX SM 0.38 % GEL    ??? metFORMIN (GLUCOPHAGE XR) 500 mg PO ER 24 hr tablet Take 2 tablets (1,000 mg total) by mouth two (2) times daily with meals. (Patient taking differently: Take 500 mg by mouth 2 Tablets 2 times a day.)   ??? ONETOUCH ULTRA test strip USE 1 TEST STRIP TESTING 5 TIMES A DAY.   ??? PROLENSA 0.07 % SOLN    ??? SHINGRIX 50 MCG/0.5ML injection    ??? sildenafil 100 mg tablet Take 1 tablet (100 mg total) by mouth daily as needed for Erectile Dysfunction.   ??? simvastatin (ZOCOR) 20 mg tablet TOME UNA TABLETA DIARIAMENTE   ??? SIMVASTATIN 20 mg tablet TOME UNA TABLETA TODOS LOS DIAS AL ACOSTARSE     No current facility-administered medications for this visit.      Allergies  Patient has no known allergies.    Family History  No family history  on file.    Social History  Social History     Socioeconomic History   ??? Marital status: Married     Spouse name: Not on file   ??? Number of children: Not on file   ??? Years of education: Not on file   ??? Highest education level: Not on file   Occupational History   ??? Not on file   Social Needs   ??? Financial resource strain: Not on file   ??? Food insecurity     Worry: Not on file     Inability: Not on file   ??? Transportation needs     Medical: Not on file     Non-medical: Not on file   Tobacco Use   ??? Smoking status: Never Smoker   ??? Smokeless tobacco: Never Used   Substance and Sexual Activity   ??? Alcohol use: Yes     Alcohol/week: 4.2 oz     Types: 7 Standard drinks or equivalent per week   ??? Drug use: Not on file   ??? Sexual activity: Not on file   Lifestyle   ??? Physical activity     Days per week: Not on file     Minutes per session: Not on file   ??? Stress: Not on file   Relationships   ??? Social Wellsite geologist on phone: Not on file     Gets together: Not on file     Attends religious service: Not on file     Active member of club or organization: Not on file     Attends meetings of clubs or organizations: Not on file     Relationship status: Not on file   Other Topics Concern   ??? Not on file   Social History Narrative   ??? Not on file       Review of Systems:  A 14 point Review of Systems conducted was not significant other than as discussed in HPI above.     Objective:      Vitals: BP 122/62  ~ Pulse 85  ~ Wt 153 lb (69.4 kg)  ~ BMI 25.46 kg/m???    Wt Readings from Last 3 Encounters:   11/23/18 152 lb 9.6 oz (69.2 kg)   10/20/18 154 lb (69.9 kg)   10/19/18 150 lb (68 kg)     Constitutional: alert and oriented, no acute distress.  Eye: no conjunctival injection. no proptosis, lid lag, or stare.  HEENT: Ears are normal without deformaties. MMM  ENMT: Trachea is midline. Thyroid appears normal in size.    Pulm: Respirations are non-labored.   MSK: Normal gait.    Integumentary: Skin intact. No rashes.   Neuro: No focal deficits noted. no hand tremor.   Psych: Cooperative. Appropriate mood and affect.       Lab Review:  Lab Results   Component Value Date    HGBA1C 7.5 05/04/2019    HGBA1C 7.7 (H) 09/19/2018    HGBA1C 7.5 (H) 07/14/2018      Lab Results   Component Value Date    GLUCOSE 169 (H) 09/19/2018    ALBCREATUR  09/19/2018      Comment:      Unable to calculate. Test result is???below detection???limit.    CHOLDLQ 80 01/27/2018    CHOLDLCAL 66 09/19/2018    CPEPTIDE <0.2 (L) 09/03/2017      Lab Results   Component Value Date    CREAT 0.84 09/19/2018  BUN 14 09/19/2018    NA 135 09/19/2018    K 5.1 09/19/2018    CL 94 (L) 09/19/2018    CO2 24 09/19/2018      Lab Results   Component Value Date    WBC 6.25 09/19/2018    HGB 13.0 (L) 09/19/2018    HCT 39.2 09/19/2018    MCV 92.2 09/19/2018    PLT 294 09/19/2018      No results found for: OSMOLALITY   Lab Results   Component Value Date    KETONESUR Negative 09/19/2018      No results found for: PHVEN, PHART   Lab Results   Component Value Date    CHOL 184 09/19/2018    CHOLHDL 105 09/19/2018    CHOLDLQ 80 01/27/2018    CHOLDLCAL 66 09/19/2018    TRIGLY 66 09/19/2018        Assessment/Plan:    74 y.o. male presenting to the [facility] with the following issues:    1. Type 1 Diabetes Mellitus, uncontrolled   Lab Results   Component Value Date    HGBA1C 7.5 05/04/2019    HGBA1C 7.7 (H) 09/19/2018    HGBA1C 7.5 (H) 07/14/2018     Uncontrolled T1DM.  No known DM complications. Complicated by severe hypoglycemia.    A1C goal < 7.5%: to reduce risk of diabetes related complications to the eyes, feet and kidneys with additional goal of hypoglycemia risk reduction.  On CGM,  I noted PP hyperglycemia when not bolusing for meals. Hypoglycemia trends when bolusing full amount of insulin and not having his meal or having alcohol in the evenings. Based on CGM interpetration, I recommend 1) bolusing for all meals even when blood glucose is 101-150 mg/dL, he can consider doing 50% if is worried about hypoglycemia 2) needs to eat meals whenever he uses his scale as it is a combined scale to cover carbohydrates and high blood sugars 3) use 50% of insulin if he is going to have alcohol in the evenings with his meal 4) reduce basal insulin slightly.   -Please see new DM regimen below in patient instructions   -High frequency glucose monitoring with CGM   -Hypoglycemia protocol reviewed. ''Rule of 15'' handout given also. Carry glucose tabs at all times.   -Check glucose prior to driving. Glucose must be > 100 mg/dl. Reviewed risks related to driving with hypoglycemia   -Glucagon reviewed.  Baqsimi (glucagon) nasal spray if possible.  Ordered.  Waiting for coupon.     2. Hypertension   -Continue current regimen, including ACE-I inhibitor or ARB therapy    3. Hyperlipidemia   Per AHA guidelines, statin therapy is recommended with history of diabetes mellitus   -Continue statin therapy.    4. Diabetic microvascular disease screening:  Health Maintenance   Topic Date Due   ??? Diabetes: Hemoglobin A1c Blood Test  11/03/2019   ??? Diabetes: Dilated Eye Exam  02/14/2020   ??? Diabetes: Kidney Monitoring  06/11/2020     5. ETOH use.  Needs to stop or reduce to safe drinking.  Discuss further with PMD.     6. Sleep Disorder.  Dtr suspects OSA.  See PMD re: eval.    No orders of the defined types were placed in this encounter.    I spent 40 minutes counseling and coordinating care for the items checked below on the day of service  [x]  Preparing to see the patient (e.g., review of tests)  [  x] Obtaining and or reviewing separately obtained history   [x]  Performing a medically appropriate examination and/or evaluation   [x]  Counseling and educating the patient/family/caregiver   [x]  Ordering medications, tests, or procedures  []  Referring and communicating with other healthcare professionals (when not separately reported)  [x]  Documenting clinical information in the EHR  [x]  Independently interpreting results and communicating results to patient/ family/caregiver    Author: Erie Noe A. Noreene Filbert, MD 07/13/2019 10:25 AM     Patient Instructions   1) Bajar Lantus 7 en la manana  2) Si vas a tomar alcohol, necesitas bajar tu Humalog insulina por 50% O 2 unidades  3) Si la azucar la tienes >200 mg/dl antes de comer, ponte la insulina y despues comer a los 30 minutos.      Para desayuno    Blood glucose   Humalog Insulin  101-200    4  Units  201-300    5 units  >301     6 units     Para Lonche y Cena    Blood glucose   Humalog Insulin  101-200    3  Units  201-300    4 units  >301     5 units

## 2019-07-13 NOTE — Patient Instructions
1) Bajar Lantus 7 en la manana  2) Si vas a tomar alcohol, necesitas bajar tu Humalog insulina por 50% O 2 unidades    Para desayuno    Blood glucose   Humalog Insulin  101-200    4  Units  201-300    5 units  >301     6 units     Para Lonche y Cena    Blood glucose   Humalog Insulin  101-200    3  Units  201-300    4 units  >301     5 units

## 2019-07-14 DIAGNOSIS — I10 Essential (primary) hypertension: Secondary | ICD-10-CM

## 2019-07-14 MED ORDER — FUROSEMIDE 20 MG PO TABS
ORAL_TABLET | 0 refills | Status: AC
Start: 2019-07-14 — End: ?

## 2019-09-08 MED ORDER — AMLODIPINE BESY-BENAZEPRIL HCL 5-20 MG PO CAPS
1 | ORAL_CAPSULE | Freq: Two times a day (BID) | ORAL | 0 refills | Status: AC
Start: 2019-09-08 — End: ?

## 2019-09-19 ENCOUNTER — Ambulatory Visit: Payer: BLUE CROSS/BLUE SHIELD

## 2019-09-19 DIAGNOSIS — Z0000 Encounter for general adult medical examination without abnormal findings: Secondary | ICD-10-CM

## 2019-09-19 DIAGNOSIS — Z794 Long term (current) use of insulin: Secondary | ICD-10-CM

## 2019-09-19 DIAGNOSIS — E103292 Type 1 diabetes mellitus with mild nonproliferative diabetic retinopathy without macular edema, left eye: Secondary | ICD-10-CM

## 2019-09-19 DIAGNOSIS — E1051 Type 1 diabetes mellitus with diabetic peripheral angiopathy without gangrene: Secondary | ICD-10-CM

## 2019-09-19 DIAGNOSIS — K635 Polyp of colon: Secondary | ICD-10-CM

## 2019-09-19 DIAGNOSIS — E1042 Type 1 diabetes mellitus with diabetic polyneuropathy: Secondary | ICD-10-CM

## 2019-09-19 MED ORDER — INSULIN LISPRO 100 UNIT/ML SC SOLN
2 refills | Status: AC
Start: 2019-09-19 — End: ?

## 2019-09-19 MED ORDER — INSULIN LISPRO 100 UNIT/ML SC SOLN
1 refills | Status: AC
Start: 2019-09-19 — End: ?

## 2019-09-19 NOTE — Patient Instructions
[facility] wants you to have a voice in your healthcare. Completing an advance directive is a good way to ensure that we honor your wishes for health care. This allows you to:  ??? Name a trusted person to help make medical decisions in case of an emergency  ??? Tell this trusted person and your doctors what is most important in your life and for your healthcare now and in the future     You are receiving this message because you do not have an enduring advance directive (or POLST) in the [place] medical record or your document is more than five years old. Your physician wants to ensure that you have an advance directive and it is current. Please look over and fill in an advance directive. You can access a [place] advance directive at [email or web address]    Discuss your advance directive and wishes for medical care at the next visit with your physician. Turn in your advance directive to your physician by mail or FAX, through your [place] portal, or bring it to clinic

## 2019-09-20 LAB — Magnesium: MAGNESIUM: 1.4 meq/L (ref 1.4–1.9)

## 2019-09-20 LAB — Lipid Panel: TRIGLYCERIDES: 63 mg/dL (ref <150)

## 2019-09-20 LAB — Vitamin B12: VITAMIN B12: >4000 pg/mL — ABNORMAL HIGH (ref 254–1060)

## 2019-09-20 LAB — TSH with reflex FT4, FT3: TSH: 0.58 u[IU]/mL (ref 0.3–4.7)

## 2019-09-20 LAB — PSA,Free & Total Profile: PSA,TOTAL: 0.56 ng/mL (ref 0–6.5)

## 2019-09-20 LAB — Differential Automated: ABSOLUTE LYMPHOCYTE COUNT: 2.6 10*3/uL (ref 1.30–3.40)

## 2019-09-20 LAB — Hgb A1c: HGB A1C - HPLC: 7.4 — ABNORMAL HIGH (ref <5.7)

## 2019-09-20 LAB — Albumin/Creatinine Ratio,Urine: CREATININE,RANDOM URINE: 75.1 mg/dL

## 2019-09-20 LAB — PTH, Intact: PTH, INTACT: 34 pg/mL (ref 11–51)

## 2019-09-20 LAB — Comprehensive Metabolic Panel
CREATININE: 0.96 mg/dL (ref 0.60–1.30)
TOTAL CO2: 26 mmol/L (ref 20–30)

## 2019-09-20 LAB — Vitamin D,25-Hydroxy: VITAMIN D,25-HYDROXY: 65 ng/mL — ABNORMAL HIGH (ref 20–50)

## 2019-09-20 LAB — Folate,Serum: FOLATE,SERUM: 23.7 ng/mL (ref 8.1–30.4)

## 2019-09-20 LAB — Uric Acid: URIC ACID: 7.7 mg/dL (ref 3.4–8.8)

## 2019-09-20 LAB — UA,Microscopic: SQUAMOUS EPITHELIAL CELLS: 0 {cells}/uL (ref 0–17)

## 2019-09-20 LAB — CBC: HEMATOCRIT: 37.1 — ABNORMAL LOW (ref 38.5–52.0)

## 2019-09-20 LAB — UA,Dipstick: NITRITE: NEGATIVE (ref 1.005–1.030)

## 2019-09-21 ENCOUNTER — Ambulatory Visit: Payer: BLUE CROSS/BLUE SHIELD

## 2019-09-21 DIAGNOSIS — I10 Essential (primary) hypertension: Secondary | ICD-10-CM

## 2019-09-21 DIAGNOSIS — E7849 Other hyperlipidemia: Secondary | ICD-10-CM

## 2019-09-21 DIAGNOSIS — E1065 Type 1 diabetes mellitus with hyperglycemia: Secondary | ICD-10-CM

## 2019-09-21 LAB — Testosterone, Free, Total and  SHBG, Men: TESTOSTERONE, BIOAVAILABLE: 198 ng/dL (ref 131–682)

## 2019-09-21 NOTE — Patient Instructions
1) Bajar Lantus 7 en la manana  2) Si vas a tomar alcohol, necesitas bajar tu Humalog insulina por 50% O 2 unidades  3) Si la azucar la tienes >200 mg/dl antes de comer, ponte la insulina y despues comer a los 30 minutos.      Para desayuno    Blood glucose   Humalog Insulin  101-200    4  Units  201-300    5 units  >301     6 units     Para Lonche y Cena    Blood glucose   Humalog Insulin  101-200    3  Units  201-300    4 units  >301     5 units

## 2019-09-21 NOTE — Progress Notes
PATIENT: Chad Watkins  MRN: [id number]  DOB: 02-01-1945  DATE OF SERVICE: 09/21/2019    REFERRING PRACTITIONER: No ref. provider found   PRIMARY CARE PROVIDER: Trena Platt., DO    Reason for Consultation: Type 1 Diabetes Mellitus    Subjective:     History of Present Illness  Chad Watkins is a 74 y.o. male with type 1 diabetes mellitus. The patient presents for consultation to the [facility].       Type 1 diabetes was diagnosed in 1989, and treated with insulin then.     He denies hx of DKA, but has had ''comas'' attributed to hypoglycemia that required paramedic visits, twice in July 2019.      Interval events:  Working 5 days per week and sometimes 6 days per week.     Following his DM regimen.   Denies any low blood sugars.     Patient is testing BG 4x daily when not using CGM.   Patient uses insulin 4x daily.     When patient has CGM device, patient uses it consistently.   Patient tests blood glucose to calibrate their CGM.   Patient continues to dose insulin >3x/day and frequently adjusts insulin dosage based on the CGM device information.      Current DM Medications  Continuar Lantus 7 units en la manana    Para desayuno    Blood glucose   Humalog Insulin  101-200    4  Units  201-300    5 units  >301     6 units     Para Lonche y Cena    Blood glucose   Humalog Insulin  101-200    3  Units  201-300    4 units  >301     5 units     Glucose Review:  Data from CGM:      [employer] Personal CGM Physician Interpretation Report  I reviewed the report from the download of the CGM and the sensor data.  Data was reviewed for 09/08/2019 through 09/21/2019  The average was 178 mg/dL with a coefficient variation of 33.8 %; Time in range 55%, below 70 mg/dL 7.8%, very low below 54 mg/dL 2.9%, above 562 mg/dL 13.0%, above 865 mg/dL 78.4%.  In examining the patterns and providing interpretation, I noted normoglycemia in the morning.  PP hyperglycemia after breakfast/lunch when he is working. Normoglycemia after dinner with very rare PP hyperglycemia after dinner.  No hypoglycemia trends. Based on CGM interpetration, I recommend 1) continue same regimen.        Exercise:   Walks. On his feet 8 hrs at work all day.      Complications & Associated Co-morbidities Yes No Comments/Treatment to Date   Retinopathy  []    [x]      Nephropathy  []    [x]      Neuropathy  []    [x]      Gastroparesis  []   [x]     CAD  []    [x]      Stroke  []    [x]      PVD  []   [x]     Sleep Apnea  [x]    []   Not sure. Poor sleep?       Past Medical History  Past Medical History:   Diagnosis Date   ??? Colon polyps-2020 09/19/2019   ??? Diabetic neuropathy (HCC/RAF) 01/27/2018   ??? Diabetic peripheral vascular disease (HCC/RAF) 09/03/2017   ??? DM type 1 with  diabetic mixed hyperlipidemia (HCC/RAF) 09/03/2017   ??? Edema leg 07/14/2018   ??? History of fracture of left ankle 12/01/2017   ??? Hypertension associated with diabetes (HCC/RAF) 09/03/2017   ??? Long term (current) use of insulin (HCC/RAF) 09/19/2019   ??? Peripheral vascular disease (HCC/RAF) 09/03/2017   ??? Trigger thumb 01/27/2018   ??? Type 1 diabetes mellitus with diabetic neuropathy, unspecified (HCC/RAF) 09/03/2017       Past Surgical History  No past surgical history on file.    Medications  Current Outpatient Medications   Medication Sig   ??? acetaminophen-codeine (TYLENOL #3) 300-30 mg tablet TAKE 1 TABLET EVERY 4 HOURS AS NEEDED FOR PAIN   ??? AMLODIPINE-BENAZEPRIL 5-20 mg capsule TAKE 1 CAPSULE BY MOUTH TWO (2) TIMES DAILY.   ??? ciclopirox 8% solution Apply topically at bedtime Apply over nail and surrounding skin.. (Patient not taking: Reported on 10/20/2018.)   ??? FLUZONE HIGH-DOSE 0.5 ML syringe    ??? FUROSEMIDE 20 mg tablet TOME UNA TABLETA TODOS LOS DIAS EN LA MANANA   ??? gatifloxacin 0.5% ophthalmic solution    ??? glucagon (BAQSIMI TWO PACK) 3 mg/dose nasal powder 1 actuation (3 mg total) by Left Nare route every fifteen (15) minutes as needed.   ??? glucose blood test strip Use as instructed (Patient not taking: Reported on 07/13/2019.)   ? insulin glargine (LANTUS) 100 units/mL injection vial USE 5 UNITS EVERY MORNING AND 3 UNITS EVERY EVENING.   ? insulin lispro, HumaLOG, (HUMALOG) 100 unit/mL injection vial 3 units sq qac plus insulin sliding scale patient has.  Hold for sugar <100.   ? insulin lispro, HumaLOG, (HUMALOG) 100 unit/mL injection vial 3 UNITS EACH MEAL PLUS AMOUNT DETERMINED BY MD'S SLIDING SCALE; HOLD FOR SUGAR UNDER 100.   ? insulin syringe needle U-100 (BD INSULIN SYRINGE U/F) 31G X 5/16'' 0.3 mL syringe Up to 7 times a day.   ? LOTEMAX SM 0.38 % GEL    ? ONETOUCH ULTRA test strip USE 1 TEST STRIP TESTING 5 TIMES A DAY.   ? PROLENSA 0.07 % SOLN    ? SHINGRIX 50 MCG/0.5ML injection    ? sildenafil 100 mg tablet Take 1 tablet (100 mg total) by mouth daily as needed for Erectile Dysfunction.   ? simvastatin (ZOCOR) 20 mg tablet TOME UNA TABLETA DIARIAMENTE   ? SIMVASTATIN 20 mg tablet TOME UNA TABLETA TODOS LOS DIAS AL ACOSTARSE     No current facility-administered medications for this visit.      Allergies  Patient has no known allergies.    Family History  No family history on file.    Social History  Social History     Socioeconomic History   ? Marital status: Married     Spouse name: Not on file   ? Number of children: Not on file   ? Years of education: Not on file   ? Highest education level: Not on file   Occupational History   ? Not on file   Social Needs   ? Financial resource strain: Not on file   ? Food insecurity     Worry: Not on file     Inability: Not on file   ? Transportation needs     Medical: Not on file     Non-medical: Not on file   Tobacco Use   ? Smoking status: Never Smoker   ? Smokeless tobacco: Never Used   Substance and Sexual Activity   ? Alcohol use: Yes  Alcohol/week: 4.2 oz     Types: 7 Standard drinks or equivalent per week   ? Drug use: Not on file   ? Sexual activity: Not on file   Lifestyle   ? Physical activity     Days per week: Not on file     Minutes per session: Not on file   ??? Stress: Not on file   Relationships   ??? Social Wellsite geologist on phone: Not on file     Gets together: Not on file     Attends religious service: Not on file     Active member of club or organization: Not on file     Attends meetings of clubs or organizations: Not on file     Relationship status: Not on file   Other Topics Concern   ??? Not on file   Social History Narrative   ??? Not on file       Review of Systems:  A 14 point Review of Systems conducted was not significant other than as discussed in HPI above.     Objective:      Vitals: BP 115/51    Wt Readings from Last 3 Encounters:   09/19/19 142 lb 3.2 oz (64.5 kg)   07/13/19 153 lb (69.4 kg)   11/23/18 152 lb 9.6 oz (69.2 kg)     Constitutional: alert and oriented, no acute distress.  Eye: no conjunctival injection. no proptosis, lid lag, or stare.  HEENT: Ears are normal without deformaties. MMM  ENMT: Trachea is midline. Thyroid appears normal in size.    Pulm: Respirations are non-labored.   MSK: Normal gait.    Integumentary: Skin intact. No rashes.   Neuro: No focal deficits noted. no hand tremor.   Psych: Cooperative. Appropriate mood and affect.       Lab Review:  Lab Results   Component Value Date    HGBA1C 7.4 (H) 09/19/2019    HGBA1C 7.5 05/04/2019    HGBA1C 7.7 (H) 09/19/2018      Lab Results   Component Value Date    GLUCOSE 231 (H) 09/19/2019    ALBCREATUR  09/19/2019      Comment:      Unable to calculate. Test result is???below detection???limit.    CHOLDLQ 80 01/27/2018    CHOLDLCAL 50 09/19/2019    CPEPTIDE <0.2 (L) 09/03/2017      Lab Results   Component Value Date    CREAT 0.96 09/19/2019    BUN 18 09/19/2019    NA 132 (L) 09/19/2019    K 5.3 09/19/2019    CL 94 (L) 09/19/2019    CO2 26 09/19/2019      Lab Results   Component Value Date    WBC 6.12 09/19/2019    HGB 12.2 (L) 09/19/2019    HCT 37.1 (L) 09/19/2019    MCV 93.2 09/19/2019    PLT 256 09/19/2019      No results found for: OSMOLALITY   Lab Results Component Value Date    KETONESUR Negative 09/19/2019      No results found for: PHVEN, PHART   Lab Results   Component Value Date    CHOL 178 09/19/2019    CHOLHDL 115 09/19/2019    CHOLDLQ 80 01/27/2018    CHOLDLCAL 50 09/19/2019    TRIGLY 63 09/19/2019        Assessment/Plan:    74 y.o. male presenting to the [facility]  with the following issues:    1. Type 1 Diabetes Mellitus, uncontrolled   Lab Results   Component Value Date    HGBA1C 7.4 (H) 09/19/2019    HGBA1C 7.5 05/04/2019    HGBA1C 7.7 (H) 09/19/2018     Uncontrolled T1DM.  No known DM complications. Complicated by severe hypoglycemia.    A1C goal < 7.5%: to reduce risk of diabetes related complications to the eyes, feet and kidneys with additional goal of hypoglycemia risk reduction.  On CGM,  I noted PP hyperglycemia when not bolusing for meals. Hypoglycemia trends when bolusing full amount of insulin and not having his meal or having alcohol in the evenings. Based on CGM interpetration, I recommend 1) bolusing for all meals even when blood glucose is 101-150 mg/dL, he can consider doing 50% if is worried about hypoglycemia 2) needs to eat meals whenever he uses his scale as it is a combined scale to cover carbohydrates and high blood sugars 3) use 50% of insulin if he is going to have alcohol in the evenings with his meal 4) reduce basal insulin slightly.   -Please see new DM regimen below in patient instructions   -High frequency glucose monitoring with CGM   -Hypoglycemia protocol reviewed. ''Rule of 15'' handout given also. Carry glucose tabs at all times.   -Check glucose prior to driving. Glucose must be > 100 mg/dl. Reviewed risks related to driving with hypoglycemia   -Glucagon reviewed.  Baqsimi (glucagon) nasal spray if possible.  Ordered.  Waiting for coupon.     2. Hypertension   -Continue current regimen, including ACE-I inhibitor or ARB therapy    3. Hyperlipidemia   Per AHA guidelines, statin therapy is recommended with history of diabetes mellitus   -Continue statin therapy.    4. Diabetic microvascular disease screening:  Health Maintenance   Topic Date Due   ??? Diabetes: Dilated Eye Exam  02/14/2020   ??? Diabetes: Hemoglobin A1c Blood Test  03/18/2020   ??? Diabetes: Kidney Monitoring  09/18/2020     5. ETOH use.  Needs to stop or reduce to safe drinking.  Discuss further with PMD.     6. Sleep Disorder.  Dtr suspects OSA.  See PMD re: eval.    No orders of the defined types were placed in this encounter.    I spent 40 minutes counseling and coordinating care for the items checked below on the day of service  [x]  Preparing to see the patient (e.g., review of tests)  [x]  Obtaining and or reviewing separately obtained history   [x]  Performing a medically appropriate examination and/or evaluation   [x]  Counseling and educating the patient/family/caregiver   [x]  Ordering medications, tests, or procedures  []  Referring and communicating with other healthcare professionals (when not separately reported)  [x]  Documenting clinical information in the EHR  [x]  Independently interpreting results and communicating results to patient/ family/caregiver    Author: Erie Noe A. Noreene Filbert, MD 09/21/2019 10:08 AM     There are no Patient Instructions on file for this visit.

## 2019-09-24 ENCOUNTER — Ambulatory Visit: Payer: BLUE CROSS/BLUE SHIELD

## 2019-09-24 DIAGNOSIS — E871 Hypo-osmolality and hyponatremia: Secondary | ICD-10-CM

## 2019-09-28 MED ORDER — ONETOUCH ULTRA VI STRP
ORAL_STRIP | ORAL | 2 refills | 30.00000 days
Start: 2019-09-28 — End: ?

## 2019-09-28 MED ORDER — SIMVASTATIN 20 MG PO TABS
ORAL_TABLET | 2 refills
Start: 2019-09-28 — End: ?

## 2019-09-29 MED ORDER — SIMVASTATIN 20 MG PO TABS
ORAL_TABLET | 2 refills | Status: AC
Start: 2019-09-29 — End: ?

## 2019-09-29 MED ORDER — ONETOUCH ULTRA VI STRP
ORAL_STRIP | 2 refills | Status: AC
Start: 2019-09-29 — End: ?

## 2019-10-05 MED ORDER — FUROSEMIDE 20 MG PO TABS
ORAL_TABLET | 0 refills | Status: AC
Start: 2019-10-05 — End: ?

## 2019-10-06 MED ORDER — SILDENAFIL CITRATE 100 MG PO TABS
ORAL_TABLET | 10 refills | Status: AC
Start: 2019-10-06 — End: ?

## 2019-10-12 NOTE — H&P
PATIENT: Chad Watkins  MRN: [id number]  DOB: 03/10/1945  DATE OF SERVICE: 09/19/2019    CHIEF COMPLAINT:   Chief Complaint   Patient presents with   ??? Annual Exam     not fasting       Past Medical History:   Diagnosis Date   ??? Colon polyps-2020 09/19/2019   ??? Diabetic neuropathy (HCC/RAF) 01/27/2018   ??? Diabetic peripheral vascular disease (HCC/RAF) 09/03/2017   ??? DM type 1 with diabetic mixed hyperlipidemia (HCC/RAF) 09/03/2017   ??? Edema leg 07/14/2018   ??? History of fracture of left ankle 12/01/2017   ??? Hypertension associated with diabetes (HCC/RAF) 09/03/2017   ??? Long term (current) use of insulin (HCC/RAF) 09/19/2019   ??? Peripheral vascular disease (HCC/RAF) 09/03/2017   ??? Trigger thumb 01/27/2018   ??? Type 1 diabetes mellitus with diabetic neuropathy, unspecified (HCC/RAF) 09/03/2017       No past surgical history on file.    Social History     Tobacco Use   Smoking Status Never Smoker   Smokeless Tobacco Never Used       No family history on file.    Social History     Substance and Sexual Activity   Alcohol Use Yes   ??? Alcohol/week: 4.2 oz   ??? Types: 7 Standard drinks or equivalent per week       Social History     Substance and Sexual Activity   Drug Use Not on file       No Known Allergies    Patient Active Problem List   Diagnosis   ??? Bursitis   ??? Skin cancer   ??? Hyperlipidemia   ??? Palpitation   ??? Need for vaccination   ??? Healthcare maintenance   ??? Hypertension, well controlled   ??? Abnormal EKG   ??? Left ankle pain   ??? Hypertension associated with diabetes (HCC/RAF)   ??? DM type 1 with diabetic mixed hyperlipidemia (HCC/RAF)   ??? DM (diabetes mellitus), type 1, uncontrolled, periph vascular complic (HCC/RAF)   ??? Peripheral vascular disease (HCC/RAF)   ??? DM (diabetes mellitus), type 1, uncontrolled w/neurologic complication (HCC/RAF)   ??? History of fracture of left ankle   ??? Diabetic neuropathy (HCC/RAF)   ??? Trigger thumb   ??? Edema leg   ??? Mild nonproliferative diabetic retinopathy (HCC/RAF)   ??? Pseudophakia   ??? Phyisological cupping of optic disc, right   ??? Long term (current) use of insulin (HCC/RAF)   ??? Colon polyps-2020         Current Outpatient Medications:   ???  acetaminophen-codeine (TYLENOL #3) 300-30 mg tablet, TAKE 1 TABLET EVERY 4 HOURS AS NEEDED FOR PAIN, Disp: , Rfl: 0  ???  AMLODIPINE-BENAZEPRIL 5-20 mg capsule, TAKE 1 CAPSULE BY MOUTH TWO (2) TIMES DAILY., Disp: 180 capsule, Rfl: 0  ???  ciclopirox 8% solution, Apply topically at bedtime Apply over nail and surrounding skin.., Disp: 1 bottle, Rfl: 3  ???  FLUZONE HIGH-DOSE 0.5 ML syringe, , Disp: , Rfl:   ???  FUROSEMIDE 20 mg tablet, TOME UNA TABLETA TODOS LOS DIAS EN LA MANANA, Disp: 90 tablet, Rfl: 0  ???  gatifloxacin 0.5% ophthalmic solution, , Disp: , Rfl:   ???  glucagon (BAQSIMI TWO PACK) 3 mg/dose nasal powder, 1 actuation (3 mg total) by Left Nare route every fifteen (15) minutes as needed., Disp: 3 each, Rfl: 1  ???  glucose blood test strip, Use as instructed, Disp: 100 each, Rfl: 12  ???  insulin glargine (LANTUS) 100 units/mL injection vial, USE 5 UNITS EVERY MORNING AND 3 UNITS EVERY EVENING., Disp: 90 mL, Rfl: 1  ???  insulin lispro, HumaLOG, (HUMALOG) 100 unit/mL injection vial, 3 units sq qac plus insulin sliding scale patient has.  Hold for sugar <100., Disp: 90 mL, Rfl: 2  ???  insulin lispro, HumaLOG, (HUMALOG) 100 unit/mL injection vial, 3 UNITS EACH MEAL PLUS AMOUNT DETERMINED BY MD'S SLIDING SCALE; HOLD FOR SUGAR UNDER 100., Disp: 90 mL, Rfl: 1  ???  insulin syringe needle U-100 (BD INSULIN SYRINGE U/F) 31G X 5/16'' 0.3 mL syringe, Up to 7 times a day., Disp: 700 each, Rfl: 6  ???  LOTEMAX SM 0.38 % GEL, , Disp: , Rfl:   ???  ONETOUCH ULTRA test strip, USE 1 TEST STRIP TESTING 5 TIMES A DAY., Disp: 500 strip, Rfl: 2  ???  PROLENSA 0.07 % SOLN, , Disp: , Rfl:   ???  SHINGRIX 50 MCG/0.5ML injection, , Disp: , Rfl:   ???  SILDENAFIL 100 mg tablet, TOME UNA TABLETA TODOS LOS DIAS CUANDO SEA NECESARIO FOR ERECTILE DYSFUNCTION, Disp: 10 tablet, Rfl: 10  ???  simvastatin (ZOCOR) 20 mg tablet, TOME UNA TABLETA DIARIAMENTE, Disp: 90 tablet, Rfl: 3  ???  SIMVASTATIN 20 mg tablet, TOME UNA TABLETA TODOS LOS DIAS AL ACOSTARSE, Disp: 90 tablet, Rfl: 2    Health Maintenance   Topic Date Due   ??? Advance Directive  Never done   ??? Annual Preventive Wellness Visit  01/28/2019   ??? Influenza Vaccine (1) 09/20/2019   ??? Diabetes: EYE EXAM  02/14/2020   ??? Diabetes: HGB A1C  03/18/2020   ??? Diabetes: NEPHROPATHY MONITORING  09/18/2020   ??? Tdap/Td Vaccine (2 - Td) 07/19/2022   ??? Colorectal Cancer Screening  03/02/2023   ??? Pneumococcal Vaccine  Completed   ??? Hepatitis C Screening  Completed   ??? Shingles (Shingrix) Vaccine  Completed   ??? COVID-19 Vaccine  Completed   ??? Statin prescribed for ASCVD Prevention or Treatment  Completed       Patient Care Team:  Trena Platt., DO as PCP - General (Family Medicine)      Subjective:      Chad Watkins is a 74 y.o. male.    Review of Systems   Constitutional: Negative.  Negative for activity change, appetite change, chills, diaphoresis, fatigue, fever and unexpected weight change.   HENT: Negative.  Negative for congestion, dental problem, drooling, ear discharge, ear pain, facial swelling, hearing loss, mouth sores, nosebleeds, postnasal drip, rhinorrhea, sinus pressure, sinus pain, sneezing, sore throat, tinnitus, trouble swallowing and voice change.    Eyes: Negative.  Negative for photophobia, pain, discharge, redness, itching and visual disturbance.   Respiratory: Negative.  Negative for apnea, cough, choking, chest tightness, shortness of breath, wheezing and stridor.    Cardiovascular: Negative.  Negative for chest pain, palpitations and leg swelling.   Gastrointestinal: Negative.  Negative for abdominal distention, abdominal pain, anal bleeding, blood in stool, constipation, diarrhea, nausea, rectal pain and vomiting.   Endocrine: Negative.  Negative for cold intolerance, heat intolerance, polydipsia, polyphagia and polyuria.   Genitourinary: Negative.  Negative for decreased urine volume, difficulty urinating, discharge, dysuria, enuresis, flank pain, frequency, genital sores, hematuria, penile pain, penile swelling, scrotal swelling, testicular pain and urgency.   Musculoskeletal: Negative.  Negative for arthralgias, back pain, gait problem, joint swelling, myalgias, neck pain and neck stiffness.   Skin: Negative.  Negative for color change, pallor, rash and  wound.   Allergic/Immunologic: Negative.  Negative for environmental allergies, food allergies and immunocompromised state.   Neurological: Negative.  Negative for dizziness, tremors, seizures, syncope, facial asymmetry, speech difficulty, weakness, light-headedness, numbness and headaches.   Hematological: Negative.  Negative for adenopathy. Does not bruise/bleed easily.   Psychiatric/Behavioral: Negative.  Negative for agitation, behavioral problems, confusion, decreased concentration, dysphoric mood, hallucinations, self-injury, sleep disturbance and suicidal ideas. The patient is not nervous/anxious and is not hyperactive.    All other systems reviewed and are negative.        Objective:      Physical Exam  Vitals signs reviewed.   Constitutional:       General: He is not in acute distress.     Appearance: Normal appearance. He is well-developed and normal weight. He is not ill-appearing, toxic-appearing or diaphoretic.      Comments: BP 142/65  ~ Pulse 68  ~ Temp 36.6 ???C (97.9 ???F) (Forehead)  ~ Ht 5' 4.76'' (1.645 m)  ~ Wt 142 lb 3.2 oz (64.5 kg)  ~ BMI 23.84 kg/m???      HENT:      Head: Normocephalic and atraumatic.      Right Ear: Tympanic membrane, ear canal and external ear normal. There is no impacted cerumen.      Left Ear: Tympanic membrane, ear canal and external ear normal. There is no impacted cerumen.      Nose: Nose normal. No congestion or rhinorrhea.      Mouth/Throat:      Mouth: Mucous membranes are moist.      Pharynx: Oropharynx is clear. No oropharyngeal exudate or posterior oropharyngeal erythema.   Eyes:      General: No scleral icterus.        Right eye: No discharge.         Left eye: No discharge.      Extraocular Movements: Extraocular movements intact.      Conjunctiva/sclera: Conjunctivae normal.      Pupils: Pupils are equal, round, and reactive to light.   Neck:      Musculoskeletal: Normal range of motion and neck supple. No neck rigidity or muscular tenderness.      Thyroid: No thyromegaly.      Vascular: No carotid bruit.   Cardiovascular:      Rate and Rhythm: Normal rate and regular rhythm.      Pulses: Normal pulses.      Heart sounds: Normal heart sounds. No murmur. No friction rub. No gallop.    Pulmonary:      Effort: Pulmonary effort is normal. No respiratory distress.      Breath sounds: Normal breath sounds. No stridor. No wheezing, rhonchi or rales.   Chest:      Chest wall: No tenderness.   Abdominal:      General: Bowel sounds are normal. There is no distension.      Palpations: Abdomen is soft. There is no mass.      Tenderness: There is no abdominal tenderness. There is no right CVA tenderness, left CVA tenderness, guarding or rebound.      Hernia: No hernia is present.   Musculoskeletal: Normal range of motion.         General: No swelling, tenderness, deformity or signs of injury.      Right lower leg: No edema.      Left lower leg: No edema.   Lymphadenopathy:      Cervical: No cervical adenopathy.   Skin:  General: Skin is warm and dry.      Capillary Refill: Capillary refill takes less than 2 seconds.      Coloration: Skin is not jaundiced or pale.      Findings: No bruising, erythema, lesion or rash.   Neurological:      General: No focal deficit present.      Mental Status: He is alert and oriented to person, place, and time.      Cranial Nerves: No cranial nerve deficit.      Sensory: No sensory deficit.      Motor: No weakness or abnormal muscle tone.      Coordination: Coordination normal.      Gait: Gait normal.      Deep Tendon Reflexes: Reflexes normal.   Psychiatric:         Mood and Affect: Mood normal.         Behavior: Behavior normal.         Thought Content: Thought content normal.         Judgment: Judgment normal.             ASSESSMENT AND PLAN     1. Healthcare maintenance    2. Diabetic polyneuropathy associated with type 1 diabetes mellitus (HCC/RAF)    3. DM (diabetes mellitus), type 1, uncontrolled w/neurologic complication (HCC/RAF)    4. DM (diabetes mellitus), type 1, uncontrolled, periph vascular complic (HCC/RAF)    5. DM type 1 with diabetic mixed hyperlipidemia (HCC/RAF)    6. Hypertension associated with diabetes (HCC/RAF)    7. 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 Long term (current) use of insulin (HCC/RAF)    9. Polyp of colon, unspecified part of colon, unspecified type    10. Type 1 diabetes mellitus with peripheral vascular disease (HCC/RAF)    11. Preventative health care        Orders Placed This Encounter   ??? TBOC - Venipuncture w/Collection & Handling   ??? Albumin/Creat Ratio Ur   ??? CBC & Auto Differential   ??? Folate,Serum   ??? Hgb A1c   ??? Lipid Panel   ??? Magnesium   ??? PTH, Intact   ??? TSH with reflex FT4, FT3   ??? Urinalysis w/Reflex to Culture   ??? Vitamin B12   ??? Vitamin D,25-Hydroxy   ??? Uric Acid   ??? Comprehensive Metabolic Panel   ??? PSA,Free & Total Profile   ??? Testosterone, Free, Total and  SHBG, Men   ??? UA,Dipstick   ??? UA,Microscopic   ??? CBC   ??? Differential, Automated   ??? Referral to Podiatry   ??? insulin lispro, HumaLOG, (HUMALOG) 100 unit/mL injection vial   ??? insulin lispro, HumaLOG, (HUMALOG) 100 unit/mL injection vial       Wt Readings from Last 5 Encounters:   09/21/19 142 lb (64.4 kg)   09/19/19 142 lb 3.2 oz (64.5 kg)   07/13/19 153 lb (69.4 kg)   11/23/18 152 lb 9.6 oz (69.2 kg)   10/20/18 154 lb (69.9 kg)     BP Readings from Last 5 Encounters:   09/21/19 115/51   09/19/19 142/65   07/13/19 122/62   11/23/18 112/49   10/20/18 91/45       Hgb A1c - HPLC   Date Value Ref Range Status   09/19/2019 7.4 (H) <5.7 % Final     Comment:  For patients with diabetes, an A1c less than (<) or equal (=) to 7.0% is recommended for most patients, however the goal may be higher or lower depending on age and/or other medical problems.   For a diagnosis of diabetes, A1c greater than (>) or equal(=) to 6.5% indicates diabetes; values between 5.7% and 6.4% may indicate an increased risk of developing diabetes.   09/19/2018 7.7 (H) <5.7 % Final     Comment:     For patients with diabetes, an A1c less than (<) or equal (=) to 7.0% is recommended for most patients, however the goal may be higher or lower depending on age and/or other medical problems.   For a diagnosis of diabetes, A1c greater than (>) or equal(=) to 6.5% indicates diabetes; values between 5.7% and 6.4% may indicate an increased risk of developing diabetes.   07/14/2018 7.5 (H) <5.7 % Final     Comment:     For patients with diabetes, an A1c less than (<) or equal (=) to 7.0% is recommended for most patients, however the goal may be higher or lower depending on age and/or other medical problems.   For a diagnosis of diabetes, A1c greater than (>) or equal(=) to 6.5% indicates diabetes; values between 5.7% and 6.4% may indicate an increased risk of developing diabetes.     Hemoglobin A1C, Manual   Date Value Ref Range Status   05/04/2019 7.5 (Ref Range:4.2-6.5%) Note: For patients with diabetes, an A1c less than (<) or equal (=) to 7.0% is recommended for most patients, however the goal may be higher or lower depending on age and/or other medical problems. % Final     Cholesterol   Date Value Ref Range Status   09/19/2019 178 See Comment mg/dL Final     Comment:     The significance of total cholesterol depends on the values of LDL, HDL, triglycerides and the clinical context. A patient-provider discussion may be considered.       09/19/2018 184 See Comment mg/dL Final Comment:     The significance of total cholesterol depends on the values of LDL, HDL, triglycerides and the clinical context. A patient-provider discussion may be considered.       07/14/2018 165 See Comment mg/dL Final     Comment:     The significance of total cholesterol depends on the values of LDL, HDL, triglycerides and the clinical context. A patient-provider discussion may be considered.         Cholesterol,LDL,Calc   Date Value Ref Range Status   09/19/2019 50 <100 mg/dL Final     Comment:     Patient is non-fasting, interpret with caution.  If LDL value falls outside of the designated range AND if  included in any of the following categories, a  patient-provider discussion is recommended.     Statin therapy is recommended for individuals:  1. with clinical atherosclerotic cardiovascular disease     irrespective of LDL levels;  2. with LDL > or = 190 mg/dL;  3. with diabetes, aged 40-75 years, with LDL between 70 and     189 mg/dL;  4. without any of the above but who have LDL between 70 and     189 mg/dL and an estimated 16-XWRU risk of     atherosclerotic cardiovascular disease > or = 7.5%     (consider statin therapy if estimated 10-year risk > or =     5.0%) (ACC/AHA 2013 Guidelines).   09/19/2018 66 <100 mg/dL  Final     Comment:     If LDL value falls outside of the designated range AND if  included in any of the following categories, a  patient-provider discussion is recommended.     Statin therapy is recommended for individuals:  1. with clinical atherosclerotic cardiovascular disease     irrespective of LDL levels;  2. with LDL > or = 190 mg/dL;  3. with diabetes, aged 40-75 years, with LDL between 70 and     189 mg/dL;  4. without any of the above but who have LDL between 70 and     189 mg/dL and an estimated 45-WUJW risk of     atherosclerotic cardiovascular disease > or = 7.5%     (consider statin therapy if estimated 10-year risk > or =     5.0%) (ACC/AHA 2013 Guidelines).   07/14/2018 47 <100 mg/dL Final     Comment:     If LDL value falls outside of the designated range AND if  included in any of the following categories, a  patient-provider discussion is recommended.     Statin therapy is recommended for individuals:  1. with clinical atherosclerotic cardiovascular disease     irrespective of LDL levels;  2. with LDL > or = 190 mg/dL;  3. with diabetes, aged 40-75 years, with LDL between 70 and     189 mg/dL;  4. without any of the above but who have LDL between 70 and     189 mg/dL and an estimated 11-BJYN risk of     atherosclerotic cardiovascular disease > or = 7.5%     (consider statin therapy if estimated 10-year risk > or =     5.0%) (ACC/AHA 2013 Guidelines).     Cholesterol, HDL   Date Value Ref Range Status   09/19/2019 115 >40 mg/dL Final     Comment:     If HDL cholesterol level falls outside of the designated  range, a patient-provider discussion is recommended   09/19/2018 105 >40 mg/dL Final     Comment:     If HDL cholesterol level falls outside of the designated  range, a patient-provider discussion is recommended   07/14/2018 104 >40 mg/dL Final     Comment:     If HDL cholesterol level falls outside of the designated  range, a patient-provider discussion is recommended     Triglycerides   Date Value Ref Range Status   09/19/2019 63 <150 mg/dL Final     Comment:     Patient is non-fasting, interpret with caution.  If Triglyceride level falls outside of the designated range,  a patient-provider discussion is recommended.     09/19/2018 66 <150 mg/dL Final     Comment:     If Triglyceride level falls outside of the designated range,  a patient-provider discussion is recommended.     07/14/2018 70 <150 mg/dL Final     Comment:     If Triglyceride level falls outside of the designated range,  a patient-provider discussion is recommended.        Creatinine   Date Value Ref Range Status   09/19/2019 0.96 0.60 - 1.30 mg/dL Final   82/95/6213 0.86 0.60 - 1.30 mg/dL Final   57/84/6962 9.52 0.60 - 1.30 mg/dL Final        PHQ-9 Results  Depression Screening (Patient Health Questionnaire PHQ) 10/19/2014 10/19/2014 09/11/2015 12/24/2016 01/22/2017 01/27/2018 09/19/2019   PHQ-2: Feeling down, depressed, or hopeless No No No No  No No No   PHQ-2: Little interest or pleassure in doing things No No No No No No No   Little interest or pleasure in doing things - - - - - - -   Feeling down, depressed, or hopeless - - - - - - -   Total Score - - - - - - -   Some recent data might be hidden       GAD-7 Results  No flowsheet data found.    DAST Results  No flowsheet data found.    Audit-C results  No flowsheet data found.      No follow-ups on file.  The above plan of care, diagnosis, orders, and follow-up were discussed with the patient.  Questions related to this recommended plan of care were answered.  Lonzo Cloud. Karolee Ohs, DO  10/12/2019    Author:  Lonzo Cloud. Javan Gonzaga 10/12/2019 3:06 PM

## 2019-10-23 ENCOUNTER — Ambulatory Visit: Payer: BLUE CROSS/BLUE SHIELD

## 2019-10-25 MED ORDER — METFORMIN HCL ER 500 MG PO TB24
1000 mg | ORAL_TABLET | Freq: Two times a day (BID) | ORAL | 3 refills
Start: 2019-10-25 — End: ?

## 2019-10-25 NOTE — Telephone Encounter
Received [employer] form for Dr. Rosiland Oz to sign for patient dexcom supplies. Left form in Dr. Rosiland Oz box to sign.

## 2019-10-26 NOTE — Telephone Encounter
Faxed signed [employer] form by Dr. Rosiland Oz

## 2019-10-27 MED ORDER — METFORMIN HCL ER 500 MG PO TB24
1000 mg | ORAL_TABLET | Freq: Two times a day (BID) | ORAL | 3 refills
Start: 2019-10-27 — End: ?

## 2019-10-31 MED ORDER — METFORMIN HCL ER 500 MG PO TB24
500 mg | Freq: Every day | ORAL
Start: 2019-10-31 — End: ?

## 2019-11-14 ENCOUNTER — Telehealth: Payer: BLUE CROSS/BLUE SHIELD

## 2019-11-14 ENCOUNTER — Ambulatory Visit: Payer: BLUE CROSS/BLUE SHIELD

## 2019-11-14 DIAGNOSIS — S61212A Laceration without foreign body of right middle finger without damage to nail, initial encounter: Principal | ICD-10-CM

## 2019-11-14 NOTE — Telephone Encounter
Call Back Request      Reason for call back:  Pt's daughter requesting call back in regards to rx refill request for metformin.  rx is not in current list of medications for pt.  Pt's daughter states pt is all out of rx and needs refill as soon as possible.      cbn [phone]    Any Symptoms:  []  Yes  [x]  No      ? If yes, what symptoms are you experiencing:    o Duration of symptoms (how long):    o Have you taken medication for symptoms (OTC or Rx):      Patient or caller has been notified of the 24-48 hour turnaround time.

## 2019-11-15 ENCOUNTER — Telehealth: Payer: BLUE CROSS/BLUE SHIELD

## 2019-11-15 ENCOUNTER — Inpatient Hospital Stay
Admit: 2019-11-15 | Discharge: 2019-11-15 | Disposition: A | Discharge status: Home / Self Care | Payer: BLUE CROSS/BLUE SHIELD | Source: Home / Self Care

## 2019-11-15 MED ORDER — COTRIMOXAZOLE 800-160 MG PO TABS
1 | ORAL_TABLET | Freq: Two times a day (BID) | ORAL | 0 refills | Status: AC
Start: 2019-11-15 — End: ?

## 2019-11-15 MED ORDER — CEPHALEXIN 500 MG PO CAPS
500 mg | ORAL_CAPSULE | Freq: Four times a day (QID) | ORAL | 0 refills | Status: AC
Start: 2019-11-15 — End: ?

## 2019-11-15 MED ADMIN — BACITRACIN ZINC 500 UNIT/GM EX OINT: TOPICAL | @ 06:00:00 | Stop: 2019-11-15

## 2019-11-15 MED ADMIN — LIDOCAINE HCL (PF) 1 % IJ SOLN: 5 mL | INTRADERMAL | @ 06:00:00 | Stop: 2019-11-15

## 2019-11-15 MED ADMIN — CEFAZOLIN SODIUM 1 G IJ SOLR: 1 g | INTRAMUSCULAR | @ 06:00:00 | Stop: 2019-11-15 | NDC 00143992490

## 2019-11-15 MED ADMIN — DOUBLE ANTIBIOTIC 500-10000 UNIT/GM EX OINT: TOPICAL | @ 06:00:00 | Stop: 2019-11-15

## 2019-11-15 MED ADMIN — TETANUS-DIPHTH-ACELL PERTUSSIS 5-2.5-18.5 LF-MCG/0.5 IM SUSP: .5 mL | INTRAMUSCULAR | @ 05:00:00 | Stop: 2019-11-15 | NDC 58160084252

## 2019-11-15 NOTE — Telephone Encounter
Call Back Request      Reason for call back: Pt has a finger laceration possible tendon nerve  Can they be added this week??    Please advise and contact    Thank you        Any Symptoms:  []  Yes  [x]  No      ? If yes, what symptoms are you experiencing:    o Duration of symptoms (how long):    o Have you taken medication for symptoms (OTC or Rx):      Patient or caller has been notified of the 24-48 hour turnaround time.

## 2019-11-15 NOTE — ED Provider Notes
[facility]  Emergency Department Service Report    Chad Watkins 74 y.o. male , presents with Fall      Triage   Arrived on 11/14/2019 at 10:03 PM   Arrived by Walk-in [14]    ED Triage Vitals   Temp Temp Source BP Heart Rate Resp SpO2 O2 Device Pain Score Weight   11/14/19 2217 11/14/19 2217 11/14/19 2217 11/14/19 2217 11/14/19 2217 11/14/19 2217 11/14/19 2217 -- 11/14/19 2205   36.2 ???C (97.2 ???F) Oral 124/69 72 20 96 % None (Room air)  64.4 kg (142 lb)       Pre hospital care:       No Known Allergies    History   Patient is a 74 y.o. male with hx of DM1, HTN, PVD who presents to the ED with complaint of right hand pain and laceration in the setting of mechanical fall while intoxicated. Pt denies LOC. Pain is 2/10, dull, constant, worse with touch or movement, radiation throughout the right hand/finger. Also admits to drinking earlier tonight. Unknown tetanus.     The history is provided by the patient and medical records. No language interpreter was used.            Past Medical History:   Diagnosis Date   ??? Colon polyps-2020 09/19/2019   ??? Diabetic neuropathy (HCC/RAF) 01/27/2018   ??? Diabetic peripheral vascular disease (HCC/RAF) 09/03/2017   ??? DM type 1 with diabetic mixed hyperlipidemia (HCC/RAF) 09/03/2017   ??? Edema leg 07/14/2018   ??? History of fracture of left ankle 12/01/2017   ??? Hypertension associated with diabetes (HCC/RAF) 09/03/2017   ??? Long term (current) use of insulin (HCC/RAF) 09/19/2019   ??? Peripheral vascular disease (HCC/RAF) 09/03/2017   ??? Trigger thumb 01/27/2018   ??? Type 1 diabetes mellitus with diabetic neuropathy, unspecified (HCC/RAF) 09/03/2017        History reviewed. No pertinent surgical history.     Past Family History   Family history reviewed by me and there is no pertinent past family history related to the patient's current case and/or care.     Past Social History   he reports that he has never smoked. He has never used smokeless tobacco. He reports current alcohol use of about 4.2 oz of alcohol per week. No history on file for drug and sexual activity.     Review of Systems   Musculoskeletal: Positive for arthralgias.        R hand injury with laceration/abrasions s/p fall   Skin: Positive for wound.   Psychiatric/Behavioral:        EtOH intoxication   All other systems reviewed and are negative.    Physical Exam   Physical Exam  Vitals signs and nursing note reviewed.   Constitutional:       Appearance: He is well-developed.      Comments: Appears intoxicated   HENT:      Head: Normocephalic and atraumatic.   Eyes:      Conjunctiva/sclera: Conjunctivae normal.      Pupils: Pupils are equal, round, and reactive to light.   Neck:      Musculoskeletal: Normal range of motion and neck supple.   Cardiovascular:      Rate and Rhythm: Normal rate and regular rhythm.      Heart sounds: Normal heart sounds.   Pulmonary:      Effort: Pulmonary effort is normal.      Breath sounds: Normal breath sounds.  Abdominal:      General: Bowel sounds are normal.      Palpations: Abdomen is soft.   Musculoskeletal: Normal range of motion.   Skin:     General: Skin is warm and dry.      Comments: Right hand 3rd digit with 1 cm laceration lateral to PIP, partial tendon involvement.  Abrasion over other knuckles of right hand  FROM of all joints, DNVI   Neurological:      Mental Status: He is alert and oriented to person, place, and time.      Deep Tendon Reflexes: Reflexes are normal and symmetric.   Psychiatric:         Speech: Speech is slurred.         Behavior: Behavior normal.         Judgment: Judgment normal.       ED Course          Laboratory Results   Labs Reviewed - No data to display    Imaging Results     XR finger pa+lat+obl right (3 views)   Final Result by Charma Igo., DO (10/26 2242)   IMPRESSION:      No acute fracture or dislocation. Soft tissue lucency at the dorsal aspect of the third proximal interphalangeal joints compatible with laceration. Mild soft tissue swelling about the proximal interphalangeal joint.      I, Roylene Reason, D.O., have reviewed this radiological study personally and I am in full agreement with the findings of the report presented here      Dictated by: Sampson Goon   11/14/2019 10:38 PM      Signed by: Roylene Reason   11/14/2019 10:42 PM          Administered Medications     Medication Administration from 11/14/2019 2203 to 11/14/2019 2338       Date/Time Order Dose Route Action Action by Comments     11/14/2019 2225 Tdap vaccine (Boostrix) inj 0.5 mL 0.5 mL Intramuscular Given Astrid Drafts [place], [place]      11/14/2019 2236 ceFAZolin inj 1 g 1 g Intramuscular Given Juan Quam, [place]      11/14/2019 2254 lidocaine PF 1% inj 5 mL 5 mL Intradermal Given by Other Juan Quam, RN      11/14/2019 2315 bacitracin-polymyxin b topical oint   Topical Not Given Juan Quam, RN other [facility] documentation completed     11/14/2019 2254 bacitracin 500 unit/g ointment   Topical Given by Other Fabio Asa, Isaac Bliss, RN           Procedures   Procedural Sedation  Lac Repair    Date/Time: 11/14/2019 10:53 PM  Performed by: Cyndie Mull., MD  Authorized by: Cyndie Mull., MD   Consent: Verbal consent obtained.  Risks and benefits: risks, benefits and alternatives were discussed  Consent given by: patient  Patient understanding: patient states understanding of the procedure being performed  Patient consent: the patient's understanding of the procedure matches consent given  Body area: upper extremity  Location details: right long finger  Laceration length: 1.5 cm  Foreign bodies: no foreign bodies  Tendon involvement: superficial  Anesthesia: local infiltration    Anesthesia:  Local Anesthetic: lidocaine 1% without epinephrine  Preparation: Patient was prepped and draped in the usual sterile fashion.  Irrigation solution: saline  Irrigation method: syringe  Amount of cleaning: standard  Skin closure: 4-0 nylon  Number of sutures: 4  Approximation: close  Approximation difficulty: simple  Patient tolerance: patient tolerated the procedure well with no immediate complications  Comments: Tendon visible        MDM  Number of Diagnoses or Management Options     Amount and/or Complexity of Data Reviewed  Tests in the radiology section of CPT???: ordered and reviewed  Decide to obtain previous medical records or to obtain history from someone other than the patient: yes  Review and summarize past medical records: yes  Independent visualization of images, tracings, or specimens: yes           Prelim read Interpreted by Berenda Morale, MD  XR right finger (3 view) shows no evidence of obvious fracture, soft tissue swelling around PIP with lucency at dorsal aspect c/w laceration.  Interpretation: negative for acute fracture or dislocation.        23:26: Laceration repair tolerated well. I recommended patient follow up with ortho hand given laceration tendon involvement. Pt acutely intoxicated but verbalizes understanding.      Data Reviewed/Counseling: I have reviewed the patient's vital signs, nursing notes and old medical records. I had a detailed discussion with the patient regarding the historical points, exam findings, and any diagnostic results supporting the discharge diagnosis. I also discussed lab results, the need for outpatient follow-up and the need to return to the ED if symptoms worsen or if there are any questions or concerns that arise at home.     Clinical Impression     1. Laceration of right middle finger with tendon involvement        Prescriptions     New Prescriptions    No medications on file       Disposition and Follow-up   Disposition:     Future Appointments   Date Time Provider Department Center   02/13/2020 10:15 AM Loleta Dicker I., OD [facility]   04/25/2020 10:30 AM Lubertha Sayres., MD [phone] MEDICINE       Follow up with:  No follow-up provider specified.    Return precautions are specified on After Visit Summary.    The documentation on this chart was performed by Cleophas Dunker Fatih Stalvey, scribed for Cyndie Mull., MD    11/14/2019 10:23 PM   ***

## 2019-11-16 ENCOUNTER — Ambulatory Visit: Payer: BLUE CROSS/BLUE SHIELD | Attending: Hand Surgery

## 2019-11-16 ENCOUNTER — Ambulatory Visit: Payer: BLUE CROSS/BLUE SHIELD

## 2019-11-16 DIAGNOSIS — S61222A Laceration with foreign body of right middle finger without damage to nail, initial encounter: Secondary | ICD-10-CM

## 2019-11-16 MED ORDER — METFORMIN HCL ER 500 MG PO TB24
1000 mg | ORAL_TABLET | Freq: Two times a day (BID) | ORAL | 3 refills | Status: AC
Start: 2019-11-16 — End: ?

## 2019-11-16 NOTE — Telephone Encounter
PDL Call to Practice    Reason for Call: Daughter, Wells Guiles, following up on Pt's metformin medication refill.    Appointment Related?  []  Yes  [x]  No     If yes;  Date:  Time:    Call warm transferred to PDL: [x]  Yes  []  No    Call Received by Practice Representative: Colletta [place]

## 2019-11-16 NOTE — Consults
DATE OF SERVICE:  11/16/2019     This 74 year old fell 2 days ago and sustained a laceration of his right middle finger, the dorsum of the PIP joint, hand, and some abrasions in other areas, 1 on the left middle finger dorsum. He was seen and evaluated and the wound was closed, and he was noted to have some tendon involvement on the right side. It was redressed today together with the 1 on the other middle finger, and will see him in a week for followup.     He has been seen here for diabetic neuropathy, hypertension, colon polyps, peripheral vascular disease, pseudophakia.     He has no known allergies.      Lorin Glass, MD [phone])        JL/MODL CONF#: [id number]  D: 11/16/2019 11:03:50 T: 11/16/2019 11:48:40 DOCUMENT: [id number]

## 2019-11-16 NOTE — Telephone Encounter
Unfortunately, no - I am already at [facility]. After today's clinic, simply too many pending cases to see another patient with possible urgent issue. Will need to seek care with another provider.   Thanks,   Beulah Gandy

## 2019-11-16 NOTE — Telephone Encounter
Please advise 

## 2019-11-16 NOTE — Telephone Encounter
Patient scheduled with Dr. Lawrence

## 2019-11-16 NOTE — Telephone Encounter
Reply by: Laurance Flatten  Dr. Renelda Mom is not taking trauma patients this week, had a family emergency.

## 2019-11-23 ENCOUNTER — Ambulatory Visit: Payer: BLUE CROSS/BLUE SHIELD | Attending: Hand Surgery

## 2019-11-23 DIAGNOSIS — S61213D Laceration without foreign body of left middle finger without damage to nail, subsequent encounter: Secondary | ICD-10-CM

## 2019-11-24 NOTE — Progress Notes
DATE OF SERVICE:  11/23/2019     This 74 year old lacerated the dorsum of his right hand with some abrasions as well, but the laceration was at the dorsum of the PIP joint of the middle finger. This is healing satisfactorily now, although his daughter says there was some redness about it at one point. I see no evidence of infection. I am going to have the sutures removed today, put a soft dressing on that will limit his motion for another 4 or 5 days, and then make an appointment to see him for followup in about 10 days and if he is doing fine, they do not have to come in.      Lorin Glass, MD [phone])        JL/MODL CONF#: [id number]  D: 11/23/2019 11:57:05 T: 11/24/2019 03:10:39 DOCUMENT: [id number]

## 2019-11-26 MED ORDER — METFORMIN HCL ER 500 MG PO TB24
500 mg | Freq: Every day | ORAL
Start: 2019-11-26 — End: ?

## 2019-12-02 ENCOUNTER — Ambulatory Visit: Payer: BLUE CROSS/BLUE SHIELD

## 2019-12-09 MED ORDER — AMLODIPINE BESY-BENAZEPRIL HCL 5-20 MG PO CAPS
1 | ORAL_CAPSULE | Freq: Two times a day (BID) | ORAL | 0 refills | 60.00000 days
Start: 2019-12-09 — End: ?

## 2019-12-11 MED ORDER — AMLODIPINE BESY-BENAZEPRIL HCL 5-20 MG PO CAPS
1 | ORAL_CAPSULE | Freq: Two times a day (BID) | ORAL | 0 refills | Status: AC
Start: 2019-12-11 — End: ?

## 2019-12-29 MED ORDER — FUROSEMIDE 20 MG PO TABS
ORAL_TABLET | 0 refills | Status: AC
Start: 2019-12-29 — End: ?

## 2020-01-09 ENCOUNTER — Telehealth: Payer: BLUE CROSS/BLUE SHIELD

## 2020-01-09 ENCOUNTER — Ambulatory Visit: Payer: BLUE CROSS/BLUE SHIELD

## 2020-01-09 DIAGNOSIS — L603 Nail dystrophy: Secondary | ICD-10-CM

## 2020-01-09 DIAGNOSIS — B351 Tinea unguium: Secondary | ICD-10-CM

## 2020-01-09 DIAGNOSIS — M79675 Pain in left toe(s): Secondary | ICD-10-CM

## 2020-01-09 DIAGNOSIS — M79674 Pain in right toe(s): Secondary | ICD-10-CM

## 2020-01-09 NOTE — Telephone Encounter
Spoke with daughter Dr. Roland Earl is out of the office. Per daughter, patient feels better, and if he doesn't they will take him to [facility]/ED. Patient scheduled on 01/17/20 with Dr. Meda Coffee to be access

## 2020-01-09 NOTE — Telephone Encounter
Call Back Request      Reason for call back: Pt's daughter called in stating the pt passed out as he was exiting the restroom. An ambulance was called but pt refused to go to the hospital as he was coherent. Pt's daughter is concerned as this has been happening more frequently and she is requesting a call back to discuss further. Please assist.    Any Symptoms:  []  Yes  [x]  No       If yes, what symptoms are you experiencing:    o Duration of symptoms (how long):    o Have you taken medication for symptoms (OTC or Rx):      Patient or caller has been notified of the 24-48 hour turnaround time. Yes, urgent.

## 2020-01-09 NOTE — Progress Notes
CHIEF COMPLAINT   Nails and unstable gait    HISTORY OF PRESENT ILLNESS   Chad Watkins is a 74 y.o. year old male who presents with worsening thickness and pain in the nails of his feet.  he states that the nails have been discolored and thickened for several years.  The pain is worse when ambulating in enclosed shoe gear.  he denies any active bleeding or drainage of the nails.    PAST MEDICAL HISTORY     Past Medical History:   Diagnosis Date   ??? Colon polyps-2020 09/19/2019   ??? Diabetic neuropathy (HCC/RAF) 01/27/2018   ??? Diabetic peripheral vascular disease (HCC/RAF) 09/03/2017   ??? DM type 1 with diabetic mixed hyperlipidemia (HCC/RAF) 09/03/2017   ??? Edema leg 07/14/2018   ??? History of fracture of left ankle 12/01/2017   ??? Hypertension associated with diabetes (HCC/RAF) 09/03/2017   ??? Long term (current) use of insulin (HCC/RAF) 09/19/2019   ??? Peripheral vascular disease (HCC/RAF) 09/03/2017   ??? Trigger thumb 01/27/2018   ??? Type 1 diabetes mellitus with diabetic neuropathy, unspecified (HCC/RAF) 09/03/2017       MEDICATIONS     Outpatient Medications Prior to Visit   Medication Sig   ??? acetaminophen-codeine (TYLENOL #3) 300-30 mg tablet TAKE 1 TABLET EVERY 4 HOURS AS NEEDED FOR PAIN   ??? AMLODIPINE-BENAZEPRIL 5-20 mg capsule TAKE 1 CAPSULE BY MOUTH TWO (2) TIMES DAILY.   ??? cephalexin 500 mg capsule Take 1 capsule (500 mg total) by mouth four (4) times daily.   ??? ciclopirox 8% solution Apply topically at bedtime Apply over nail and surrounding skin.Marland Kitchen   ??? FLUZONE HIGH-DOSE 0.5 ML syringe    ??? FUROSEMIDE 20 mg tablet TOME UNA TABLETA TODOS LOS DIAS EN LA MANANA   ??? gatifloxacin 0.5% ophthalmic solution    ??? glucagon (BAQSIMI TWO PACK) 3 mg/dose nasal powder 1 actuation (3 mg total) by Left Nare route every fifteen (15) minutes as needed.   ??? glucose blood test strip Use as instructed   ??? insulin glargine (LANTUS) 100 units/mL injection vial USE 5 UNITS EVERY MORNING AND 3 UNITS EVERY EVENING.   ??? insulin lispro, HumaLOG, (HUMALOG) 100 unit/mL injection vial 3 units sq qac plus insulin sliding scale patient has.  Hold for sugar <100.   ??? insulin lispro, HumaLOG, (HUMALOG) 100 unit/mL injection vial 3 UNITS EACH MEAL PLUS AMOUNT DETERMINED BY MD'S SLIDING SCALE; HOLD FOR SUGAR UNDER 100.   ??? insulin syringe needle U-100 (BD INSULIN SYRINGE U/F) 31G X 5/16'' 0.3 mL syringe Up to 7 times a day.   ??? LOTEMAX SM 0.38 % GEL    ??? metFORMIN (GLUCOPHAGE XR) 500 mg PO ER 24 hr tablet Take 2 tablets (1,000 mg total) by mouth two (2) times daily with meals.   ??? ONETOUCH ULTRA test strip USE 1 TEST STRIP TESTING 5 TIMES A DAY.   ??? PROLENSA 0.07 % SOLN    ??? SHINGRIX 50 MCG/0.5ML injection    ??? SILDENAFIL 100 mg tablet TOME UNA TABLETA TODOS LOS DIAS CUANDO SEA NECESARIO FOR ERECTILE DYSFUNCTION   ??? simvastatin (ZOCOR) 20 mg tablet TOME UNA TABLETA DIARIAMENTE   ??? SIMVASTATIN 20 mg tablet TOME UNA TABLETA TODOS LOS DIAS AL ACOSTARSE     No facility-administered medications prior to visit.       ALLERGIES   No Known Allergies    SURGICAL HISTORY   No past surgical history on file.    ROS  General ROS: negative    PHYSICAL EXAM   There were no vitals filed for this visit.  GEN: no acute distress  VASC:  Dorsalis Pedis:  present  Posterior Tibial:  present  There is brisk capillary refill time to all digits less than 3 seconds.   varicosities absent Bilateral  edema is absent Bilateral  ecchymosis is not observed  DERM:   Skin is warm to touch  Skin tone and turgor are adequate.  No abrasions or excoriations are noted  NAILS:   The following nails are thick and dystrophic, they harbor subungual debris and hyperkeratosis. They are tender with direct palpation of the nail plate.       Left      Right      1 2 3 4 5  1 2 3 4 5    Nails [x]  [x]  [x]  [x]  [x]   [x]  [x]  [x]  [x]  [x]       No active bleeding or drainage is noted.  No clinical signs of infection.  NEURO:  protective sensation is intact to the digits.  Vibratory sensation is diminished. MSK:  Muscle strength 5/5 in all 4 quadrants.    Ankle ROM is normal, Subtalar joint ROM is normal.   There is tenderness to palpation of the nail plate.    ASSESSMENT/PLAN   Chad Watkins is a 74 y.o. male who presents with:  Onychomycosis with pain  ??? Manual and mechanical debridement of all 10 toenails done in the office without complications.  Pt will continue to soak for any lingering toenail discomfort and f/u in the office as needed.   ??? We discussed treatment for onychomycosis including topicals, lasers, nail avulsions and oral medication.  ??? Discussed pros and cons of oral Lamisil treatment including all side effects and potentially severe liver complications  ??? Pt elects deb of nails x 10  Diabetic shoes with accomodative inserts for support and balance.  ??? F/u prn       Chad Watkins [name], DPM  01/09/2020

## 2020-01-17 ENCOUNTER — Ambulatory Visit: Payer: BLUE CROSS/BLUE SHIELD

## 2020-01-31 ENCOUNTER — Ambulatory Visit: Payer: BLUE CROSS/BLUE SHIELD

## 2020-02-13 ENCOUNTER — Ambulatory Visit: Payer: BLUE CROSS/BLUE SHIELD

## 2020-02-13 DIAGNOSIS — E103292 Type 1 diabetes mellitus with mild nonproliferative diabetic retinopathy without macular edema, left eye: Secondary | ICD-10-CM

## 2020-02-13 DIAGNOSIS — E871 Hypo-osmolality and hyponatremia: Secondary | ICD-10-CM

## 2020-02-13 DIAGNOSIS — E1042 Type 1 diabetes mellitus with diabetic polyneuropathy: Secondary | ICD-10-CM

## 2020-02-13 DIAGNOSIS — R55 Syncope and collapse: Secondary | ICD-10-CM

## 2020-02-13 NOTE — Progress Notes
PATIENT: Chad Watkins  MRN: [id number]  DOB: 1946/01/01  DATE OF SERVICE: 02/13/2020    CHIEF COMPLAINT:   Chief Complaint   Patient presents with   ??? Follow-up     blood sugar   ??? Fall     Per pt. fall and fainted twice within the last two weeks      1) middle of night going to bathroom without lights on missed handrail on stairs and fell down multiple steps - rolled on back per pt.  No LOC/ALOC/head trauma.  No body aches or pains  2) urinated in middle of night, then walking out fell unconscious for few seconds.  Did hit head on washing machine but no sx's of concussion since then.  Medics came and evaluated and allowed him to stay believing was vaso-vagal in nature.  Doing well since then.      Past Medical History:   Diagnosis Date   ??? Colon polyps-2020 09/19/2019   ??? Diabetic neuropathy (HCC/RAF) 01/27/2018   ??? Diabetic peripheral vascular disease (HCC/RAF) 09/03/2017   ??? DM type 1 with diabetic mixed hyperlipidemia (HCC/RAF) 09/03/2017   ??? Edema leg 07/14/2018   ??? History of fracture of left ankle 12/01/2017   ??? Hypertension associated with diabetes (HCC/RAF) 09/03/2017   ??? Long term (current) use of insulin (HCC/RAF) 09/19/2019   ??? Peripheral vascular disease (HCC/RAF) 09/03/2017   ??? Trigger thumb 01/27/2018   ??? Type 1 diabetes mellitus with diabetic neuropathy, unspecified (HCC/RAF) 09/03/2017       No past surgical history on file.    Social History     Tobacco Use   Smoking Status Never Smoker   Smokeless Tobacco Never Used       No family history on file.    Social History     Substance and Sexual Activity   Alcohol Use Yes   ??? Alcohol/week: 4.2 oz   ??? Types: 7 Standard drinks or equivalent per week       Social History     Substance and Sexual Activity   Drug Use Not on file       No Known Allergies    Patient Active Problem List   Diagnosis   ??? Bursitis   ??? Skin cancer   ??? Palpitation   ??? Need for vaccination   ??? Healthcare maintenance   ??? Abnormal EKG   ??? Left ankle pain   ??? Hypertension associated with diabetes (HCC/RAF)   ??? DM type 1 with diabetic mixed hyperlipidemia (HCC/RAF)   ??? DM (diabetes mellitus), type 1, uncontrolled, periph vascular complic (HCC/RAF)   ??? Peripheral vascular disease (HCC/RAF)   ??? DM (diabetes mellitus), type 1, uncontrolled w/neurologic complication (HCC/RAF)   ??? History of fracture of left ankle   ??? Diabetic neuropathy (HCC/RAF)   ??? Trigger thumb   ??? Edema leg   ??? Mild nonproliferative diabetic retinopathy (HCC/RAF)   ??? Pseudophakia   ??? Phyisological cupping of optic disc, right   ??? Long term (current) use of insulin (HCC/RAF)   ??? Colon polyps-2020         Current Outpatient Medications:   ???  acetaminophen-codeine (TYLENOL #3) 300-30 mg tablet, TAKE 1 TABLET EVERY 4 HOURS AS NEEDED FOR PAIN, Disp: , Rfl: 0  ???  AMLODIPINE-BENAZEPRIL 5-20 mg capsule, TAKE 1 CAPSULE BY MOUTH TWO (2) TIMES DAILY., Disp: 180 capsule, Rfl: 0  ???  cephalexin 500 mg capsule, Take 1 capsule (500 mg total) by mouth four (4) times  daily., Disp: 40 capsule, Rfl: 0  ???  ciclopirox 8% solution, Apply topically at bedtime Apply over nail and surrounding skin.., Disp: 1 bottle, Rfl: 3  ???  FLUZONE HIGH-DOSE 0.5 ML syringe, , Disp: , Rfl:   ???  FUROSEMIDE 20 mg tablet, TOME UNA TABLETA TODOS LOS DIAS EN LA MANANA, Disp: 90 tablet, Rfl: 0  ???  gatifloxacin 0.5% ophthalmic solution, , Disp: , Rfl:   ???  glucagon (BAQSIMI TWO PACK) 3 mg/dose nasal powder, 1 actuation (3 mg total) by Left Nare route every fifteen (15) minutes as needed., Disp: 3 each, Rfl: 1  ???  glucose blood test strip, Use as instructed, Disp: 100 each, Rfl: 12  ???  insulin glargine (LANTUS) 100 units/mL injection vial, USE 5 UNITS EVERY MORNING AND 3 UNITS EVERY EVENING., Disp: 90 mL, Rfl: 1  ???  insulin lispro, HumaLOG, (HUMALOG) 100 unit/mL injection vial, 3 units sq qac plus insulin sliding scale patient has.  Hold for sugar <100., Disp: 90 mL, Rfl: 2  ???  insulin lispro, HumaLOG, (HUMALOG) 100 unit/mL injection vial, 3 UNITS EACH MEAL PLUS AMOUNT DETERMINED BY MD'S SLIDING SCALE; HOLD FOR SUGAR UNDER 100., Disp: 90 mL, Rfl: 1  ???  insulin syringe needle U-100 (BD INSULIN SYRINGE U/F) 31G X 5/16'' 0.3 mL syringe, Up to 7 times a day., Disp: 700 each, Rfl: 6  ???  LOTEMAX SM 0.38 % GEL, , Disp: , Rfl:   ???  metFORMIN (GLUCOPHAGE XR) 500 mg PO ER 24 hr tablet, Take 2 tablets (1,000 mg total) by mouth two (2) times daily with meals., Disp: 360 tablet, Rfl: 3  ???  ONETOUCH ULTRA test strip, USE 1 TEST STRIP TESTING 5 TIMES A DAY., Disp: 500 strip, Rfl: 2  ???  PROLENSA 0.07 % SOLN, , Disp: , Rfl:   ???  SHINGRIX 50 MCG/0.5ML injection, , Disp: , Rfl:   ???  SILDENAFIL 100 mg tablet, TOME UNA TABLETA TODOS LOS DIAS CUANDO SEA NECESARIO FOR ERECTILE DYSFUNCTION, Disp: 10 tablet, Rfl: 10  ???  simvastatin (ZOCOR) 20 mg tablet, TOME UNA TABLETA DIARIAMENTE, Disp: 90 tablet, Rfl: 3  ???  SIMVASTATIN 20 mg tablet, TOME UNA TABLETA TODOS LOS DIAS AL ACOSTARSE, Disp: 90 tablet, Rfl: 2    Health Maintenance   Topic Date Due   ??? Advance Directive  Never done   ??? Diabetes: EYE EXAM  02/14/2020   ??? Diabetes: HGB A1C  03/18/2020   ??? Annual Preventive Wellness Visit  09/18/2020   ??? Diabetes: NEPHROPATHY MONITORING  12/10/2020   ??? Colorectal Cancer Screening  03/02/2023   ??? Tdap/Td Vaccine (3 - Td or Tdap) 11/13/2029   ??? Influenza Vaccine  Completed   ??? Pneumococcal Vaccine  Completed   ??? Hepatitis C Screening  Completed   ??? Shingles (Shingrix) Vaccine  Completed   ??? COVID-19 Vaccine  Completed   ??? Statin prescribed for ASCVD Prevention or Treatment  Completed       Patient Care Team:  Trena Platt., DO as PCP - General (Family Medicine)      Subjective:      Chad Watkins is a 75 y.o. male.    Review of Systems      Objective:      Physical Exam        ASSESSMENT AND PLAN     1. Recurrent syncope    2. Palpitation    3. Long term (current) use of insulin (HCC/RAF)    4.  Healthcare maintenance    5. Abnormal EKG    6. Peripheral vascular disease (HCC/RAF)    7. DM (diabetes mellitus), type 1, uncontrolled, periph vascular complic (HCC/RAF)    8. DM (diabetes mellitus), type 1, uncontrolled w/neurologic complication (HCC/RAF)    9. Diabetic polyneuropathy associated with type 1 diabetes mellitus (HCC/RAF)    10. DM type 1 with diabetic mixed hyperlipidemia (HCC/RAF)    11. Hypertension associated with diabetes (HCC/RAF)    12. 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 Hyponatremia        Orthostatic Values     First Set Last Set   Heart Rate: Heart Rate: 71 (02/13/20 1056) Heart Rate: 80 (02/13/20 1151)   Blood Pressure: BP: 127/65 (02/13/20 1056) BP: 114/58 (02/13/20 1151)   Position: Patient Position: Lying (02/13/20 1147) Patient Position: Standing (02/13/20 1151)   BP Location: BP Location: Left arm (02/13/20 1147) BP Location: Left arm (02/13/20 1151)   Cuff Size: Cuff Size: Regular (02/13/20 1147) Cuff Size: Regular (02/13/20 1151)   Comments:         Recent Results (from the past 12 hour(s))   POCT glucose    Collection Time: 02/13/20 12:08 PM   Result Value Ref Range    Glucose, Manual 319 (A) 65 - 100 mg/dL            Orders Placed This Encounter   ??? TBOC - Venipuncture w/Collection & Handling   ??? XR chest pa+lat (2 views)   ??? CT brain wo+w contrast   ??? CT neck angiogram w contrast   ??? Albumin/Creat Ratio Ur   ??? CBC & Auto Differential   ??? Folate,Serum   ??? Hgb A1c   ??? Magnesium   ??? Lipid Panel   ??? Vitamin D,25-Hydroxy   ??? Vitamin B12   ??? Urinalysis w/Reflex to Culture   ??? TSH with reflex FT4, FT3   ??? PTH, Intact   ??? Uric Acid   ??? Comprehensive Metabolic Panel   ??? PSA,Free & Total Profile   ??? Testosterone, Free, Total and  SHBG, Men   ??? Amylase   ??? Lipase   ??? Iron & Iron Binding Capacity   ??? Ferritin   ??? Sedimentation Rate, Erythrocyte   ??? C-Reactive Protein   ??? CK, Total   ??? Free T4   ??? UA,Dipstick   ??? UA,Microscopic   ??? CBC   ??? Differential, Automated   ??? Orthostatic blood pressure   ??? POCT glucose   ??? ECG 12-Lead Clinic Performed       Wt Readings from Last 5 Encounters:   02/13/20 145 lb 6.4 oz (66 kg)   11/14/19 142 lb (64.4 kg)   09/21/19 142 lb (64.4 kg)   09/19/19 142 lb 3.2 oz (64.5 kg)   07/13/19 153 lb (69.4 kg)     BP Readings from Last 5 Encounters:   02/13/20 114/58   11/14/19 124/69   09/21/19 115/51   09/19/19 142/65   07/13/19 122/62       Hgb A1c - HPLC   Date Value Ref Range Status   09/19/2019 7.4 (H) <5.7 % Final     Comment:     For patients with diabetes, an A1c less than (<) or equal (=) to 7.0% is recommended for most patients, however the goal may be higher or lower depending on age and/or other medical problems.   For  a diagnosis of diabetes, A1c greater than (>) or equal(=) to 6.5% indicates diabetes; values between 5.7% and 6.4% may indicate an increased risk of developing diabetes.   09/19/2018 7.7 (H) <5.7 % Final     Comment:     For patients with diabetes, an A1c less than (<) or equal (=) to 7.0% is recommended for most patients, however the goal may be higher or lower depending on age and/or other medical problems.   For a diagnosis of diabetes, A1c greater than (>) or equal(=) to 6.5% indicates diabetes; values between 5.7% and 6.4% may indicate an increased risk of developing diabetes.   07/14/2018 7.5 (H) <5.7 % Final     Comment:     For patients with diabetes, an A1c less than (<) or equal (=) to 7.0% is recommended for most patients, however the goal may be higher or lower depending on age and/or other medical problems.   For a diagnosis of diabetes, A1c greater than (>) or equal(=) to 6.5% indicates diabetes; values between 5.7% and 6.4% may indicate an increased risk of developing diabetes.     Hemoglobin A1C, Manual   Date Value Ref Range Status   05/04/2019 7.5 (Ref Range:4.2-6.5%) Note: For patients with diabetes, an A1c less than (<) or equal (=) to 7.0% is recommended for most patients, however the goal may be higher or lower depending on age and/or other medical problems. % Final     Cholesterol   Date Value Ref Range Status   09/19/2019 178 See Comment mg/dL Final     Comment:     The significance of total cholesterol depends on the values of LDL, HDL, triglycerides and the clinical context. A patient-provider discussion may be considered.       09/19/2018 184 See Comment mg/dL Final     Comment:     The significance of total cholesterol depends on the values of LDL, HDL, triglycerides and the clinical context. A patient-provider discussion may be considered.       07/14/2018 165 See Comment mg/dL Final     Comment:     The significance of total cholesterol depends on the values of LDL, HDL, triglycerides and the clinical context. A patient-provider discussion may be considered.         Cholesterol,LDL,Calc   Date Value Ref Range Status   09/19/2019 50 <100 mg/dL Final     Comment:     Patient is non-fasting, interpret with caution.  If LDL value falls outside of the designated range AND if  included in any of the following categories, a  patient-provider discussion is recommended.     Statin therapy is recommended for individuals:  1. with clinical atherosclerotic cardiovascular disease     irrespective of LDL levels;  2. with LDL > or = 190 mg/dL;  3. with diabetes, aged 40-75 years, with LDL between 70 and     189 mg/dL;  4. without any of the above but who have LDL between 70 and     189 mg/dL and an estimated 89-FYBO risk of     atherosclerotic cardiovascular disease > or = 7.5%     (consider statin therapy if estimated 10-year risk > or =     5.0%) (ACC/AHA 2013 Guidelines).   09/19/2018 66 <100 mg/dL Final     Comment:     If LDL value falls outside of the designated range AND if  included in any of the following categories, a  patient-provider discussion  is recommended.     Statin therapy is recommended for individuals:  1. with clinical atherosclerotic cardiovascular disease     irrespective of LDL levels;  2. with LDL > or = 190 mg/dL;  3. with diabetes, aged 40-75 years, with LDL between 70 and     189 mg/dL;  4. without any of the above but who have LDL between 70 and     189 mg/dL and an estimated 16-XWRU risk of     atherosclerotic cardiovascular disease > or = 7.5%     (consider statin therapy if estimated 10-year risk > or =     5.0%) (ACC/AHA 2013 Guidelines).   07/14/2018 47 <100 mg/dL Final     Comment:     If LDL value falls outside of the designated range AND if  included in any of the following categories, a  patient-provider discussion is recommended.     Statin therapy is recommended for individuals:  1. with clinical atherosclerotic cardiovascular disease     irrespective of LDL levels;  2. with LDL > or = 190 mg/dL;  3. with diabetes, aged 40-75 years, with LDL between 70 and     189 mg/dL;  4. without any of the above but who have LDL between 70 and     189 mg/dL and an estimated 04-VWUJ risk of     atherosclerotic cardiovascular disease > or = 7.5%     (consider statin therapy if estimated 10-year risk > or =     5.0%) (ACC/AHA 2013 Guidelines).     Cholesterol, HDL   Date Value Ref Range Status   09/19/2019 115 >40 mg/dL Final     Comment:     If HDL cholesterol level falls outside of the designated  range, a patient-provider discussion is recommended   09/19/2018 105 >40 mg/dL Final     Comment:     If HDL cholesterol level falls outside of the designated  range, a patient-provider discussion is recommended   07/14/2018 104 >40 mg/dL Final     Comment:     If HDL cholesterol level falls outside of the designated  range, a patient-provider discussion is recommended     Triglycerides   Date Value Ref Range Status   09/19/2019 63 <150 mg/dL Final     Comment:     Patient is non-fasting, interpret with caution.  If Triglyceride level falls outside of the designated range,  a patient-provider discussion is recommended.     09/19/2018 66 <150 mg/dL Final     Comment:     If Triglyceride level falls outside of the designated range,  a patient-provider discussion is recommended.     07/14/2018 70 <150 mg/dL Final     Comment:     If Triglyceride level falls outside of the designated range,  a patient-provider discussion is recommended.        Creatinine   Date Value Ref Range Status   09/19/2019 0.96 0.60 - 1.30 mg/dL Final   81/19/1478 2.95 0.60 - 1.30 mg/dL Final   62/13/0865 7.84 0.60 - 1.30 mg/dL Final        PHQ-9 Results  Depression Screening (Patient Health Questionnaire PHQ) 10/19/2014 10/19/2014 09/11/2015 12/24/2016 01/22/2017 01/27/2018 09/19/2019   PHQ-2: Feeling down, depressed, or hopeless No No No No No No No   PHQ-2: Little interest or pleassure in doing things No No No No No No No   Some recent data might be hidden  GAD-7 Results  No flowsheet data found.    DAST Results  No flowsheet data found.    Audit-C results  No flowsheet data found.      No follow-ups on file.  The above plan of care, diagnosis, orders, and follow-up were discussed with the patient.  Questions related to this recommended plan of care were answered.  Lonzo Cloud. Karolee Ohs, DO  02/13/2020    Author:  Lonzo Cloud. Bradely Rudin 02/13/2020 12:19 PM

## 2020-02-13 NOTE — Patient Instructions
What Is Continuous Glucose Monitoring?  Continuous glucose monitoring (CGM) is a way to continuously track your blood sugar. It???s done using a device that has sensors put under your skin. It checks the blood sugar level in the fluid between your body cells. The sensors send readings wirelessly to a monitor, or even your cell phone, every few minutes, day and night. If you have type 1 diabetes, this data can help you spot trends in how you are managing your diabetes so you can make adjustments. It can also help you know if your blood sugar is on the rise or dropping. This can help you balance food, activity, and insulin. You???ll have better control over your diabetes. Some monitors have an alarm you can set if your blood sugar reaches a certain level.   Even children can use CGM. Blood sugar readings can be sent directly to a parent???s or caregiver???s monitor or phone. It can alert parents to a child???s dropping blood sugar during the night so they can help, for instance.   CGM does not replace the need to do finger sticks and use your standard blood sugar monitor. But with CGM, you may not need to do the finger sticks as often.   The sensor is usually put on the back of the upper arm. Or it may be put on the belly (abdomen) or back. In some cases, a CGM may be part of an insulin pump. The insulin pump can automatically adjust your insulin dose.   Why might I need CGM?  CGM is a good choice if you have type 1 diabetes and take insulin. It???s ideal for people who need to have tight blood sugar control to keep blood sugar from getting to high or too low. It???s also helpful for people who may not recognize symptoms of low blood sugar. CGM is generally not recommended for patients with type 2 diabetes, even if they are taking insulin (although some studies have shown benefits in some cases).   CGM can help you see trends in your diabetes management. For example, you may notice that your blood sugar is lower at certain times of the day or after certain activities. You can start to make better decisions about what to eat and how much activity you can safely do. You can also find out if you need to change your insulin dose.   Are there drawbacks to CGM?  CGM does not replace pricking your finger and testing a drop of blood with a standard glucose meter. You will need a standard monitor to check the CGM readings before making any changes. If you are getting your CGM results on your smartphone, be sure you can still get alerts about low blood sugars when your phone is in vibrate or on other settings.   CGM costs more than a standard monitor. Some insurance plans don???t cover the cost of a CGM.   CGM gives a lot of data. Some people may find it???s too much information. They may prefer a simpler approach. The various models of CGM machines can be quite different from each other. So, be sure the training you get is specific to your CGM machine,   Wearing a device day and night can be a problem for some. Some people may react to the adhesive used to hold the device on the skin.   All CGMs have parts that need to be replaced on a regular basis. You will need to replace the sensor every 3 to 7  days.   StayWell last reviewed this educational content on 11/19/2017  ??? 2000-2020 [employer], [place]. All rights reserved. This information is not intended as a substitute for professional medical care. Always follow your healthcare professional's instructions.        Understanding Carbohydrates    A car needs the right type of fuel to run. And you need the right kind of food to function. To keep your energy level up, your body needs food that has carbohydrates. But carbs raise blood sugar levels higher and faster than other kinds of food. Your dietitian will work with you to figure out the amount of carbs you need. Carbs come in 3 types: starches, sugars, and fiber.  Starches  Starches are found in grains, some vegetables, and beans. Grain products include bread, pasta, cereal, and tortillas. Starchy vegetables include potatoes, peas, corn, lima beans, yams, and squash. Kidney beans, pinto beans, black beans, garbanzo beans, and lentils also have starches.  Sugars  Sugars are found naturally in many foods. Or they can be added. Foods that contain natural sugar include fruits and fruit juices, dairy products, honey, and molasses. Added sugars are found in most desserts, processed foods, candy, regular soda, and fruit drinks. These are very helpful to treat low blood sugar (hypoglycemia). They give you sugar quickly. Try to keep at least 15 to 20 grams of these simple sugars with you at all times. Eat or drink these if you start to have symptoms of low blood sugar.  Fiber  Fiber comes from plant foods. Your body can't digest most fiber. Instead of raising blood sugar levels like other carbs, fiber stops blood sugar from rising too fast. Fiber is found in fruits, vegetables, whole grains, beans, peas, and many nuts.  Carb counting  Keep track of the amount of carbs you eat. This can help you keep the right balance of carbs, physical activity, and medicine. The amount of carbs you need will be different from what other people need. How much you need depends on many things. These include your health, the medicines you take, and how active you are. Your healthcare team will help you figure out the right amount of carbs for you. You may start with 45 to 60 grams of carbs per meal, depending on your case. Carb counting is a system that helps you keep track of the carbohydrates you eat at each meal.  Carbs come from a variety of foods. These include grains, starchy vegetables, fruit, milk, beans, and snack foods. You can either count carbohydrate grams or carbohydrate servings. When you count carbohydrate servings, 1 carbohydrate serving = 15 grams of carbohydrates.  Here are some examples of foods that have about 15 grams of carbohydrates (1 serving of carbohydrates):  ??? 1/2 cup of canned or frozen fruit  ??? A small piece of fresh fruit (4 ounces)  ??? 1 slice of bread  ??? 1/2 cup of oatmeal  ??? 1/3 cup of rice  ??? 4 to 6 crackers  ??? 1/2 English muffin  ??? 1/2 cup of black beans  ??? 1/4 of a large baked potato (3 ounces)  ??? 2/3 cup of plain fat-free yogurt  ??? 1 cup of soup  ??? 1/2 cup of casserole  ??? 6 chicken nuggets  ??? 2-inch-square brownie or cake without frosting  ??? 2 small cookies  ??? 1/2 cup of ice cream or sherbet  Carb counting is easier when food labels are available. Look at the label to see  how many grams of total carbohydrates per serving the food contains. Then you can figure out how much you should eat. If your food doesn't have a nutrition label, you should be able to get an idea how many carbs there are per serving by using a book or website.  Two very important lines to look at on the label are the serving size and the total carbohydrate amount per serving. Here are some tips for using food labels to count your carbs:  ??? Check the serving size. The information on the label is based on that serving size. If you eat more than the listed serving size, you may have to double or triple the other information on the label.???  ??? Check the total grams of carbohydrates. Total carbohydrate from the label includes sugar, starch, and fiber. Be sure to use the total carbohydrate number and not sugar alone.  ??? Know how many grams of carbs you can have.??? Be familiar with the matching portion sizes.  ??? Compare labels.???Compare the labels of different products. Look at serving sizes and total carbs to find the products that work best for you.???  ??? Don't forget protein and fat. With the focus on carb counting, it might be easy to forget protein and fat in your meals. Don't forget to include sources of protein and healthy fat to balance your meals. Also watch how much salt (sodium) you eat. This is especially true if you have high blood pressure. If you have diabetes, limit the amount of sodium to less than 2,300 mg a day.  It???s also important to be consistent with the amount of carbs and time you eat when taking a fixed dose of diabetes medicine. Work with your healthcare provider or dietitian if you need more help. He or she can help you keep track of your carbs. He or she can also help you figure out how many grams of carbs you should have.  StayWell last reviewed this educational content on 09/19/2016  ??? 2000-2020 [employer], [place]. All rights reserved. This information is not intended as a substitute for professional medical care. Always follow your healthcare professional's instructions.        Diet: Diabetes  Food is an important tool that you can use to control diabetes and stay healthy. Eating well-balanced meals in the correct amounts will help you control your blood glucose levels and prevent low blood sugar reactions. It will also help you reduce the health risks of diabetes. There is no one specific diet that is right for everyone with diabetes. But there are general guidelines to follow. A registered dietitian (RD) will create a tailored diet approach that???s just right for you. He or she will also help you plan healthy meals and snacks. If you have any questions, call your dietitian for advice.  Guidelines for success  Talk with your healthcare provider before starting a diabetes diet or weight loss program. If you haven't talked with a dietitian yet, ask your provider for a referral. The following guidelines can help you succeed:  ??? Select foods from the 6 food groups below. Your dietitian will help you find food choices within each group. He or she will also show you serving sizes and how many servings you can have at each meal.  ? Grains, beans, and starchy vegetables  ? Vegetables  ? Fruit  ? Milk or yogurt  ? Meat, poultry, fish, or tofu  ? Healthy fats  ??? Check your blood sugar levels  as directed by your provider. Take any medicine as prescribed by your provider.  ??? Learn to read food labels and pick the right portion sizes.  ??? Limit carbohydrates at each meal to help manage your diabetes. The carbohydrates you eat become glucose in the blood. Talk with your healthcare provider about how many grams of carbohydrates are recommended for you at each meal. Eat 3 meals a day, at consistent times. Don't skip meals. If you are hungry between meals, eat a small, low-carbohydrate snack.  ??? Talk with your healthcare provider if you drink alcohol. Alcohol can have unpredictable effects on blood glucose. It's also high in empty calories and can raise a type of blood fat called triglycerides. Drink water or calorie-free diet drinks instead.  ??? Eat less fat to help lower your risk of heart disease. Use nonfat or low-fat dairy products and lean meats. Avoid fried foods. Use cooking oils that are unsaturated, such as olive, canola, or peanut oil.  ??? Don't eat foods with added salt. Salt can contribute to high blood pressure, which can cause heart disease. People with diabetes already have a risk for high blood pressure and heart disease.  ??? Stay at a healthy weight. If you need to lose weight, cut down on your portion sizes. But don???t skip meals. Exercise is an important part of any weight management program. Talk with your provider about an exercise program that???s right for you.  ??? For more information about the best diet plan for you, talk with an RD. To find an RD in your area, contact:  ? Academy of Nutrition and Dietetics www.eatright.org  ? American Diabetes Association [phone] www.diabetes.org  StayWell last reviewed this educational content on 07/19/2017  ??? 2000-2020 [employer], [place]. All rights reserved. This information is not intended as a substitute for professional medical care. Always follow your healthcare professional's instructions.        Healthy Meals for Diabetes     A healthcare provider will help you develop a meal plan that fits your needs.   Ask your healthcare team to help you make a meal plan that fits your needs. Your meal plan tells you when to eat your meals and snacks, what kinds of foods to eat, and how much of each food to eat. You don???t have to give up all the foods you like. But you do need to follow some guidelines.   Choose healthy carbohydrates  Starches, sugars, and fiber are all types of carbohydrates.???Fiber can help lower your cholesterol and triglycerides. Fiber is also healthy for your heart. You should have 20 to 35 grams of total fiber each day. Fiber-rich foods include:   ??? Whole-grain breads and cereals  ??? Bulgur wheat  ??? Brown rice ??? Whole-wheat pasta  ??? Fruits and vegetables  ??? Dry beans, and peas   Keep track of the amount of carbohydrates you eat. This can help you keep the right balance of physical activity and medicine. The amount of carbohydrates needed will vary for each person. It depends on many things such as your health, the medicines you take, and how active you are. Your healthcare team will help you figure out the right amount of carbohydrates for you. You may start with around 45 to 60 grams of carbohydrates per meal, depending on your situation.???   Here are some examples of foods containing about 15 grams of carbohydrates (1 serving of carbohydrates):   ??? 1/2 cup of canned or frozen fruit  ???  A small piece of fresh fruit (4 ounces)  ??? 1 slice of bread  ??? 1/2 cup of oatmeal  ??? 1/3 cup of rice  ??? 4 to 6 crackers  ??? 1/2 English muffin  ??? 1/2 cup of black beans  ??? 1/4 of a large baked potato (3 ounces)  ??? 2/3 cup of plain fat-free yogurt  ??? 1 cup of soup  ??? 1/2 cup of casserole  ??? 6 chicken nuggets  ??? 2-inch-square brownie or cake without frosting  ??? 2 small cookies  ??? 1/2 cup of ice cream or sherbet  Choose healthy protein foods  Eating protein that is low in fat can help you control your weight. It also helps keep your heart healthy. Low-fat protein foods include:   ??? Fish  ??? Plant proteins, such as dry beans and peas, nuts, and soy products like tofu and soymilk  ??? Lean meat with all visible fat removed  ??? Poultry with the skin removed  ??? Low-fat or???nonfat milk, cheese, and yogurt  Limit unhealthy fats and sugar  Saturated and trans fats are unhealthy for your heart. They raise LDL (bad) cholesterol. Fat is also high in calories, so it can make you gain weight. To cut down on unhealthy fats and sugar, limit these foods:   ??? Butter or margarine  ??? Palm and palm kernel oils and coconut oil  ??? Cream  ??? Cheese  ??? Bacon  ??? Lunch meats ??? Ice cream  ??? Sweet bakery goods such as pies, muffins, and donuts  ??? Jams and jellies  ??? Candy bars  ??? Regular sodas   How much to eat  The amount of food you eat affects your blood sugar. It also affects your weight. Your healthcare team will tell you how much of each type of food you should eat.   ??? Use measuring cups and spoons and a???food scale to measure serving sizes.  ??? Learn what a correct serving size looks like on your plate. This will help when you are away from home and can???t measure your servings. For example, a serving of meat is about the palm of your hand.  ??? Eat only the number of servings given on your meal plan for each food. Don???t take seconds.  ??? Learn to read food labels. Be sure to look at serving size, total carbohydrates, fiber, calories, sugar, and saturated and trans fats. Look for healthier alternatives to foods that have added sugar.  ??? Plan ahead for parties so you can still have a good time without going overboard with unhealthy food choices. Set a good example yourself by bringing a healthy dish to pot lucks.???  Choose healthy snacks  When it comes to snacks, we usually think about foods with added sugar and fats. But there are many other options for healthier snack choices. Here are a few snack ideas to choose from:   Snacks with less than 5 grams of carbohydrates  ??? 1 piece of string cheese  ??? 3 celery sticks plus 1 tablespoon of peanut butter  ??? 5 cherry tomatoes plus 1 tablespoon of ranch dressing  ??? 1 hard-boiled egg  ??? 1/4 cup of fresh blueberries  ??? ???5 baby carrots  ??? 1 cup of light popcorn  ??? 1/2 cup of sugar-free gelatin  ??? 15 almonds  Snacks with about 10 to 20 grams of carbohydrates  ??? 1/3 cup of hummus plus 1 cup of fresh cut nonstarchy vegetables (carrots, green peppers, broccoli, celery,  or a combination)  ??? 1/2 cup of fresh or canned fruit plus 1/4 cup of cottage cheese  ??? 1/2 cup of tuna salad with 4 crackers  ??? 2 rice cakes and a tablespoon of peanut butter  ??? 1 small apple or [place]  ??? 3 cups light popcorn  ??? 1/2 of a [place] sandwich (1 slice of whole-wheat bread, 2 ounces of [place], and mustard)  Portion sizes are important to controlling your blood sugar and staying at a healthy weight. Stock up on healthy snack items so you always have them on hand.   When to eat  Your meal plan will likely include breakfast, lunch, dinner, and some snacks.  ??? Try to eat your meals and snacks at about the same times each day.  ??? Eat all your meals and snacks. Skipping a meal or snack can make your blood sugar drop too low. It can also cause you to eat too much at the next meal or snack. Then your blood sugar could get too high.  StayWell last reviewed this educational content on 11/19/2016  ??? 2000-2020 [employer], [place]. All rights reserved. This information is not intended as a substitute for professional medical care. Always follow your healthcare professional's instructions.        Getting Support When You Have Diabetes???    It's mostly up to you to control your blood sugar. But your diabetes healthcare team can help. These experts will teach you how to manage diabetes and the health risks it brings. With practice, controlling your blood sugar will become a habit.  Working with your healthcare team  Your diabetes healthcare team will work with you to create a treatment plan. The goal is to keep your blood sugar controlled. This can delay or prevent other health problems. Your healthcare team is likely to include:  ??? A primary care provider. This might be your regular healthcare provider. This may have been the first person to tell you about diabetes.  ??? An endocrinologist. This healthcare provider has special training to treat people with diabetes. But most people with diabetes don't need to see this specialist.???  ??? A registered dietitian. An RD can teach you how food and healthy eating can help you control blood sugar.  ??? A diabetes educator. This might be a nurse, dietitian, or pharmacist. He or she will teach you about managing diabetes.  ??? A health psychologist or Child psychotherapist. This person can help you cope with the feelings and stresses that diabetes may bring.  ??? Other healthcare team members. These can include an eye healthcare provider, dentist, podiatrist, pharmacist, occupational therapist, and exercise physiologist???  Who else can help?  Managing your diabetes day to day can be a lot to handle. It might affect your home and work routines. Don???t be afraid to let your family, friends, and work [employer]ch[employer], [employer] know about your condition. These people can support your need to stick to your treatment plan.  Reach out to family and friends  Family and friends can support your efforts to take care of yourself. So don???t feel bad asking for help when you need it. Ask them to learn along with you if they have questions or aren???t taking your diagnosis seriously. It might be hard at first for them to understand some of the changes you are making. Tell them that your health is your priority. Their support can help keep you focused and confident as you learn to control your blood sugar. Don't let them  keep you from making the good choices you need to keep your diabetes under control. Sometimes people need to change their circle of friends. They need to surround themselves with people who support the lifestyle changes they must make.  StayWell last reviewed this educational content on 11/19/2017  ??? 2000-2020 [employer], [place]. All rights reserved. This information is not intended as a substitute for professional medical care. Always follow your healthcare professional's instructions.        Diabetes: Learning About Serving and [employer] and portions. What???s the difference? These terms can be very confusing. But learning to measure serving sizes can help you figure out how many carbohydrates (carbs) and other foods you eat each day. They are also powerful tools for managing your weight.      A good rule of thumb: Devote half your plate to vegetables and green salad. Split the other half between protein and starchy carbohydrates. Fruit makes a good dessert.   Servings and portions   Many different words are used to describe amounts of food. If your healthcare provider uses a term you???re not sure of, don???t be afraid to ask. It helps to know the difference between servings and portions:   ??? A serving size???is a fixed size.???Food producers use this term to describe their products. For example, the label on a cereal box could say that 1 cup of dry cereal = 1 serving.  ??? A portion???(also called a ???helping???) is how much you eat or how much you put on your plate at a meal. For example, you might eat 2 cups of cereal at breakfast.  Watching serving sizes   The portion you choose to eat (such as 2 cups of cereal) may be more than 1 serving as listed on the food label (such as 1 cup of cereal). That???s why it helps to measure or weigh the food you eat. Because the food label values are based on servings, you???ll need to know how many servings you eat at 1 sitting.   When you???re planning for a snack or a meal, keep servings in mind. If you don???t have measuring cups or a scale handy, there are ways to ???eyeball??? serving sizes, such as comparing your food to the size of your hand (see pictures below).      Ounces: 2 to 3 ounces is about the size of your palm.      1 cup: 1 cup (or a medium-sized piece) is about the size of your fist.      1/2 cup: 1/2 cup is about the size of your cupped hand.     Managing portion sizes   If your weight is a concern, reducing your portions can help. You can eat more than one serving of a food at once. But to keep from eating too much at one meal, learn how to manage your portions. A portion is the amount of each type of food on your plate.   StayWell last reviewed this educational content on 04/19/2017  ??? 2000-2020 [employer], [place]. All rights reserved. This information is not intended as a substitute for professional medical care. Always follow your healthcare professional's instructions.        Understanding Type 1 Diabetes  To get energy, the body breaks down food into fuel. When you have diabetes, your body has trouble using this fuel for energy.     How the body normally gets energy   The digestive system breaks  down food. This results in a sugar (glucose). Some of this sugar is stored in the liver. But most of it enters the blood. It travels to the cells to be used as fuel. Glucose needs a hormone called insulin to enter the cells. Insulin is made in the pancreas, an organ in the belly. The insulin is released into the blood in response to the presence of glucose in the blood. Think of insulin as a key. When insulin reaches a cell, it attaches to the cell wall. This signals the cell to make an opening that allows glucose to enter the cell.   When you have type 1 diabetes   Special cells (beta cells) in the pancreas actually make insulin. In most people, type 1 diabetes is an autoimmune disease. This means the body's immune system harms the beta cells. This???decreases the amount of insulin made by the pancreas. In children, this often happens quickly. In adults, it isn't as fast. Without insulin, your cells can't get glucose to burn for energy. This is why you may feel weak or tired.   Glucose builds up   Without insulin, glucose can???t enter the cells. It stays in your blood???instead. The liver also puts out even more glucose into the blood. This may lead to higher and higher blood sugar levels (hyperglycemia). Over time, high blood sugar levels can cause serious health problems.   Ketones form   When your cells can???t get glucose to burn for energy, they burn fat instead. This leaves behind acids called ketones in your blood and urine (ketosis). High blood sugar, high ketones, and other chemical changes in the blood (metabolic acidosis) can cause a dangerous condition called diabetic ketoacidosis (DKA). For some people, DKA is the first sign of type 1 diabetes.   Insulin can be replaced   The insulin you???re missing can be replaced with shots of insulin (injections). Insulin can't be taken by mouth. That's because it is broken down during digestion just like protein. But the insulin you???re missing can be replaced with shots (injections) of insulin. Some people also use insulin pumps. Then your body can burn glucose for energy. This???helps keep your blood sugar in a healthy range.   Long-term complications  Over time, high blood sugar levels can harm blood vessels. This can lead to health problems (complications). Keeping your blood sugar in???a healthy range can help prevent or delay complications. Balancing insulin, diet, and activity is important. This can help keep blood sugar levels in your target range. And it can prevent health problems. This is also true for regular check-ups, lab tests, and exams. Complications that may happen include:   ??? Eye problems  ??? Kidney disease  ??? Nerve damage  ??? Digestion problems  ??? Sexual problems  ??? Tooth and gum problems  ??? Skin and foot problems  ??? Heart and blood vessel disease  StayWell last reviewed this educational content on 06/19/2017  ??? 2000-2020 [employer], [place]. All rights reserved. This information is not intended as a substitute for professional medical care. Always follow your healthcare professional's instructions.        Diabetes: Shopping for and Making Meals    Having diabetes doesn???t mean you have to shop in a special aisle or look for special foods. But you will need to make healthy food choices. Comparing items and reading food labels is key. This can help you find the healthiest foods for you and your family.  Comparing items  When you shop, compare  items to find the best ones for your needs. Keep these facts in mind:  ??? ???No sugar added??? does not mean a product is sugar-free.  ??? ''Sugar-free'' means less than 1/2 gram (g) of sugar per serving.  ??? ???Fat free??? means less than 1/2 g of fat per serving. This does not necessarily mean the product is low in calories.  ??? ???Low fat??? means 3 g fat or less per serving. ???Reduced fat??? or ???less fat??? means 25% less fat than the regular version. Some of this fat may be saturated or trans fat. And the calories per serving may be similar to the regular version.  Making small changes  Don???t try to change all of your eating habits at once. Here are some ideas to start with:  ??? Try fat-free or low-fat cheese, milk, and yogurt. Also try leaner cuts of meat. This will help you cut down on saturated fat.  ??? Try whole-grain breads, brown rice, and whole-wheat pasta.  ??? Load up on fresh or frozen vegetables. If you buy canned, choose low-sodium vegetables.  ??? Stay away from processed foods as much as possible. They tend to be low in fiber and high in trans fats and salt.  ??? Try tofu, soy milk, or meat substitutes.?They can help you cut cholesterol and saturated fat out of your diet.  Learning to read food labels  To find healthy foods that help you control blood sugar, learn how to read food labels. Look for the Nutrition Facts label on packaged foods. It will tell you how many servings there are in the package. It will also tell you the amount of carbohydrate, sugar, fat, and fiber in each serving. Then you can decide if the food fits into your meal plan.  Using the food label  So, once you have the food label, what do you do with it? The food label helps in many ways. Use it to:  ??? Compare items. Decide which is the best for your health needs.  ??? Track the number of carbohydrates in your portions.  ??? Figure out how many servings of a food you can have and still stay within the number of carbohydrates for that meal.  Planning meals  For good blood sugar control, plan what and when you???ll eat. Start by making a meal plan that includes all the food groups. Then time your meals and exercise to help keep your blood sugar level steady. You may need to adjust your plan for special situations. But 3 of the best ways to manage your blood sugar are to:  ??? Eat meals and snacks at the same time every day  ??? Eat about the same amount of food  ??? Exercise each day  ???  Eat from all the food groups  The basis of a healthy meal plan is variety (eating many different types of foods). Look for lean meats, fresh fruits and vegetables, whole grains, and low-fat or nonfat dairy products. Eating a wide variety of healthy foods offers the nutrients your body needs. It can also keep you from getting bored with your meal plan.  Reduce liquid sugars  Extra calories from sodas, sports drinks, and fruit drinks make it hard to keep blood sugar in range. Cut as many liquid sugars from your meal plan as you can. This includes most fruit juices. These are often high in natural or added sugar. Instead, take plenty of water and other sugar-free drinks.  Eat less fat  If you need  to lose some weight, try to reduce the amount of fat in your diet. This can also help lower your cholesterol level. This can keep blood vessels healthier. Cut fat by using only small amounts of liquid oil for cooking. Read food labels carefully. This can help you stay away from foods with unhealthy trans fats.  Timing your meals  When it comes to blood sugar control, when you eat is as important as what you eat. You may need to eat several small meals spaced evenly during the day. This can help you stay in your target range. So don???t skip breakfast or wait until late in the day to get most of your calories. Doing so can make your blood sugar rise too high or fall too low.  Cooking wisely  ??? Broil, steam, bake, or grill meats and vegetables. Don't fry them.  ??? Flavor foods with vegetable pur???e, lemon or lime juice, or herb seasonings. Don't use cream-based sauces or sugary glazes.  ??? Remove skin from chicken and [place] before serving.  ??? Look in cookbooks for easy low-fat, low-sugar recipes. When making your normal recipes, cut sugar by 1/2. Cut fat by 1/3.    StayWell last reviewed this educational content on 04/19/2017  ??? 2000-2020 [employer], [place]. All rights reserved. This information is not intended as a substitute for professional medical care. Always follow your healthcare professional's instructions.        Types of Insulin  Insulin is a type of hormone. It helps the body use blood sugar (glucose) for fuel. With diabetes, your body may not make enough insulin. Then you may need insulin injections.???If you have type 2 diabetes, your cells may have trouble using insulin. There are many types of insulin that can be prescribed for your treatment. Your healthcare provider will work with you to find the types that are best for you. Most insulin is made in a lab. It???s called human insulin because it???s just like the insulin that???s made in the body. Some types of insulin work fast. Other types work slowly and last longer.   The different types of insulin work in the following ways:   1. Rapid-acting insulin:  ??? Begins working about??? 15 minutes after taken.  ??? Peaks???about 1 hour after taken.  ??? Keeps working for 2 to 4 hours.  Name of insulin: ???????????????????????????????????????????????????????????????????????????????????????????????????????????????????????????????????????????????????????????????????????????????????????????????????????????????????????????   2. Short-acting or regular insulin:  ??? Begins working about??? 30 minutes after taken.  ??? Peaks within 2 to 3 hours after taken.  ??? Keeps working for 3 to 6 hours.  Name of insulin: ??????????????????????????????????????????????????????????????????????????????????????????????????????????????????????????????????????????????????????????????????????????????????????????????????????????????????  3. Intermediate-acting insulin:  ??? Begins working about 2 to 4 hours after taken.  ??? Peaks 4 to 12 hours after taken.  ??? Keeps working for??? 12 to 18 hours.  Name of insulin: ??????????????????????????????????????????????????????????????????????????????????????????????????????????????????????????????????????????????????????????????????????????????????????????????????????????????????  4. Long-acting insulin:  ??? Begins working several hours after injection  ??? No peak.  ??? Continuous, even action for about 24 hours  Name of insulin: ??????????????????????????????????????????????????????????????????????????????????????????????????????????????????????????????????????????????????????????????????????????????????????????????????????????????????  5. Premixed combinations of intermediate-acting and short-acting insulin:  ??? Begins working about 30 to 60 minutes after taken.  ??? Strongest action is variable.  ??? Keeps working for 10 to 16 hours.  Name of insulin: ???????????????????????????????????????????????????????????????????????????????????????????????????????????????????????????????????????????????????????????????????????????????????????????????????????????????   6. Premixed combinations of intermediate-acting and rapid-acting insulin:  ??? Begins working about 5 to 15 minutes after taken.  ??? Strongest action is variable.  ??? Keeps working for 10 to 16 hours.  Name of insulin: _____________________________  7. Inhaled insulin:*   ???  Begins working about 12 to 15 minutes after taken.  ??? Strongest action is about 30 minutes after being inhaled.  ??? Keeps working for 180 minutes.  ??? *Inhaled insulin must be used with injectable long-acting insulin in:  ? People with type 1 diabetes  ? People with type 2 diabetes who use long-acting insulin  Name of insulin: _____________________________  Nancie Neas last reviewed this educational content on 12/19/2016  ??? 2000-2020 [employer], [place]. All rights reserved. This information is not intended as a substitute for professional medical care. Always follow your healthcare professional's instructions.        Diabetes: Living Your Life    Having diabetes may mean changes at work. It may also mean changes in your social life. But these changes won't keep you from doing well at work or enjoying your free time.   Family and friends  Your family and friends may have questions about diabetes. They may not understand why you need to make changes in your life. Urge them to learn about diabetes???with you.???Spend time with friends who support you in taking good care of yourself.   Special times   Parties and holidays often mean more food and drink. Or there may be different food or drink. You can still enjoy these events:   ??? At parties, focus on enjoying music, dancing, or talking to friends.  ??? Bring a snack or dish that works well for you. The other guests might welcome low-calorie versions of their favorite foods.???  ??? Before the next holiday, learn how to fit traditional foods into your meal plan.  ??? Religious holidays may mean fasting or other changes in the way you eat. Talk with your healthcare provider, your dietitian, and your clergy. Ask them how you can observe holidays safely.???  ??? When eating out, ask for a box to take home some of the food to eat later.  Work  [employer], shift changes, or business travel may affect diabetes care.  ??? Make diabetes a priority. If your work schedule changes often or you find it hard to manage your daily tasks, talk with your healthcare provider and your employer.  ??? You may need to make special plans to do your daily tasks. This might include checking your blood sugar.  ??? As long as you can do your job safely, your employer can???t discriminate against you because of your health.  ???  Tell your healthcare provider???if you???re feeling helpless or hopeless. Also tell him or her if you have trouble sleeping or eating. These are symptoms of depression. This is a serious but treatable problem.   StayWell last reviewed this educational content on 09/19/2016  ??? 2000-2020 [employer], [place]. All rights reserved. This information is not intended as a substitute for professional medical care. Always follow your healthcare professional's instructions.        Nutrition Facts Labels and Diabetes  If you have diabetes, eating the right foods is key to staying healthy. Reading the Nutrition Facts labels on foods is an important first step. . You know that many foods can either help or hurt your health. And those labels can help you make the right decisions.  What???s on a Nutrition Facts label?  Nutrition Facts labels are on all prepared foods by law. You may also see Nutrition Facts labels on raw fruits and vegetables and on fish. The FDA oversees the safety of foods and beverages. This agency also says what information must be listed on the  labels. These labels have a lot of information. They show the amount of sugar, carbohydrates, sodium, cholesterol, dietary fiber, different types of fats, and other information.  Keep in mind that the amounts listed on a nutrition label are for 1 serving, not the entire package. Check the serving size. The package may contain more servings than you realize. The percentages on the label are for either a 2,000- or 2,500-calorie diet. Here???s a guide to what???s on a label:  ??? Serving size. This is the amount for 1 serving of the food. All of the values on the label are based on 1 serving size.  ??? Servings per container. This is how many servings are in the package.  ??? Calories. This is the number of calories in 1 serving.  ??? Calories from fat. This is the number of calories that come just from the fat in the food.  ??? % Daily value. This is what percent the values are of a 2,000-calorie-a-day diet.  ??? Total fat. This is how much of all types of fats are in 1 serving. This includes the fats listed below.  ??? Saturated fat. This is how much saturated fat is in 1 serving. Saturated fat is an unhealthy fat.  ??? Monounsaturated fat. This is how much monounsaturated fat is in 1 serving. Monounsaturated fat is a healthy fat.  ??? Polyunsaturated fat. This is how much polyunsaturated fat is in 1 serving. Polyunsaturated fat is a healthy fat.  ??? Trans fat. This is how much trans fat is in 1 serving. Trans fat is an unhealthy fat.  ??? Cholesterol. This is how much cholesterol is in 1 serving. Cholesterol is unhealthy in large amounts.  ??? Sodium. This is how much sodium is in 1 serving. Sodium is unhealthy in large amounts.  ??? Total carbohydrate. This means all types of carbohydrates in the food. It includes sugar, non-sugar carbohydrates, and fiber.  ??? Dietary fiber. This refers to the type of fiber that is hard for the body to digest. Fiber is healthy.  ??? Sugars. This includes natural sugars and sugars that were added when the food was made. Sugar is unhealthy in large amounts.  ??? Sugar alcohols. Sugar alcohols are replacements for sugar in sugar-free foods. They don???t affect blood sugar levels as much as regular sugar. They include sorbitol, mannitol, xylitol, and maltitol. Too much of these can cause nausea and diarrhea.  ??? Protein. This is how much protein is in 1 serving. Protein is an important building block of the body.  ??? Other nutrients. Near the bottom of the label, nutrients such as vitamins, calcium, and iron are listed. The percent values listed are for the recommended daily value for that nutrient. The FDA requires amounts of vitamin D and potassium starting in July 2018.  Your goals when picking foods  When you have diabetes, it???s important to keep your blood sugar at healthy levels. This means eating foods low in carbohydrates. A second goal is to eat heart-healthy. This is because people with diabetes have a higher risk for heart disease and stroke. To eat heart-healthy, you???ll need to limit sodium, cholesterol, and unhealthy fats. When you get in the habit of reading labels, you???ll find many foods have versions that are better for you. Plus, some foods also have an American Heart Association Heart-Check label. This means that the food meets the AHA rules for being a heart-healthy food.  ??? Know about???carbs. All carbs are not the same. Carbohydrates may be sugars,  starches, or fiber.???Look at the Total Carbohydrate number on the label to see the total amount of???carbohydrates in the food.  ??? Choose low sodium. Many high-sodium foods come in low-sodium or salt-free versions. You can find low-sodium versions of cheeses, deli meats, soups, bread, crackers, and nuts.  ??? Go low cholesterol. And many foods are naturally low in cholesterol or have no cholesterol. Foods from plants have no cholesterol ??? and they have the dietary fiber you need.  ??? Look for low-fat and healthy fats. Look for foods that have lower amounts of saturated fat and trans fat. Pick foods with healthy fats, such as monounsaturated and polyunsaturated fats. And you can find fat-free versions of some foods that normally have less-healthy fats. For example, you can find fat-free or low-fat milk, cheese, yogurt, and sour cream. But be sure that your ''fat-free'' food choices are not simply ''high-carb'' foods in disguise. All food sold in the [place] to be free of added trans fats after June 2018.  Decoding the front of the package  A food package may say ???low sodium??? or ???low sugar??? on the front, but what does that mean? The FDA has legal definitions for these terms. Here???s a guide for understanding the claims on the box or bag:  ???Reduced sugar??? It has at least 25% less sugar per serving than the regular version of that food.   ???Sugar-free??? It has less than 0.5 g of sugar per serving.   ???No sugar added??? No sugar has been put into the food. It may still contain natural sugars.   ???Reduced sodium??? It has at least 25% less sodium than the regular version of that food.   ???Low sodium??? It has 140 mg of sodium or less and is 5% or less of the total daily sodium   ???Sodium free??? It has less than 5 mg of sodium per serving.   ???Reduced fat??? It has at least 25% less fat than the regular version of that food.   ???Low fat??? It has 3 g or less of total fat (this includes all types of fats).   ???Fat free??? It has less than 0.5 g of total fat (this includes all types of fats).   ???Low saturated fat??? It has 1 g or less of saturated fat.   ???Saturated fat free??? It has less than 0.5 g of saturated fat.   ???Trans fat free??? It has less than 0.5 g trans fat.   ???Reduced cholesterol??? It has at least 25% less cholesterol than the regular version of that food.   ???Low cholesterol??? It has 20 mg or less per serving.   ???Cholesterol free??? It has less than 2 mg per serving.   ???Good source of fiber??? It has 2.5 g to 4.9 g of fiber per serving.   ???High fiber??? It has 5 g or more of fiber per serving.   Quick shopping tips  ??? Read labels before you buy foods.  ??? Limit foods high in unhealthy carbs,???sodium, cholesterol, and saturated fat.  ??? Look for sugar-free foods.    StayWell last reviewed this educational content on 11/19/2016  ??? 2000-2020 [employer], [place]. All rights reserved. This information is not intended as a substitute for professional medical care. Always follow your healthcare professional's instructions.

## 2020-02-14 LAB — Amylase: AMYLASE: 109 U/L (ref 31–124)

## 2020-02-14 LAB — Magnesium: MAGNESIUM: 1.1 meq/L — ABNORMAL LOW (ref 1.4–1.9)

## 2020-02-14 LAB — CK, Total: CREATINE PHOSPHOKINASE, TOTAL: 295 U/L (ref 63–473)

## 2020-02-14 LAB — Albumin/Creatinine Ratio,Urine: ALBUMIN,URINE: <12 mg/L

## 2020-02-14 LAB — Vitamin B12: VITAMIN B12: 2041 pg/mL — ABNORMAL HIGH (ref 254–1060)

## 2020-02-14 LAB — Differential Automated: ABSOLUTE EOS COUNT: 0.15 10*3/uL (ref 0.00–0.50)

## 2020-02-14 LAB — TSH with reflex FT4, FT3: TSH: 0.46 u[IU]/mL (ref 0.3–4.7)

## 2020-02-14 LAB — Lipid Panel: CHOLESTEROL: 192 mg/dL (ref >40–<100)

## 2020-02-14 LAB — Folate,Serum: FOLATE,SERUM: 16.6 ng/mL (ref 8.1–30.4)

## 2020-02-14 LAB — Comprehensive Metabolic Panel
GFR ESTIMATE FOR NON-AFRICAN AMERICAN: 79 mL/min/{1.73_m2} (ref 20–30)
POTASSIUM: 4.5 mmol/L (ref 3.6–5.3)

## 2020-02-14 LAB — UA,Dipstick: BILIRUBIN: NEGATIVE (ref 1.005–1.030)

## 2020-02-14 LAB — PSA,Free & Total Profile: PSA,FREE: 0.24 ng/mL (ref 0–6.5)

## 2020-02-14 LAB — Hgb A1c: HGB A1C - HPLC: 8.6 — ABNORMAL HIGH (ref <5.7)

## 2020-02-14 LAB — PTH, Intact: PTH, INTACT: 34 pg/mL (ref 11–51)

## 2020-02-14 LAB — Iron & Iron Binding Capacity: % SATURATION: 53 (ref 41–179)

## 2020-02-14 LAB — Sedimentation Rate, Erythrocyte: SEDIMENTATION RATE, ERYTHROCYTE: 28 mm/h — ABNORMAL HIGH (ref <=12)

## 2020-02-14 LAB — UA,Microscopic: WBCS: 1 {cells}/uL (ref 0–22)

## 2020-02-14 LAB — Vitamin D,25-Hydroxy: VITAMIN D,25-HYDROXY: 75 ng/mL — ABNORMAL HIGH (ref 20–50)

## 2020-02-14 LAB — Uric Acid: URIC ACID: 6.7 mg/dL (ref 3.4–8.8)

## 2020-02-14 LAB — C-Reactive Protein: C-REACTIVE PROTEIN: <0.3 mg/dL (ref <0.8)

## 2020-02-14 LAB — Lipase: LIPASE: 23 U/L (ref 13–69)

## 2020-02-14 LAB — CBC: RED CELL DISTRIBUTION WIDTH-SD: 49.1 fL — ABNORMAL HIGH (ref 36.9–48.3)

## 2020-02-14 LAB — Free T4: FREE T4: 1.5 ng/dL (ref 0.80–1.70)

## 2020-02-14 LAB — Ferritin: FERRITIN: 126 ng/mL (ref 8–350)

## 2020-02-15 LAB — Testosterone, Free, Total and  SHBG, Men: TESTOSTERONE, PERCENTAGE FREE: 1.1 — ABNORMAL LOW (ref 1.6–2.9)

## 2020-03-04 MED ORDER — AMLODIPINE BESY-BENAZEPRIL HCL 5-20 MG PO CAPS
1 | ORAL_CAPSULE | Freq: Two times a day (BID) | ORAL | 0 refills | 60.00000 days
Start: 2020-03-04 — End: ?

## 2020-03-06 ENCOUNTER — Inpatient Hospital Stay: Payer: BLUE CROSS/BLUE SHIELD

## 2020-03-06 DIAGNOSIS — R002 Palpitations: Secondary | ICD-10-CM

## 2020-03-06 DIAGNOSIS — E103292 Type 1 diabetes mellitus with mild nonproliferative diabetic retinopathy without macular edema, left eye: Secondary | ICD-10-CM

## 2020-03-06 DIAGNOSIS — E1159 Type 2 diabetes mellitus with other circulatory complications: Secondary | ICD-10-CM

## 2020-03-06 DIAGNOSIS — I152 Hypertension secondary to endocrine disorders: Secondary | ICD-10-CM

## 2020-03-06 DIAGNOSIS — E782 Mixed hyperlipidemia: Secondary | ICD-10-CM

## 2020-03-06 DIAGNOSIS — E1042 Type 1 diabetes mellitus with diabetic polyneuropathy: Secondary | ICD-10-CM

## 2020-03-06 DIAGNOSIS — Z0000 Encounter for general adult medical examination without abnormal findings: Secondary | ICD-10-CM

## 2020-03-06 DIAGNOSIS — E1069 Type 1 diabetes mellitus with other specified complication: Secondary | ICD-10-CM

## 2020-03-06 DIAGNOSIS — Z794 Long term (current) use of insulin: Secondary | ICD-10-CM

## 2020-03-06 DIAGNOSIS — E1049 Type 1 diabetes mellitus with other diabetic neurological complication: Secondary | ICD-10-CM

## 2020-03-06 DIAGNOSIS — I739 Peripheral vascular disease, unspecified: Secondary | ICD-10-CM

## 2020-03-06 DIAGNOSIS — E1065 Type 1 diabetes mellitus with hyperglycemia: Secondary | ICD-10-CM

## 2020-03-06 DIAGNOSIS — E1051 Type 1 diabetes mellitus with diabetic peripheral angiopathy without gangrene: Secondary | ICD-10-CM

## 2020-03-06 DIAGNOSIS — R55 Syncope and collapse: Secondary | ICD-10-CM

## 2020-03-06 DIAGNOSIS — R9431 Abnormal electrocardiogram [ECG] [EKG]: Secondary | ICD-10-CM

## 2020-03-06 MED ORDER — AMLODIPINE BESY-BENAZEPRIL HCL 5-20 MG PO CAPS
1 | ORAL_CAPSULE | Freq: Two times a day (BID) | ORAL | 0 refills | Status: AC
Start: 2020-03-06 — End: ?

## 2020-03-06 MED ADMIN — IOHEXOL 350 MG/ML IV SOLN: 100 mL | INTRAVENOUS | @ 18:00:00 | Stop: 2020-03-06 | NDC 00407141491

## 2020-03-13 ENCOUNTER — Ambulatory Visit: Payer: BLUE CROSS/BLUE SHIELD

## 2020-03-13 DIAGNOSIS — I700 Atherosclerosis of aorta: Secondary | ICD-10-CM

## 2020-03-13 DIAGNOSIS — I771 Stricture of artery: Secondary | ICD-10-CM

## 2020-03-21 ENCOUNTER — Ambulatory Visit: Payer: BLUE CROSS/BLUE SHIELD

## 2020-03-21 NOTE — Telephone Encounter
Patient nose stopped bleeding after 10 to 15 minutes. Patient was fine afterwards. Daughter will continue to monitor and will make an appointment if needed. Please advise if there should be any other concerns.

## 2020-03-23 MED ORDER — FUROSEMIDE 20 MG PO TABS
ORAL_TABLET | 0 refills
Start: 2020-03-23 — End: ?

## 2020-03-26 MED ORDER — FUROSEMIDE 20 MG PO TABS
ORAL_TABLET | 0 refills | Status: AC
Start: 2020-03-26 — End: ?

## 2020-04-01 ENCOUNTER — Ambulatory Visit: Payer: BLUE CROSS/BLUE SHIELD | Attending: Diagnostic Radiology

## 2020-04-01 DIAGNOSIS — I729 Aneurysm of unspecified site: Secondary | ICD-10-CM

## 2020-04-01 NOTE — Consults
INTERVENTIONAL NEURORADIOLOGY  INITIAL OUTPATIENT CONSULTATION    PATIENT NAME: Chad Watkins     DOB: 1945/11/30     MR#: [id number]     DATE: 04/01/2020     REFERRING PHYSICIAN: Unknown MD    REASON FOR CONSULTATION:  This is a 75 y.o. male who was referred for evaluation of aneurysm    HISTORY OF PRESENT ILLNESS:   Chad Watkins is a 75 y.o. Spanish speaking male with history of HTN, DM1, and aortic atherosclerosis who presents with his daughter for evaluation of an incidentally revealed cerebral aneurysm during workup for recurrent syncope. Patient's history obtained from chart review and patient. Patient arrived to [facility] on 11/14/19 due to R hand pain and laceration r/t a mechanical fall while intoxicated. Patient had a fall the following day which prompted workup. CT brain and CTA neck was obtained on 03/06/20 by PCP, Dr. Karolee Ohs, which revealed a 2 mm vascular outpouching arising from the right A1-anterior communicating artery junction. Per Dr. Karolee Ohs, recurrent syncope workup did not reveal any arrhythmia, workup will be completed, but likely vasovagal. Denies any recent episodes of syncope. Denies smoking cigarettes, excessive alcohol consumption, and known family history of aneurysms. Denies severe headaches, focal weakness, dysphagia, and dysarthria.     Of note, patient's daughter translated during the visit.         PAST MEDICAL HISTORY:   Past Medical History:   Diagnosis Date   ??? Aortic atherosclerosis (HCC/RAF) 03/13/2020   ??? Colon polyps-2020 09/19/2019   ??? Diabetic neuropathy (HCC/RAF) 01/27/2018   ??? Diabetic peripheral vascular disease (HCC/RAF) 09/03/2017   ??? DM type 1 with diabetic mixed hyperlipidemia (HCC/RAF) 09/03/2017   ??? Edema leg 07/14/2018   ??? History of fracture of left ankle 12/01/2017   ??? Hypertension associated with diabetes (HCC/RAF) 09/03/2017   ??? Long term (current) use of insulin (HCC/RAF) 09/19/2019   ??? Peripheral vascular disease (HCC/RAF) 09/03/2017   ??? Tortuous aorta (HCC/RAF) 03/13/2020   ??? Trigger thumb 01/27/2018   ??? Type 1 diabetes mellitus with diabetic neuropathy, unspecified (HCC/RAF) 09/03/2017       PAST SURGICAL HISTORY:   Past Surgical History:   Procedure Laterality Date   ??? APPENDECTOMY         MEDICATIONS:   Medications that the patient states to be currently taking   Medication Sig   ??? acetaminophen-codeine (TYLENOL #3) 300-30 mg tablet TAKE 1 TABLET EVERY 4 HOURS AS NEEDED FOR PAIN   ??? AMLODIPINE-BENAZEPRIL 5-20 mg capsule TAKE 1 CAPSULE BY MOUTH TWO (2) TIMES DAILY.   ??? cephalexin 500 mg capsule Take 1 capsule (500 mg total) by mouth four (4) times daily.   ??? ciclopirox 8% solution Apply topically at bedtime Apply over nail and surrounding skin.Marland Kitchen   ??? FLUZONE HIGH-DOSE 0.5 ML syringe    ??? FUROSEMIDE 20 mg tablet TAKE 1 TABLET BY MOUTH EVERY DAY IN THE MORNING   ??? gatifloxacin 0.5% ophthalmic solution    ??? glucagon (BAQSIMI TWO PACK) 3 mg/dose nasal powder 1 actuation (3 mg total) by Left Nare route every fifteen (15) minutes as needed.   ??? glucose blood test strip Use as instructed   ??? insulin glargine (LANTUS) 100 units/mL injection vial USE 5 UNITS EVERY MORNING AND 3 UNITS EVERY EVENING.   ??? insulin lispro, HumaLOG, (HUMALOG) 100 unit/mL injection vial 3 units sq qac plus insulin sliding scale patient has.  Hold for sugar <100.   ??? insulin lispro, HumaLOG, (HUMALOG) 100 unit/mL  injection vial 3 UNITS EACH MEAL PLUS AMOUNT DETERMINED BY MD'S SLIDING SCALE; HOLD FOR SUGAR UNDER 100.   ??? insulin syringe needle U-100 (BD INSULIN SYRINGE U/F) 31G X 5/16'' 0.3 mL syringe Up to 7 times a day.   ??? LOTEMAX SM 0.38 % GEL    ??? metFORMIN (GLUCOPHAGE XR) 500 mg PO ER 24 hr tablet Take 2 tablets (1,000 mg total) by mouth two (2) times daily with meals.   ??? ONETOUCH ULTRA test strip USE 1 TEST STRIP TESTING 5 TIMES A DAY.   ??? PROLENSA 0.07 % SOLN    ??? SHINGRIX 50 MCG/0.5ML injection    ??? SILDENAFIL 100 mg tablet TOME UNA TABLETA TODOS LOS DIAS CUANDO SEA NECESARIO FOR ERECTILE DYSFUNCTION ??? simvastatin (ZOCOR) 20 mg tablet TOME UNA TABLETA DIARIAMENTE   ??? SIMVASTATIN 20 mg tablet TOME UNA TABLETA TODOS LOS DIAS AL ACOSTARSE       ALLERGIES: No Known Allergies    SOCIAL HISTORY:   Social History     Socioeconomic History   ??? Marital status: Married     Spouse name: Not on file   ??? Number of children: 4   ??? Years of education: Not on file   ??? Highest education level: Not on file   Occupational History   ??? Occupation: Soil scientist    Tobacco Use   ??? Smoking status: Never Smoker   ??? Smokeless tobacco: Never Used   Substance and Sexual Activity   ??? Alcohol use: Yes     Alcohol/week: 4.2 oz     Types: 7 Standard drinks or equivalent per week   ??? Drug use: Not on file   ??? Sexual activity: Not on file   Other Topics Concern   ??? Not on file   Social History Narrative   ??? Not on file     Social Determinants of Health     Financial Resource Strain: Not on file   Physical Activity: Not on file   Stress: Not on file       FAMILY HISTORY: No family history on file.    REVIEW OF SYSTEMS:  See HPI.  A 14 point review of systems was performed and is otherwise negative.     PHYSICAL EXAMINATION:    Vital Signs:Blood Pressure 129/61  ~ Pulse 75  ~ Temperature 36.7 ???C (98.1 ???F)  ~ Respiration 16  ~ Height 5' 6'' (1.676 m)  ~ Weight 147 lb (66.7 kg)  ~ Oxygen Saturation 98%  ~ Body Mass Index 23.73 kg/m???     GEN:  Well developed, well nourished, in no acute distress.   SKIN:  PWD, no rashes noted.    HEENT:  Normocephalic.  Sclera anicteric.   NECK:  supple without adenopathy. Trachea midline.   EXT:   without clubbing, cyanosis or edema.    NEURO:    Mental Status:   General: Awake, alert and oriented to person, place and time   Concentration and attention span: Normal   Fund of knowledge: Adequate recent and remote recall   Language: Fluent and articulate without evidence of aphasia or dysarthria  Cranial Nerves:   II: PERRL, visual fields full to confrontation   III, IV, VI: Gaze conjugate, EOMi, no nystagmus   V: Sensation intact and symmetric to light touch V1-V3   VII: Symmetric facial motor function bilaterally   VIII: Intact to finger rub bilaterally   IX, X: Palate elevates symmetrically    XI: Normal shrug bilaterally  XII: Tongue protrudes midline  The corneal reflexes were not tested  Motor:   5/5 muscle strength against resistance throughout.    Normal bulk and tone.   No fasciculations, myotonia or other abnormal movements.   No pronator drift   Sensation:   Light touch: Intact and symmetric in the bilateral upper and lower extremities.  Coordination:   Rapid alternating movements are normal, no dysdiadochokinesia  Gait:   Antalgic gait       Labs:  Lab Results   Component Value Date    NA 133 (L) 02/13/2020    K 4.5 02/13/2020    CL 92 (L) 02/13/2020    CO2 24 02/13/2020    GLUCOSE 314 (H) 02/13/2020    CREAT 0.94 02/13/2020    BUN 23 (H) 02/13/2020    CALCIUM 9.8 02/13/2020     Lab Results   Component Value Date    ALT 29 02/13/2020    AST 42 02/13/2020    ALKPHOS 71 02/13/2020    BILITOT 0.4 02/13/2020     Lab Results   Component Value Date    WBC 5.49 02/13/2020    HGB 11.7 (L) 02/13/2020    HCT 35.8 (L) 02/13/2020    MCV 94.0 02/13/2020    PLT 314 02/13/2020       IMAGING STUDIES:      CT NECK ANGIOGRAM W CONTRAST: 03/06/20  IMPRESSION:  ???  1. Patent carotid and vertebral arteries of the neck, without significant stenosis or dissection.  ???  2. Apparent 2 mm vascular outpouching arising from the right A1-anterior communicating artery junction, which may represent a tiny aneurysm. Recommend further evaluation with CTA or MRA of the head.  ???  3. Solid 6 mm pulmonary nodule in the left upper lobe and 3 mm nodule in the left lower lobe, indeterminate in etiology. Fleischner guidelines recommend follow-up imaging at 6 months in higher risk individuals (ever smokers > 68 years of age) using LOW DOSE (NON-SCREENING) INDETERMINATE NODULE scanning technique, and then at 18 months if the nodule remains stable OR follow-up at 12 months using LOW DOSE (NON-SCREENING) INDETERMINATE NODULE scanning technique in low risk individuals.  ???    ASSESSMENT/DISCUSSION: Chad Watkins is a 75 y.o. patient with history of HTN, DM1 and,aortic atherosclerosis  who was referred for evaluation of an incidentally revealed 2 mm vascular outpouching arising from the right A1-anterior communicating artery junction, which may represent a tiny aneurysm. The patient denies neurological complaints. Denies smoking cigarettes, excessive alcohol consumption, and family history of aneurysms. PHASES score of 6 reveals a 1.7% 5 year risk of rupture. Given the patient's findings I have recommended MRA brain in 08/2020 to evaluate for changes in size and shape of the aneurysm.     The risks, benefits, side effects, and alternatives of therapy were discussed with the patient, who verbalizes understanding and agrees with the plan.  We discussed as well the natural history of aneurysms, consequences of rupture, and the risks/benefits of treatment.  The patient was educated about the signs and symptoms of stroke and brain hemorrhage and the need to call immediately 911 in that event. The patient verbalized understanding, and is to return to our clinic according to our discussion after the new imaging or sooner with any new neurological symptoms. I spent 50% of my 60 minutes face-to-face with the patient discussing the examination, therapy, epidemiology, history, and natural course of the patient's condition.     PLAN:    1. MRA  brain w wo contrast 08/2020    I, Abelino Derrick, MD,personally reviewed the history and examined the patient.  I agree with the findings and the assessment and plan of care as documented in this note.

## 2020-04-24 ENCOUNTER — Ambulatory Visit: Payer: BLUE CROSS/BLUE SHIELD

## 2020-04-24 DIAGNOSIS — E103293 Type 1 diabetes mellitus with mild nonproliferative diabetic retinopathy without macular edema, bilateral: Secondary | ICD-10-CM

## 2020-04-24 DIAGNOSIS — H40003 Preglaucoma, unspecified, bilateral: Secondary | ICD-10-CM

## 2020-04-24 NOTE — Progress Notes
Assessment and Plan     Problem List        Eye / Vision Problems    1. 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  The importance of tight blood   sugar (goal HgA1c <7) and blood pressure (<140/80) was emphasized. Patient   understands the need for glucose monitoring/control, compliance with   treatment and recommendations, and need for regular dilated fundus   examinations, at least annually.         2. Phyisological cupping of optic disc, right     Overview      Right nerve is larger than the left.  RNFL OCT 02/14/2019 normal OU.  04/24/2020 normal, both eyes.  -> repeat oct in 1 year.         3. Pseudophakia     Overview      Multifocal IOLs.  Not happy with near vision.  Increase OTC readers to +2.50 to +3.00.  04/24/2020 mild posterior capsular opacity (PCO), consider Nd:YAG laser   capsulotomy.                      Orders:  Orders Placed This Encounter    OCT OPTIC NERVE HEIDELBERG OU (BOTH EYES)     Technician Instructions:     Scheduling Instructions:      Patient Instructions:       Follow ups:  Return in about 1 year (around 04/24/2021) for annual exam.         I discussed the above assessment and plan with the patient. He had the opportunity to ask questions, and his questions and concerns were addressed. He was reminded to call if there is any significant change or worsening in vision, or to get an evaluation, urgently if appropriate.    Author:   Audrie Lia. Allee Busk, OD  This note details the assessment and plan of the encounter for this date. For a complete note and record of the encounter, please see the Encounter Summary.

## 2020-04-25 ENCOUNTER — Ambulatory Visit: Payer: BLUE CROSS/BLUE SHIELD

## 2020-04-25 DIAGNOSIS — S60419A Abrasion of unspecified finger, initial encounter: Secondary | ICD-10-CM

## 2020-04-25 DIAGNOSIS — S6000XA Contusion of unspecified finger without damage to nail, initial encounter: Principal | ICD-10-CM

## 2020-04-25 DIAGNOSIS — S40021A Contusion of right upper arm, initial encounter: Secondary | ICD-10-CM

## 2020-04-26 ENCOUNTER — Telehealth: Payer: BLUE CROSS/BLUE SHIELD

## 2020-04-26 ENCOUNTER — Inpatient Hospital Stay
Admit: 2020-04-26 | Discharge: 2020-04-26 | Disposition: A | Discharge status: Home / Self Care | Payer: BLUE CROSS/BLUE SHIELD | Source: Home / Self Care

## 2020-04-26 MED ORDER — IBUPROFEN 600 MG PO TABS
600 mg | ORAL_TABLET | Freq: Four times a day (QID) | ORAL | 0 refills | Status: AC | PRN
Start: 2020-04-26 — End: ?

## 2020-04-26 MED ORDER — ACETAMINOPHEN-CODEINE #3 300-30 MG PO TABS
1-2 | ORAL_TABLET | Freq: Four times a day (QID) | ORAL | 0 refills | Status: AC | PRN
Start: 2020-04-26 — End: ?

## 2020-04-26 NOTE — ED Provider Notes
[facility]  Emergency Department Service Report    Chad Watkins 75 y.o. male , presents with Hand Injury      Triage   Arrived on 04/25/2020 at 6:34 PM   Arrived by Walk-in [14]    ED Triage Vitals   Temp Temp Source BP Heart Rate Resp SpO2 O2 Device Pain Score Weight   04/25/20 1836 04/25/20 1836 04/25/20 1836 04/25/20 1836 04/25/20 1836 04/25/20 1836 04/25/20 1836 -- 04/25/20 1839   37 ???C (98.6 ???F) Oral 123/71 86 18 97 % None (Room air)  68 kg (150 lb)       Pre hospital care:       No Known Allergies    History   Patient is a 75 y.o. male with hx of HTN, DM, and peripheral vascular disease who presents to the ED with complaint of R hand pain onset today s/t slamming it in freezer door. Pain is constant, moderate, worse with movement, non-radiating. Had stiches to same hand 5 months ago after falling.    The history is provided by the patient. No language interpreter was used.   Hand Injury   The incident occurred 3 to 5 hours ago. The incident occurred at work. The injury mechanism was compression (freezer door). The pain is present in the right fingers and upper right arm. The pain is moderate. The pain has been constant since the incident. The symptoms are aggravated by movement.            Past Medical History:   Diagnosis Date   ??? Aortic atherosclerosis (HCC/RAF) 03/13/2020   ??? Colon polyps-2020 09/19/2019   ??? Diabetic neuropathy (HCC/RAF) 01/27/2018   ??? Diabetic peripheral vascular disease (HCC/RAF) 09/03/2017   ??? DM type 1 with diabetic mixed hyperlipidemia (HCC/RAF) 09/03/2017   ??? Edema leg 07/14/2018   ??? History of fracture of left ankle 12/01/2017   ??? Hypertension associated with diabetes (HCC/RAF) 09/03/2017   ??? Long term (current) use of insulin (HCC/RAF) 09/19/2019   ??? Peripheral vascular disease (HCC/RAF) 09/03/2017   ??? Tortuous aorta (HCC/RAF) 03/13/2020   ??? Trigger thumb 01/27/2018   ??? Type 1 diabetes mellitus with diabetic neuropathy, unspecified (HCC/RAF) 09/03/2017        Past Surgical History:   Procedure Laterality Date   ??? APPENDECTOMY          Past Family History   Family history reviewed by me and there is no pertinent past family history related to the patient's current case and/or care.       Past Social History   he reports that he has never smoked. He has never used smokeless tobacco. He reports current alcohol use of about 4.2 oz of alcohol per week. No history on file for drug use and sexual activity.     Review of Systems   Musculoskeletal: Positive for arthralgias (R hand/fingers).   Skin: Positive for wound (fingers).   All other systems reviewed and are negative.      Physical Exam   Physical Exam  Vitals and nursing note reviewed.   Constitutional:       General: He is not in acute distress.     Appearance: He is well-developed.   HENT:      Head: Normocephalic and atraumatic.   Eyes:      Conjunctiva/sclera: Conjunctivae normal.   Cardiovascular:      Rate and Rhythm: Normal rate.   Pulmonary:      Effort: Pulmonary effort  is normal. No respiratory distress.   Musculoskeletal:         General: No deformity. Normal range of motion.      Right shoulder: Normal range of motion.      Right elbow: Normal range of motion.      Right hand: Normal range of motion (flexion extension of all digits intact).      Cervical back: Normal range of motion and neck supple.   Skin:     General: Skin is warm and dry.      Findings: Abrasion (over R hand 2nd, 3rd, 4th digits) and bruising (hemaomta to R mid humerous) present.   Neurological:      Mental Status: He is alert and oriented to person, place, and time.   Psychiatric:         Behavior: Behavior normal.         ED Course          Laboratory Results   Labs Reviewed - No data to display    Imaging Results     XR humerus ap+lat right (2 views)   Final Result by Ledon Snare., DO (04/07 2008)   IMPRESSION:      Right hand:   Tiny densities distal to the third distal phalanx, could relate to old injury. Correlate for focal tenderness. Otherwise, no acute fracture or dislocation. Mild degenerative changes, most pronounced at the DIP and radiocarpal joints.      Right humerus:   No acute fracture or dislocation.            Signed by: Ledon Snare   04/25/2020 8:08 PM      XR hand pa+lat+obl right (3 views)   Final Result by Ledon Snare., DO (04/07 2008)   IMPRESSION:      Right hand:   Tiny densities distal to the third distal phalanx, could relate to old injury. Correlate for focal tenderness.   Otherwise, no acute fracture or dislocation. Mild degenerative changes, most pronounced at the DIP and radiocarpal joints.      Right humerus:   No acute fracture or dislocation.            Signed by: Ledon Snare   04/25/2020 8:08 PM          Administered Medications     Medication Administration from 04/25/2020 1834 to 04/28/2020 1733     None          Procedures   Procedural Sedation  Procedures    MDM  Number of Diagnoses or Management Options     Amount and/or Complexity of Data Reviewed  Tests in the radiology section of CPT???: ordered and reviewed  Review and summarize past medical records: yes (HTN, DM, peripheral vascular disease)  Independent visualization of images, tracings, or specimens: yes      Data Reviewed/Counseling: I have reviewed the patient's vital signs, nursing notes and old medical records. I had a detailed discussion with the patient regarding the historical points, exam findings, and any diagnostic results supporting the discharge diagnosis. I also discussed radiology results and the need to return to the ED if symptoms worsen or if there are any questions or concerns that arise at home.        MDM:  Patient is a 75 year old male presents emergency room with chief complaint of right hand pain status post trauma.  Patient with flexion extension of all digits intact.  X-ray of the affected hand was done showing  no evidence of acute fracture or dislocation or any other acute abnormality.  Patient suffered some abrasions.  Patient also with trauma to the right upper arm.  Patient with hematoma in this area.  X-ray done of the affected area showing no evidence of acute abnormality.  Patient is stable for discharge at this time.  Clinical Impression     1. Contusion of finger of right hand, unspecified finger, initial encounter    2. Abrasion of finger, initial encounter    3. Traumatic hematoma of right upper arm, initial encounter        Prescriptions     Discharge Medication List as of 04/25/2020  9:04 PM      START taking these medications    Details   !! acetaminophen-codeine (TYLENOL #3) 300-30 mg tablet Take 1-2 tablets by mouth every six (6) hours as needed. Max Daily Amount: 8 tablets, Starting Thu 04/25/2020, Normal      ibuprofen 600 mg tablet Take 1 tablet (600 mg total) by mouth every six (6) hours as needed., Starting Thu 04/25/2020, Normal       !! - Potential duplicate medications found. Please discuss with provider.          Disposition and Follow-up   Disposition: Discharge [1]    Future Appointments   Date Time Provider Department Center   04/30/2020 10:00 AM Jossie Ng., MD ORT HAND [facility]   05/10/2020 10:30 AM Lubertha Sayres., MD [phone] MEDICINE       Follow up with:  Trena Platt., DO  [address]  [address]  [place] [address]  [phone]    In 2 days        Return precautions are specified on After Visit Summary.        The documentation on this chart was performed by Hilda Blades, scribed for Gwenith Daily., MD    04/25/2020 8:58 PM     All scribe entries and documentation made by the scribe were entered at my direction.  I have reviewed this medical record and agree to the accuracy and completeness of the content entered by the scribe.  The documentation recorded by the scribe accurately reflects the service I personally performed and the decisions made by me.             Virl Cagey D., MD  04/28/20 [phone]

## 2020-04-26 NOTE — Telephone Encounter
PDL Call to Practice    Reason for Call:Paitent was seen in ER lastnight due to reinjuring and opening up a suture Dr Purcell Nails put in a couple months ago  He re-opened his wound and would like to follow up asap for a wound check and a hospital follow up    Appointment Related?  [x]  Yes  []  No     If yes;  Date:  Time:    Call warm transferred to PDL: []  Yes  [x]  No  Emailed Designer, television/film set Received by Practice Representative:

## 2020-04-26 NOTE — Telephone Encounter
Called home number an left message. Also, called number left by agent [phone] and person that answered stated no appt was requested for patient. I advised her maybe another relative called? She will call back if appointment needed.

## 2020-04-26 NOTE — ED Notes
ETT still working on the patient's hand; Patient DC home awake, alert oriented not in distress; ambulatory. Discharge instructions given; verbalized understanding. Prescriptions sent to pharmacy. Family member with the patient.

## 2020-04-30 ENCOUNTER — Ambulatory Visit: Payer: BLUE CROSS/BLUE SHIELD | Attending: Hand Surgery

## 2020-04-30 DIAGNOSIS — S5011XA Contusion of right forearm, initial encounter: Secondary | ICD-10-CM

## 2020-04-30 NOTE — Consults
DATE OF SERVICE:  04/30/2020     HISTORY OF PRESENT ILLNESS:  This 75 year old works at [employer] was struck when a door was opened by someone when he was in front of it and had a contusion of his right hand in the knuckle area and just above the elbow and the arm. He was seen in [place] and then referred here.    ALLERGIES:  He has no known allergies.    PAST MEDICAL HISTORY:  He has been seen for diabetes type 1, hypertension, colon polyp, aortic atherosclerosis, peripheral vascular disease, trigger thumb, pseudophakia.    IMAGING:  X-rays of the right hand did show some mild ST-T joint osteoarthritis, otherwise unremarkable.    PHYSICAL EXAMINATION:  There are some healing abrasions of the PIP joints of the fingers with full motion of the fingers. There is ecchymoses in the upper arm just above the elbow in the area where he was struck with a door handle. This is slightly tender but there is full motion of his elbow.    DISCUSSION:  I discussed diagnosis and prognosis with him and with his accompanying family member and have provided a work note for him to return on the 19th and disable him at present. If he has any further problems, he will notify me.      Lorin Glass, MD [phone]        JL/MODL CONF#: [id number]  D: 04/30/2020 10:10:17 T: 04/30/2020 11:01:47 DOCUMENT: [id number]

## 2020-05-01 MED ORDER — FIASP FLEXTOUCH 100 UNIT/ML SC SOPN
0 refills
Start: 2020-05-01 — End: ?

## 2020-05-01 MED ORDER — BD INSULIN SYRINGE U/F 31G X 5/16'' 0.3 ML MISC
6 refills
Start: 2020-05-01 — End: ?

## 2020-05-02 NOTE — Progress Notes
Current functional status unknown.  All goals not met as patient did not return and formal discharge not able to be completed.  Please see last note for additional details.  No current or future physical therapy appointments scheduled at this time.    KYLE MITCHEL NEWBREY, PT

## 2020-05-06 ENCOUNTER — Ambulatory Visit: Payer: BLUE CROSS/BLUE SHIELD

## 2020-05-10 ENCOUNTER — Ambulatory Visit: Payer: BLUE CROSS/BLUE SHIELD

## 2020-05-10 DIAGNOSIS — E1065 Type 1 diabetes mellitus with hyperglycemia: Secondary | ICD-10-CM

## 2020-05-10 DIAGNOSIS — E785 Hyperlipidemia, unspecified: Secondary | ICD-10-CM

## 2020-05-10 DIAGNOSIS — I10 Essential (primary) hypertension: Secondary | ICD-10-CM

## 2020-05-10 NOTE — Patient Instructions
1) Continuar Lantus 7 en la manana  2) Si vas a tomar alcohol, necesitas bajar tu Humalog insulina por 50% O 2 unidades  3) Si la azucar la tienes >200 mg/dl antes de comer, ponte la insulina y despues comer a los 30 minutos.      Para desayuno    Blood glucose   Humalog Insulin  101-200    4  Units  201-300    5 units  >301     6 units     Para [place] y [place]    Blood glucose   Humalog Insulin  101-200    3  Units  201-300    4 units  >301     5 units

## 2020-05-10 NOTE — Progress Notes
PATIENT: Chad Watkins  MRN: [id number]  DOB: July 04, 1945  DATE OF SERVICE: 05/10/2020    REFERRING PRACTITIONER: Lubertha Sayres., MD   PRIMARY CARE PROVIDER: Trena Platt., DO    Reason for Consultation: Type 1 Diabetes Mellitus    Subjective:     History of Present Illness  Chad Watkins is a 75 y.o. male with type 1 diabetes mellitus. The patient presents for follow-up to the [facility].       Type 1 diabetes was diagnosed in 1989, and treated with insulin then.     He denies hx of DKA, but has had ''comas'' attributed to hypoglycemia that required paramedic visits, twice in July 2019.      Interval events:  Here for follow-up appointment.     Patient reports that he feels better when his blood sugars are high.    He feels like he is having low blood sugars when his blood sugars are in the low 100 mg/dL range.      Following his DM regimen.   Denies any low blood sugars.     Patient is testing BG 4x daily when not using CGM.   Patient uses insulin 4x daily.     When patient has CGM device, patient uses it consistently.   Patient tests blood glucose to calibrate their CGM.   Patient continues to dose insulin >3x/day and frequently adjusts insulin dosage based on the CGM device information.      Current DM Medications  Continuar Lantus 7 units en la manana    Para desayuno    Blood glucose   Humalog Insulin  101-200    4  Units  201-300    5 units  >301     6 units     Para Lonche y Cena    Blood glucose   Humalog Insulin  101-200    3  Units  201-300    4 units  >301     5 units     Glucose Review:  Data from CGM:      [employer] Personal CGM Physician Interpretation Report  I reviewed the report from the download of the CGM and the sensor data.  Data was reviewed for 04/27/2020 through 05/10/2020  The average was 219 mg/dL with a coefficient variation of 30.2 %; Time in range 30.9%, below 70 mg/dL 0%, very low below 54 mg/dL 0%, above 478 mg/dL 29.5%, above 621 mg/dL 30.8%.  In examining the patterns and providing interpretation, I noted normoglycemia in the morning. Sometimes having elevated fasting sugars due to PP hyperglycemia after dinner.  Having variable PP blood sugars due to carb and insulin mismatch. Based on CGM interpetration, I recommend to continue to use appropriate corrective mealtime insulin scale. Discussed that given his longstanding high blood sugars, his body may need time to adjust to normal blood sugars and not feel symptoms of hypoglycemia.  Will continue same regimen.    Exercise:   Walks. On his feet 8 hrs at work all day.      Complications & Associated Co-morbidities Yes No Comments/Treatment to Date   Retinopathy  []    [x]      Nephropathy  []    [x]      Neuropathy  []    [x]      Gastroparesis  []   [x]     CAD  []    [x]      Stroke  []    [x]      PVD  []   [  x]    Sleep Apnea  [x]    []   Not sure. Poor sleep?       Past Medical History  Past Medical History:   Diagnosis Date   ??? Aortic atherosclerosis (HCC/RAF) 03/13/2020   ??? Colon polyps-2020 09/19/2019   ??? Diabetic neuropathy (HCC/RAF) 01/27/2018   ??? Diabetic peripheral vascular disease (HCC/RAF) 09/03/2017   ??? DM type 1 with diabetic mixed hyperlipidemia (HCC/RAF) 09/03/2017   ??? Edema leg 07/14/2018   ??? History of fracture of left ankle 12/01/2017   ??? Hypertension associated with diabetes (HCC/RAF) 09/03/2017   ??? Long term (current) use of insulin (HCC/RAF) 09/19/2019   ??? Peripheral vascular disease (HCC/RAF) 09/03/2017   ??? Tortuous aorta (HCC/RAF) 03/13/2020   ??? Trigger thumb 01/27/2018   ??? Type 1 diabetes mellitus with diabetic neuropathy, unspecified (HCC/RAF) 09/03/2017       Past Surgical History  Past Surgical History:   Procedure Laterality Date   ??? APPENDECTOMY         Medications  Current Outpatient Medications   Medication Sig   ??? acetaminophen-codeine (TYLENOL #3) 300-30 mg tablet TAKE 1 TABLET EVERY 4 HOURS AS NEEDED FOR PAIN   ??? acetaminophen-codeine (TYLENOL #3) 300-30 mg tablet Take 1-2 tablets by mouth every six (6) hours as needed. Max Daily Amount: 8 tablets   ??? AMLODIPINE-BENAZEPRIL 5-20 mg capsule TAKE 1 CAPSULE BY MOUTH TWO (2) TIMES DAILY.   ??? cephalexin 500 mg capsule Take 1 capsule (500 mg total) by mouth four (4) times daily.   ??? ciclopirox 8% solution Apply topically at bedtime Apply over nail and surrounding skin.Marland Kitchen   ??? FLUZONE HIGH-DOSE 0.5 ML syringe    ??? FUROSEMIDE 20 mg tablet TAKE 1 TABLET BY MOUTH EVERY DAY IN THE MORNING   ??? gatifloxacin 0.5% ophthalmic solution    ??? glucagon (BAQSIMI TWO PACK) 3 mg/dose nasal powder 1 actuation (3 mg total) by Left Nare route every fifteen (15) minutes as needed.   ??? glucose blood test strip Use as instructed   ??? ibuprofen 600 mg tablet Take 1 tablet (600 mg total) by mouth every six (6) hours as needed.   ??? insulin glargine (LANTUS) 100 units/mL injection vial USE 5 UNITS EVERY MORNING AND 3 UNITS EVERY EVENING.   ??? insulin lispro, HumaLOG, (HUMALOG) 100 unit/mL injection vial 3 units sq qac plus insulin sliding scale patient has.  Hold for sugar <100.   ??? insulin lispro, HumaLOG, (HUMALOG) 100 unit/mL injection vial 3 UNITS EACH MEAL PLUS AMOUNT DETERMINED BY MD'S SLIDING SCALE; HOLD FOR SUGAR UNDER 100.   ??? insulin syringe needle U-100 (BD INSULIN SYRINGE U/F) 31G X 5/16'' 0.3 mL syringe Up to 7 times a day.   ??? LOTEMAX SM 0.38 % GEL    ??? metFORMIN (GLUCOPHAGE XR) 500 mg PO ER 24 hr tablet Take 2 tablets (1,000 mg total) by mouth two (2) times daily with meals.   ??? ONETOUCH ULTRA test strip USE 1 TEST STRIP TESTING 5 TIMES A DAY.   ??? PROLENSA 0.07 % SOLN    ??? SHINGRIX 50 MCG/0.5ML injection    ??? SILDENAFIL 100 mg tablet TOME UNA TABLETA TODOS LOS DIAS CUANDO SEA NECESARIO FOR ERECTILE DYSFUNCTION   ??? simvastatin (ZOCOR) 20 mg tablet TOME UNA TABLETA DIARIAMENTE   ??? SIMVASTATIN 20 mg tablet TOME UNA TABLETA TODOS LOS DIAS AL ACOSTARSE     No current facility-administered medications for this visit.     Allergies  Patient has no known  allergies.    Family History  No family history on file.    Social History  Social History     Socioeconomic History   ??? Marital status: Married     Spouse name: Not on file   ??? Number of children: 4   ??? Years of education: Not on file   ??? Highest education level: Not on file   Occupational History   ??? Occupation: Soil scientist    Tobacco Use   ??? Smoking status: Never Smoker   ??? Smokeless tobacco: Never Used   Substance and Sexual Activity   ??? Alcohol use: Yes     Alcohol/week: 4.2 oz     Types: 7 Standard drinks or equivalent per week   ??? Drug use: Not on file   ??? Sexual activity: Not on file   Other Topics Concern   ??? Not on file   Social History Narrative   ??? Not on file     Social Determinants of Health     Financial Resource Strain: Not on file   Physical Activity: Not on file   Stress: Not on file       Review of Systems:  A 14 point Review of Systems conducted was not significant other than as discussed in HPI above.     Objective:      Vitals: BP 90/49  ~ Pulse 85  ~ Ht 5' 4'' (1.626 m)  ~ Wt 149 lb (67.6 kg)  ~ BMI 25.58 kg/m???    Wt Readings from Last 3 Encounters:   05/10/20 149 lb (67.6 kg)   04/25/20 150 lb (68 kg)   04/01/20 147 lb (66.7 kg)     Constitutional: alert and oriented, no acute distress.  Eye: no conjunctival injection. no proptosis, lid lag, or stare.  HEENT: Ears are normal without deformaties. MMM  ENMT: Trachea is midline. Thyroid appears normal in size.    Pulm: Respirations are non-labored.   MSK: Normal gait.    Integumentary: Skin intact. No rashes.   Neuro: No focal deficits noted. no hand tremor.   Psych: Cooperative. Appropriate mood and affect.     Lab Review:  Lab Results   Component Value Date    HGBA1C 8.6 (H) 02/13/2020    HGBA1C 7.4 (H) 09/19/2019    HGBA1C 7.5 05/04/2019      Lab Results   Component Value Date    GLUCOSE 314 (H) 02/13/2020    ALBCREATUR  02/13/2020      Comment:      Unable to calculate. Test result is???below detection???limit.    CHOLDLQ 80 01/27/2018    CHOLDLCAL 52 02/13/2020    CPEPTIDE <0.2 (L) 09/03/2017 Lab Results   Component Value Date    CREAT 0.94 02/13/2020    BUN 23 (H) 02/13/2020    NA 133 (L) 02/13/2020    K 4.5 02/13/2020    CL 92 (L) 02/13/2020    CO2 24 02/13/2020      Lab Results   Component Value Date    WBC 5.49 02/13/2020    HGB 11.7 (L) 02/13/2020    HCT 35.8 (L) 02/13/2020    MCV 94.0 02/13/2020    PLT 314 02/13/2020      No results found for: OSMOLALITY   Lab Results   Component Value Date    KETONESUR Negative 02/13/2020      No results found for: PHVEN, PHART   Lab Results   Component Value Date  CHOL 192 02/13/2020    CHOLHDL 127 02/13/2020    CHOLDLQ 80 01/27/2018    CHOLDLCAL 52 02/13/2020    TRIGLY 67 02/13/2020        Assessment/Plan:    75 y.o. male presenting to the [facility] with the following issues:    1. Type 1 Diabetes Mellitus  Lab Results   Component Value Date    HGBA1C 8.6 (H) 02/13/2020    HGBA1C 7.4 (H) 09/19/2019    HGBA1C 7.5 05/04/2019     Uncontrolled T1DM.  No known DM complications. Complicated by severe hypoglycemia.    A1C goal < 7.5%: to reduce risk of diabetes related complications to the eyes, feet and kidneys with additional goal of hypoglycemia risk reduction.  On CGM, I noted normoglycemia in the morning. Sometimes having elevated fasting sugars due to PP hyperglycemia after dinner.  Having variable PP blood sugars due to carb and insulin mismatch. Based on CGM interpetration, I recommend to continue to use appropriate corrective mealtime insulin scale. Discussed that given his longstanding high blood sugars, his body may need time to adjust to normal blood sugars and not feel symptoms of hypoglycemia.  Will continue same regimen.   -Please see new DM regimen below in patient instructions   -High frequency glucose monitoring with CGM   -Hypoglycemia protocol reviewed. ''Rule of 15'' handout given also. Carry glucose tabs at all times.   -Check glucose prior to driving. Glucose must be > 100 mg/dl. Reviewed risks related to driving with hypoglycemia   -Glucagon reviewed.  Baqsimi (glucagon) nasal spray if possible.  Ordered.  Waiting for coupon.     2. Hypertension   -Continue current regimen, including ACE-I inhibitor or ARB therapy    3. Hyperlipidemia   Per AHA guidelines, statin therapy is recommended with history of diabetes mellitus   -Continue statin therapy.    4. Diabetic microvascular disease screening:  Health Maintenance   Topic Date Due   ??? Diabetes: Hemoglobin A1c Blood Test  08/12/2020   ??? Diabetes: Kidney Monitoring  03/05/2021   ??? Diabetes: Dilated Eye Exam  04/24/2021     5. ETOH use.  Drinking 1 shot in the evenings.  Hypoglycemia precautions discussed with patient.     6. Sleep Disorder.  Dtr suspects OSA.  See PMD re: eval.    No orders of the defined types were placed in this encounter.    I spent 40 minutes counseling and coordinating care for the items checked below on the day of service  [x]  Preparing to see the patient (e.g., review of tests)  [x]  Obtaining and or reviewing separately obtained history   [x]  Performing a medically appropriate examination and/or evaluation   [x]  Counseling and educating the patient/family/caregiver   [x]  Ordering medications, tests, or procedures  []  Referring and communicating with other healthcare professionals (when not separately reported)  [x]  Documenting clinical information in the EHR  [x]  Independently interpreting results and communicating results to patient/ family/caregiver    Author: Erie Noe A. Noreene Filbert, MD 05/10/2020 10:49 AM     Patient Instructions   1) Continuar Lantus 7 en la manana  2) Si vas a tomar alcohol, necesitas bajar tu Humalog insulina por 50% O 2 unidades  3) Si la azucar la tienes >200 mg/dl antes de comer, ponte la insulina y despues comer a los 30 minutos.      Para desayuno    Blood glucose   Humalog Insulin  101-200  4  Units  201-300    5 units  >301     6 units     Para Lonche y Cena    Blood glucose   Humalog Insulin  101-200    3  Units  201-300    4 units  >301     5 units

## 2020-05-13 ENCOUNTER — Ambulatory Visit: Payer: BLUE CROSS/BLUE SHIELD

## 2020-05-13 DIAGNOSIS — I152 Hypertension secondary to endocrine disorders: Secondary | ICD-10-CM

## 2020-05-13 DIAGNOSIS — E1159 Type 2 diabetes mellitus with other circulatory complications: Secondary | ICD-10-CM

## 2020-05-13 DIAGNOSIS — Z0000 Encounter for general adult medical examination without abnormal findings: Secondary | ICD-10-CM

## 2020-05-14 MED ORDER — INSULIN SYRINGE-NEEDLE U-100 31G X 5/16'' 0.3 ML MISC
6 refills | Status: AC
Start: 2020-05-14 — End: ?

## 2020-05-21 MED ORDER — FIASP FLEXTOUCH 100 UNIT/ML SC SOPN
0 refills
Start: 2020-05-21 — End: ?

## 2020-05-22 MED ORDER — INSULIN SYRINGE-NEEDLE U-100 31G X 5/16'' 0.3 ML MISC
6 refills | Status: AC
Start: 2020-05-22 — End: ?

## 2020-06-02 MED ORDER — AMLODIPINE BESY-BENAZEPRIL HCL 5-20 MG PO CAPS
1 | ORAL_CAPSULE | Freq: Two times a day (BID) | ORAL | 0 refills | 60.00000 days
Start: 2020-06-02 — End: ?

## 2020-06-03 MED ORDER — AMLODIPINE BESY-BENAZEPRIL HCL 5-20 MG PO CAPS
1 | ORAL_CAPSULE | Freq: Two times a day (BID) | ORAL | 0 refills | 60.00000 days | Status: AC
Start: 2020-06-03 — End: ?

## 2020-06-04 MED ORDER — SIMVASTATIN 20 MG PO TABS
ORAL_TABLET | 2 refills | Status: AC
Start: 2020-06-04 — End: ?

## 2020-06-28 MED ORDER — FUROSEMIDE 20 MG PO TABS
ORAL_TABLET | 0 refills | Status: AC
Start: 2020-06-28 — End: ?

## 2020-07-25 ENCOUNTER — Ambulatory Visit: Payer: BLUE CROSS/BLUE SHIELD

## 2020-07-25 DIAGNOSIS — H02403 Unspecified ptosis of bilateral eyelids: Secondary | ICD-10-CM

## 2020-07-25 DIAGNOSIS — E103292 Type 1 diabetes mellitus with mild nonproliferative diabetic retinopathy without macular edema, left eye: Secondary | ICD-10-CM

## 2020-07-25 DIAGNOSIS — H26493 Other secondary cataract, bilateral: Secondary | ICD-10-CM

## 2020-07-25 NOTE — Progress Notes
Assessment and Plan     Problem List        Eye / Vision Problems    1. Mild nonproliferative diabetic retinopathy (HCC/RAF)     Overview      None right eye.  Very minimal left eye.  Maintain good blood sugar and blood pressure under direction of diabetes   doctor.               2. Phyisological cupping of optic disc, right     Overview      Right nerve is larger than the left.  RNFL OCT 02/14/2019 normal OU.  04/24/2020 normal, both eyes.  -> repeat oct in 1 year.             3. Posterior capsular opacification     Overview      Visually significant both eyes    Risks/benefits/questions reviewed  yag capsulotomy both eyes            4. Pseudophakia     Overview      Multifocal IOLs.  Not happy with near vision, feels worse last year    Optometry consult after yag cap both eyes             Pseudophakia       Phyisological cupping of optic disc, right       Mild nonproliferative diabetic retinopathy (HCC/RAF)              Orders:  No orders of the defined types were placed in this encounter.      Follow ups:  Return for schedule yag laser capsulotomy OD first then OS; imed please.         I discussed the above assessment and plan with the patient. He had the opportunity to ask questions, and his questions and concerns were addressed. He was reminded to call if there is any significant change or worsening in vision, or to get an evaluation, urgently if appropriate.    Author:   Lenice Pressman, MD  This note details the assessment and plan of the encounter for this date. For a complete note and record of the encounter, please see the Encounter Summary.

## 2020-07-25 NOTE — Progress Notes
Assessment and Plan     Problem List        Eye / Vision Problems    1. Mild nonproliferative diabetic retinopathy (HCC/RAF)     Overview      None right eye.  Very minimal left eye.  Maintain good blood sugar and blood pressure under direction of diabetes   doctor.               2. Phyisological cupping of optic disc, right     Overview      Right nerve is larger than the left.  RNFL OCT 02/14/2019 normal OU.  04/24/2020 normal, both eyes.  -> repeat oct in 1 year.             3. Posterior capsular opacification     Overview      Visually significant both eyes    Risks/benefits/questions reviewed with translation and in presence of   daughter  Wishes to proceed with yag capsulotomy    Schedule yag capsulotomy left eye first then right eye            4. Pseudophakia     Overview      Multifocal IOLs.  Not happy with near vision, feels worse last year    Optometry consult after yag cap both eyes             Pseudophakia       Phyisological cupping of optic disc, right       Mild nonproliferative diabetic retinopathy (HCC/RAF)       Posterior capsular opacification              Orders:  No orders of the defined types were placed in this encounter.      Follow ups:  Return for schedule yag laser capsulotomy OS first then OD; imed please.         I discussed the above assessment and plan with the patient. He had the opportunity to ask questions, and his questions and concerns were addressed. He was reminded to call if there is any significant change or worsening in vision, or to get an evaluation, urgently if appropriate.    Author:   Lenice Pressman, MD  This note details the assessment and plan of the encounter for this date. For a complete note and record of the encounter, please see the Encounter Summary.

## 2020-07-25 NOTE — Progress Notes
Assessment and Plan     Problem List        Eye / Vision Problems    1. Mild nonproliferative diabetic retinopathy (HCC/RAF)     Overview      None right eye.  Very minimal left eye.  Maintain good blood sugar and blood pressure under direction of diabetes   doctor.               2. Phyisological cupping of optic disc, right     Overview      Right nerve is larger than the left.  RNFL OCT 02/14/2019 normal OU.  04/24/2020 normal, both eyes.  -> repeat oct in 1 year.             3. Posterior capsular opacification     Overview      Visually significant both eyes    Risks/benefits/questions reviewed with translation and in presence of   daughter  Wishes to proceed with yag capsulotomy    Schedule yag capsulotomy left eye first then right eye            4. Pseudophakia     Overview      Multifocal IOLs.  Not happy with near vision, feels worse last year    Optometry consult after yag cap both eyes            5. Ptosis of eyelid     Overview      Consider oculoplastics consult if patient wishes            Pseudophakia       Phyisological cupping of optic disc, right       Mild nonproliferative diabetic retinopathy (HCC/RAF)       Posterior capsular opacification              Orders:  No orders of the defined types were placed in this encounter.      Follow ups:  Return for schedule yag laser capsulotomy OS first then OD; imed please.         I discussed the above assessment and plan with the patient. He had the opportunity to ask questions, and his questions and concerns were addressed. He was reminded to call if there is any significant change or worsening in vision, or to get an evaluation, urgently if appropriate.    Author:   Lenice Pressman, MD  This note details the assessment and plan of the encounter for this date. For a complete note and record of the encounter, please see the Encounter Summary.

## 2020-08-05 MED ORDER — METFORMIN HCL ER 500 MG PO TB24
1000 mg | ORAL_TABLET | Freq: Two times a day (BID) | ORAL | 3 refills | Status: AC
Start: 2020-08-05 — End: ?

## 2020-08-13 ENCOUNTER — Inpatient Hospital Stay: Payer: BLUE CROSS/BLUE SHIELD

## 2020-08-13 ENCOUNTER — Telehealth: Payer: BLUE CROSS/BLUE SHIELD

## 2020-08-13 NOTE — Telephone Encounter
Good Morning,     The following pt would like to schedule yag laser capsulotomy OS first then OD. Please contact his daughter Wells Guiles to schedule the procedure.    Thank You!

## 2020-08-30 MED ORDER — AMLODIPINE BESY-BENAZEPRIL HCL 5-20 MG PO CAPS
1 | ORAL_CAPSULE | Freq: Two times a day (BID) | ORAL | 0 refills | 60.00000 days | Status: AC
Start: 2020-08-30 — End: ?

## 2020-09-09 ENCOUNTER — Ambulatory Visit: Payer: BLUE CROSS/BLUE SHIELD

## 2020-09-09 DIAGNOSIS — H6123 Impacted cerumen, bilateral: Secondary | ICD-10-CM

## 2020-09-09 DIAGNOSIS — E103292 Type 1 diabetes mellitus with mild nonproliferative diabetic retinopathy without macular edema, left eye: Secondary | ICD-10-CM

## 2020-09-09 DIAGNOSIS — H26493 Other secondary cataract, bilateral: Secondary | ICD-10-CM

## 2020-09-09 DIAGNOSIS — K635 Polyp of colon: Secondary | ICD-10-CM

## 2020-09-09 DIAGNOSIS — R2689 Other abnormalities of gait and mobility: Secondary | ICD-10-CM

## 2020-09-09 DIAGNOSIS — L739 Follicular disorder, unspecified: Secondary | ICD-10-CM

## 2020-09-09 DIAGNOSIS — H02403 Unspecified ptosis of bilateral eyelids: Secondary | ICD-10-CM

## 2020-09-09 DIAGNOSIS — E1042 Type 1 diabetes mellitus with diabetic polyneuropathy: Secondary | ICD-10-CM

## 2020-09-09 NOTE — Progress Notes
PATIENT: Chad Watkins  MRN: [id number]  DOB: April 24, 1945  DATE OF SERVICE: 09/09/2020    CHIEF COMPLAINT:   Chief Complaint   Patient presents with   ? Follow-up     Per pt. Would like to discuss a few things with the Dr.      DIABETES LEVELS STILL FLUCTUATE THROUGH THE DAY - STILL INCONSISTENT DIET.  HAS DEXCOM CGM.  SEES ENDO TOMORROW    VERY DIFFICULT TO SEE - HAS DIFFICULTY AT WORK AS WELL AS GETTING AROUND THE HOUSE.  DAUGHTER DISTRIBUTES PROPER INSULIN TO HIM.  HAS HAD FALLS.  EYE PROCEDURES NEXT 2 MONTHS    EARS ARE UNCOMFORTABLE AND MUFFLED    PAIN WITH NEW INSERTS FROM PODIATRY, SEES DR. Jonette Pesa    Past Medical History:   Diagnosis Date   ? Aortic atherosclerosis (HCC/RAF) 03/13/2020   ? Colon polyps-2020 09/19/2019   ? Diabetic neuropathy (HCC/RAF) 01/27/2018   ? Diabetic peripheral vascular disease (HCC/RAF) 09/03/2017   ? DM type 1 with diabetic mixed hyperlipidemia (HCC/RAF) 09/03/2017   ? Edema leg 07/14/2018   ? Glaucoma     Suspect   ? History of fracture of left ankle 12/01/2017   ? Hypertension associated with diabetes (HCC/RAF) 09/03/2017   ? Long term (current) use of insulin (HCC/RAF) 09/19/2019   ? Peripheral vascular disease (HCC/RAF) 09/03/2017   ? Tortuous aorta (HCC/RAF) 03/13/2020   ? Trigger thumb 01/27/2018   ? Type 1 diabetes mellitus with diabetic neuropathy, unspecified (HCC/RAF) 09/03/2017       Past Surgical History:   Procedure Laterality Date   ? APPENDECTOMY         Social History     Tobacco Use   Smoking Status Never Smoker   Smokeless Tobacco Never Used       Family History   Problem Relation Age of Onset   ? Glaucoma Neg Hx    ? Macular degeneration Neg Hx        Social History     Substance and Sexual Activity   Alcohol Use Yes   ? Alcohol/week: 4.2 oz   ? Types: 7 Standard drinks or equivalent per week       Social History     Substance and Sexual Activity   Drug Use Not on file       No Known Allergies    Patient Active Problem List   Diagnosis   ? Bursitis   ? Skin cancer   ? Palpitation   ? Need for vaccination   ? Healthcare maintenance   ? Abnormal EKG   ? Left ankle pain   ? Hypertension associated with diabetes (HCC/RAF)   ? DM type 1 with diabetic mixed hyperlipidemia (HCC/RAF)   ? DM (diabetes mellitus), type 1, uncontrolled, periph vascular complic (HCC/RAF)   ? Peripheral vascular disease (HCC/RAF)   ? DM (diabetes mellitus), type 1, uncontrolled w/neurologic complication (HCC/RAF)   ? History of fracture of left ankle   ? Diabetic neuropathy (HCC/RAF)   ? Trigger thumb   ? Edema leg   ? Mild nonproliferative diabetic retinopathy (HCC/RAF)   ? Pseudophakia   ? Phyisological cupping of optic disc, right   ? Long term (current) use of insulin (HCC/RAF)   ? Colon polyps-2020   ? Aortic atherosclerosis (HCC/RAF)   ? Tortuous aorta (HCC/RAF)   ? Controlled type 1 diabetes with mild nonproliferative retinopathy (HCC/RAF)   ? Posterior capsular opacification   ? Ptosis of eyelid  Current Outpatient Medications:   ?  AMLODIPINE-BENAZEPRIL 5-20 mg capsule, TAKE 1 CAPSULE BY MOUTH TWO (2) TIMES DAILY., Disp: 180 capsule, Rfl: 0  ?  cephalexin 500 mg capsule, Take 1 capsule (500 mg total) by mouth four (4) times daily., Disp: 40 capsule, Rfl: 0  ?  ciclopirox 8% solution, Apply topically at bedtime Apply over nail and surrounding skin.., Disp: 1 bottle, Rfl: 3  ?  FUROSEMIDE 20 mg tablet, TAKE 1 TABLET BY MOUTH EVERY DAY IN THE MORNING, Disp: 90 tablet, Rfl: 0  ?  glucagon (BAQSIMI TWO PACK) 3 mg/dose nasal powder, 1 actuation (3 mg total) by Left Nare route every fifteen (15) minutes as needed., Disp: 3 each, Rfl: 1  ?  glucose blood test strip, Use as instructed, Disp: 100 each, Rfl: 12  ?  ibuprofen 600 mg tablet, Take 1 tablet (600 mg total) by mouth every six (6) hours as needed., Disp: 30 tablet, Rfl: 0  ?  insulin glargine (LANTUS) 100 units/mL injection vial, USE 5 UNITS EVERY MORNING AND 3 UNITS EVERY EVENING., Disp: 90 mL, Rfl: 1  ?  insulin lispro, HumaLOG, (HUMALOG) 100 unit/mL injection vial, 3 units sq qac plus insulin sliding scale patient has.  Hold for sugar <100., Disp: 90 mL, Rfl: 2  ?  insulin lispro, HumaLOG, (HUMALOG) 100 unit/mL injection vial, 3 UNITS EACH MEAL PLUS AMOUNT DETERMINED BY MD'S SLIDING SCALE; HOLD FOR SUGAR UNDER 100., Disp: 90 mL, Rfl: 1  ?  insulin syringe needle U-100 (BD INSULIN SYRINGE U/F) 31G X 5/16'' 0.3 mL syringe, UP TO 7 TIMES A DAY.., Disp: 100 each, Rfl: 6  ?  insulin syringe needle U-100 (BD INSULIN SYRINGE U/F) 31G X 5/16'' 0.3 mL syringe, Up to 7 times a day., Disp: 700 each, Rfl: 6  ?  metFORMIN (GLUCOPHAGE XR) 500 mg PO ER 24 hr tablet, TAKE 2 TABLETS (1,000 MG TOTAL) BY MOUTH TWO (2) TIMES DAILY WITH MEALS., Disp: 360 tablet, Rfl: 3  ?  ONETOUCH ULTRA test strip, USE 1 TEST STRIP TESTING 5 TIMES A DAY., Disp: 500 strip, Rfl: 2  ?  SILDENAFIL 100 mg tablet, TOME UNA TABLETA TODOS LOS DIAS CUANDO SEA NECESARIO FOR ERECTILE DYSFUNCTION, Disp: 10 tablet, Rfl: 10  ?  simvastatin (ZOCOR) 20 mg tablet, TOME UNA TABLETA DIARIAMENTE, Disp: 90 tablet, Rfl: 3  ?  SIMVASTATIN 20 mg tablet, TOME UNA TABLETA TODOS LOS DIAS AL ACOSTARSE, Disp: 90 tablet, Rfl: 2  ?  acetaminophen-codeine (TYLENOL #3) 300-30 mg tablet, TAKE 1 TABLET EVERY 4 HOURS AS NEEDED FOR PAIN (Patient not taking: No sig reported), Disp: , Rfl: 0  ?  acetaminophen-codeine (TYLENOL #3) 300-30 mg tablet, Take 1-2 tablets by mouth every six (6) hours as needed. Max Daily Amount: 8 tablets (Patient not taking: No sig reported), Disp: 15 tablet, Rfl: 0  ?  FLUZONE HIGH-DOSE 0.5 ML syringe, , Disp: , Rfl:   ?  gatifloxacin 0.5% ophthalmic solution, , Disp: , Rfl:   ?  LOTEMAX SM 0.38 % GEL, , Disp: , Rfl:   ?  PROLENSA 0.07 % SOLN, , Disp: , Rfl:   ?  SHINGRIX 50 MCG/0.5ML injection, , Disp: , Rfl:     Health Maintenance   Topic Date Due   ? Hepatitis B Screening  Never done   ? Advance Directive  Never done   ? Diabetes: HGB A1C  11/09/2020   ? Annual Preventive Wellness Visit  09/18/2020 ? Influenza Vaccine (1) 09/19/2020   ?  Diabetes: EYE EXAM  07/25/2021   ? Diabetes: NEPHROPATHY MONITORING  08/30/2021   ? Tdap/Td Vaccine (3 - Td or Tdap) 11/13/2029   ? Pneumococcal Vaccine  Completed   ? Hepatitis C Screening  Completed   ? Shingles (Shingrix) Vaccine  Completed   ? COVID-19 Vaccine(Tracks primary and booster doses, not sup/immunocomp)  Completed   ? Statin prescribed for ASCVD Prevention or Treatment  Completed       Patient Care Team:  Trena Platt., DO as PCP - General (Family Medicine)      Subjective:      Chad Watkins is a 75 y.o. male.    Review of Systems   Constitutional: Negative.  Negative for activity change, appetite change, chills, diaphoresis, fatigue, fever and unexpected weight change.   HENT: Negative.  Negative for congestion, dental problem, drooling, ear discharge, ear pain, facial swelling, hearing loss, mouth sores, nosebleeds, postnasal drip, rhinorrhea, sinus pressure, sinus pain, sneezing, sore throat, tinnitus, trouble swallowing and voice change.    Eyes: Positive for visual disturbance. Negative for photophobia, pain, discharge, redness and itching.   Respiratory: Negative.  Negative for apnea, cough, choking, chest tightness, shortness of breath, wheezing and stridor.    Cardiovascular: Negative.  Negative for chest pain, palpitations and leg swelling.   Gastrointestinal: Negative.  Negative for abdominal distention, abdominal pain, anal bleeding, blood in stool, constipation, diarrhea, nausea, rectal pain and vomiting.   Endocrine: Negative.  Negative for cold intolerance, heat intolerance, polydipsia, polyphagia and polyuria.   Genitourinary: Negative.  Negative for decreased urine volume, difficulty urinating, dysuria, enuresis, flank pain, frequency, genital sores, hematuria, penile discharge, penile pain, penile swelling, scrotal swelling, testicular pain and urgency.   Musculoskeletal: Negative.  Negative for arthralgias, back pain, gait problem, joint swelling, myalgias, neck pain and neck stiffness.   Skin: Negative.  Negative for color change, pallor, rash and wound.   Allergic/Immunologic: Negative.  Negative for environmental allergies, food allergies and immunocompromised state.   Neurological: Positive for dizziness and light-headedness. Negative for tremors, seizures, syncope, facial asymmetry, speech difficulty, weakness, numbness and headaches.   Hematological: Negative.  Negative for adenopathy. Does not bruise/bleed easily.   Psychiatric/Behavioral: Negative.  Negative for agitation, behavioral problems, confusion, decreased concentration, dysphoric mood, hallucinations, self-injury, sleep disturbance and suicidal ideas. The patient is not nervous/anxious and is not hyperactive.    All other systems reviewed and are negative.        Objective:      Physical Exam  Vitals reviewed.   Constitutional:       General: He is not in acute distress.     Appearance: Normal appearance. He is well-developed and normal weight. He is not ill-appearing, toxic-appearing or diaphoretic.      Comments: BP 121/61  ~ Pulse 90  ~ Temp 36.4 ?C (97.5 ?F) (Forehead)  ~ Wt 143 lb 6.4 oz (65 kg)  ~ BMI 24.61 kg/m?      HENT:      Head: Normocephalic and atraumatic.      Right Ear: External ear normal.      Left Ear: External ear normal.      Nose: Nose normal.   Eyes:      Conjunctiva/sclera: Conjunctivae normal.   Pulmonary:      Effort: Pulmonary effort is normal.   Musculoskeletal:      Cervical back: Normal range of motion and neck supple. No rigidity. No muscular tenderness.   Lymphadenopathy:      Cervical: No  cervical adenopathy.   Skin:     General: Skin is warm and dry.   Neurological:      General: No focal deficit present.      Mental Status: He is alert and oriented to person, place, and time.   Psychiatric:         Mood and Affect: Mood normal.         Behavior: Behavior normal.         Thought Content: Thought content normal.         Judgment: Judgment normal.             ASSESSMENT AND PLAN     1. 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 Hypertension associated with diabetes (HCC/RAF)    3. DM type 1 with diabetic mixed hyperlipidemia (HCC/RAF)    4. DM (diabetes mellitus), type 1, uncontrolled, periph vascular complic (HCC/RAF)    5. DM (diabetes mellitus), type 1, uncontrolled w/neurologic complication (HCC/RAF)    6. Diabetic polyneuropathy associated with type 1 diabetes mellitus (HCC/RAF)    7. Polyp of colon, unspecified part of colon, unspecified type    8. Tortuous aorta (HCC/RAF)    9. Peripheral vascular disease (HCC/RAF)    10. Aortic atherosclerosis (HCC/RAF)    11. Long term (current) use of insulin (HCC/RAF)    12. Healthcare maintenance    13. Balance disorder    14. Bilateral hearing loss due to cerumen impaction    15. Folliculitis    16. Bilateral posterior capsular opacification    17. Phyisological cupping of optic disc, right    18. Ptosis of both eyelids      BALANCE AND GAIT TRAINING THROUGH PT.  TALK WITH DR. Jonette Pesa REGARDING INSERTS.  GETTING EYE PROCEDURES DONE    WRITE OFF WORK UNTIL GET EYES FIXED AND SEES BETTER, IS UNSAFE AT JOB AT THIS TIME.  HOPEFULLY DURING THIS TIME WILL ALSO EAT MORE CONSISTENTLY.      RECOMMEND HYDRATION AS WELL    Orders Placed This Encounter   ? TBOC - Venipuncture w/Collection & Handling   ? Albumin/Creat Ratio Ur   ? CBC & Auto Differential   ? Folate,Serum   ? Hgb A1c   ? Lipid Panel   ? Vitamin D,25-Hydroxy   ? Vitamin B12   ? Urinalysis w/Reflex to Culture   ? TSH with reflex FT4, FT3   ? PTH, Intact   ? Magnesium   ? Uric Acid   ? Comprehensive Metabolic Panel   ? PSA,Free & Total Profile   ? Testosterone, Bioavailable and Total, Includes Sex Hormone-Binding Globulin (Adult Males or Individuals on Testosterone Hormone Therapy)   ? UA,Dipstick   ? UA,Microscopic   ? CBC   ? Differential, Automated   ? Referral to [facility], Physical Therapy   ? TBOC - Ear Wax Removal, Lavage, Bilateral       Wt Readings from Last 5 Encounters:   09/09/20 143 lb 6.4 oz (65 kg)   05/10/20 149 lb (67.6 kg)   04/25/20 150 lb (68 kg)   04/01/20 147 lb (66.7 kg)   02/13/20 145 lb 6.4 oz (66 kg)     BP Readings from Last 5 Encounters:   09/09/20 121/61   05/10/20 90/49   04/25/20 123/71   04/01/20 129/61   02/13/20 114/58       Hgb A1c - HPLC   Date  Value Ref Range Status   02/13/2020 8.6 (H) <5.7 % Final     Comment:     Confirmed by Repeat Analysis    For patients with diabetes, an A1c less than (<) or equal (=) to 7.0% is recommended for most patients, however the goal may be higher or lower depending on age and/or other medical problems.   For a diagnosis of diabetes, A1c greater than (>) or equal(=) to 6.5% indicates diabetes; values between 5.7% and 6.4% may indicate an increased risk of developing diabetes.   09/19/2019 7.4 (H) <5.7 % Final     Comment:     For patients with diabetes, an A1c less than (<) or equal (=) to 7.0% is recommended for most patients, however the goal may be higher or lower depending on age and/or other medical problems.   For a diagnosis of diabetes, A1c greater than (>) or equal(=) to 6.5% indicates diabetes; values between 5.7% and 6.4% may indicate an increased risk of developing diabetes.   09/19/2018 7.7 (H) <5.7 % Final     Comment:     For patients with diabetes, an A1c less than (<) or equal (=) to 7.0% is recommended for most patients, however the goal may be higher or lower depending on age and/or other medical problems.   For a diagnosis of diabetes, A1c greater than (>) or equal(=) to 6.5% indicates diabetes; values between 5.7% and 6.4% may indicate an increased risk of developing diabetes.     Hemoglobin A1C, Manual   Date Value Ref Range Status   05/10/2020 8.1 (Ref Range:4.2-6.5%) Note: For patients with diabetes, an A1c less than (<) or equal (=) to 7.0% is recommended for most patients, however the goal may be higher or lower depending on age and/or other medical problems. % Final   05/04/2019 7.5 (Ref Range:4.2-6.5%) Note: For patients with diabetes, an A1c less than (<) or equal (=) to 7.0% is recommended for most patients, however the goal may be higher or lower depending on age and/or other medical problems. % Final     Cholesterol   Date Value Ref Range Status   02/13/2020 192 See Comment mg/dL Final     Comment:     The significance of total cholesterol depends on the values of LDL, HDL, triglycerides and the clinical context. A patient-provider discussion may be considered.       09/19/2019 178 See Comment mg/dL Final     Comment:     The significance of total cholesterol depends on the values of LDL, HDL, triglycerides and the clinical context. A patient-provider discussion may be considered.       09/19/2018 184 See Comment mg/dL Final     Comment:     The significance of total cholesterol depends on the values of LDL, HDL, triglycerides and the clinical context. A patient-provider discussion may be considered.         Cholesterol,LDL,Calc   Date Value Ref Range Status   02/13/2020 52 <100 mg/dL Final     Comment:     If LDL value falls outside of the designated range AND if  included in any of the following categories, a  patient-provider discussion is recommended.     Statin therapy is recommended for individuals:  1. with clinical atherosclerotic cardiovascular disease     irrespective of LDL levels;  2. with LDL > or = 190 mg/dL;  3. with diabetes, aged 40-75 years, with LDL between 70 and  189 mg/dL;  4. without any of the above but who have LDL between 70 and     189 mg/dL and an estimated 45-WUJW risk of     atherosclerotic cardiovascular disease > or = 7.5%     (consider statin therapy if estimated 10-year risk > or =     5.0%) (ACC/AHA 2013 Guidelines).   09/19/2019 50 <100 mg/dL Final     Comment:     Patient is non-fasting, interpret with caution.  If LDL value falls outside of the designated range AND if  included in any of the following categories, a  patient-provider discussion is recommended.     Statin therapy is recommended for individuals:  1. with clinical atherosclerotic cardiovascular disease     irrespective of LDL levels;  2. with LDL > or = 190 mg/dL;  3. with diabetes, aged 40-75 years, with LDL between 70 and     189 mg/dL;  4. without any of the above but who have LDL between 70 and     189 mg/dL and an estimated 11-BJYN risk of     atherosclerotic cardiovascular disease > or = 7.5%     (consider statin therapy if estimated 10-year risk > or =     5.0%) (ACC/AHA 2013 Guidelines).   09/19/2018 66 <100 mg/dL Final     Comment:     If LDL value falls outside of the designated range AND if  included in any of the following categories, a  patient-provider discussion is recommended.     Statin therapy is recommended for individuals:  1. with clinical atherosclerotic cardiovascular disease     irrespective of LDL levels;  2. with LDL > or = 190 mg/dL;  3. with diabetes, aged 40-75 years, with LDL between 70 and     189 mg/dL;  4. without any of the above but who have LDL between 70 and     189 mg/dL and an estimated 82-NFAO risk of     atherosclerotic cardiovascular disease > or = 7.5%     (consider statin therapy if estimated 10-year risk > or =     5.0%) (ACC/AHA 2013 Guidelines).     Cholesterol, HDL   Date Value Ref Range Status   02/13/2020 127 >40 mg/dL Final     Comment:     If HDL cholesterol level falls outside of the designated  range, a patient-provider discussion is recommended   09/19/2019 115 >40 mg/dL Final     Comment:     If HDL cholesterol level falls outside of the designated  range, a patient-provider discussion is recommended   09/19/2018 105 >40 mg/dL Final     Comment:     If HDL cholesterol level falls outside of the designated  range, a patient-provider discussion is recommended     Triglycerides   Date Value Ref Range Status   02/13/2020 67 <150 mg/dL Final     Comment:     If Triglyceride level falls outside of the designated range,  a patient-provider discussion is recommended.     09/19/2019 63 <150 mg/dL Final     Comment:     Patient is non-fasting, interpret with caution.  If Triglyceride level falls outside of the designated range,  a patient-provider discussion is recommended.     09/19/2018 66 <150 mg/dL Final     Comment:     If Triglyceride level falls outside of the designated range,  a patient-provider discussion is recommended.  Creatinine   Date Value Ref Range Status   02/13/2020 0.94 0.60 - 1.30 mg/dL Final   24/40/1027 2.53 0.60 - 1.30 mg/dL Final   66/44/0347 4.25 0.60 - 1.30 mg/dL Final        PHQ-9 Results  Depression Screening (Patient Health Questionnaire PHQ) 12/24/2016 01/22/2017 01/27/2018 09/19/2019   PHQ-2: Feeling down, depressed, or hopeless No No No No   PHQ-2: Little interest or pleassure in doing things No No No No   Some recent data might be hidden       GAD-7 Results  No flowsheet data found.    DAST Results  No flowsheet data found.    Audit-C results  No flowsheet data found.      No follow-ups on file.  The above plan of care, diagnosis, orders, and follow-up were discussed with the patient.  Questions related to this recommended plan of care were answered.  Lonzo Cloud. Karolee Ohs, DO  09/09/2020    Author:  Lonzo Cloud. Benisha Hadaway 09/09/2020 3:28 PM

## 2020-09-10 ENCOUNTER — Ambulatory Visit: Payer: BLUE CROSS/BLUE SHIELD

## 2020-09-10 DIAGNOSIS — E1065 Type 1 diabetes mellitus with hyperglycemia: Secondary | ICD-10-CM

## 2020-09-10 DIAGNOSIS — I10 Essential (primary) hypertension: Secondary | ICD-10-CM

## 2020-09-10 DIAGNOSIS — E785 Hyperlipidemia, unspecified: Secondary | ICD-10-CM

## 2020-09-10 LAB — Differential Automated: IMMATURE GRANULOCYTES%: 0.2 (ref 0.00–0.50)

## 2020-09-10 LAB — UA,Microscopic: RBCS: 1 {cells}/uL (ref 0–11)

## 2020-09-10 LAB — Comprehensive Metabolic Panel: POTASSIUM: 5.1 mmol/L (ref 3.6–5.3)

## 2020-09-10 LAB — Magnesium: MAGNESIUM: 1.3 meq/L — ABNORMAL LOW (ref 1.4–1.9)

## 2020-09-10 LAB — TSH with reflex FT4, FT3: TSH: 0.4 u[IU]/mL (ref 0.3–4.7)

## 2020-09-10 LAB — Vitamin D,25-Hydroxy: VITAMIN D,25-HYDROXY: 79 ng/mL — ABNORMAL HIGH (ref 20–50)

## 2020-09-10 LAB — Vitamin B12: VITAMIN B12: >4000 pg/mL — ABNORMAL HIGH (ref 254–1060)

## 2020-09-10 LAB — Lipid Panel: NON-HDL,CHOLESTEROL,CALC: 63 mg/dL (ref >40–<130)

## 2020-09-10 LAB — PSA,Free & Total Profile: PSA,FREE: 0.28 ng/mL (ref 0–6.5)

## 2020-09-10 LAB — UA,Dipstick: GLUCOSE: NEGATIVE (ref 5.0–8.0)

## 2020-09-10 LAB — Uric Acid: URIC ACID: 7 mg/dL (ref 3.4–8.8)

## 2020-09-10 LAB — Folate,Serum: FOLATE,SERUM: 10.3 ng/mL (ref 8.1–30.4)

## 2020-09-10 LAB — Hgb A1c: HGB A1C - HPLC: 7.9 — ABNORMAL HIGH (ref <5.7)

## 2020-09-10 LAB — CBC: HEMATOCRIT: 36.2 — ABNORMAL LOW (ref 38.5–52.0)

## 2020-09-10 LAB — PTH, Intact: PTH, INTACT: 31 pg/mL (ref 11–51)

## 2020-09-10 LAB — Albumin/Creatinine Ratio,Urine: ALBUMIN/CREATININE RATIO: <12 mg/L

## 2020-09-10 MED ORDER — NOVOLOG FLEXPEN 100 UNIT/ML SC SOPN
2-8 [IU] | Freq: Three times a day (TID) | SUBCUTANEOUS | 9 refills | Status: AC
Start: 2020-09-10 — End: ?

## 2020-09-10 MED ORDER — BD AUTOSHIELD DUO 30G X 5 MM MISC
9 refills | Status: AC
Start: 2020-09-10 — End: ?

## 2020-09-10 MED ORDER — LANTUS SOLOSTAR 100 UNIT/ML SC SOPN
7 refills | Status: AC
Start: 2020-09-10 — End: ?

## 2020-09-10 NOTE — Nursing Note
Taught patient how to administer Novolog. Patient demonstrated understanding through teach back method. Instructed pt to keep unused medication in fridge until ready for use medication is good for 28 days at room temperature. Instructed pt to obtain red hazard bin to dispose of needles. Gave patient administration hand out as well as clean LA (needle disposal).

## 2020-09-10 NOTE — Progress Notes
PATIENT: Chad Watkins  MRN: [id number]  DOB: 1945/12/13  DATE OF SERVICE: 09/10/2020    REFERRING PRACTITIONER: Lubertha Sayres., MD   PRIMARY CARE PROVIDER: Trena Platt., DO    Reason for Consultation: Type 1 Diabetes Mellitus    Subjective:     History of Present Illness  Chad Watkins is a 75 y.o. male with type 1 diabetes mellitus. The patient presents for follow-up to the [facility].       Type 1 diabetes was diagnosed in 1989, and treated with insulin then.     He denies hx of DKA, but has had ''comas'' attributed to hypoglycemia that required paramedic visits, twice in July 2019.      Interval events:  At the last visit patient was instructed to:  '' I noted normoglycemia in the morning. Sometimes having elevated fasting sugars due to PP hyperglycemia after dinner.  Having variable PP blood sugars due to carb and insulin mismatch. Based on CGM interpetration, I recommend to continue to use appropriate corrective mealtime insulin scale. Discussed that given his longstanding high blood sugars, his body may need time to adjust to normal blood sugars and not feel symptoms of hypoglycemia.  Will continue same regimen.''    Here for follow-up appointment.   Having balance and gait issues.   +blurry vision. Difficulty seeing.   His other daughter helps with insulin injections.   Drinking alcohol at night time.     Current DM Medications  Continuar Lantus 7 units en la manana    Para desayuno    Blood glucose   Humalog Insulin  101-200    4  Units  201-300    5 units  >301     6 units     Para Lonche y Cena    Blood glucose   Humalog Insulin  101-200    3  Units  201-300    4 units  >301     5 units     Glucose Review:  Data from CGM:      [employer] Personal CGM Physician Interpretation Report  Dates Reviewed   (> 72 hours):  08/28/2020-09/10/2020   Avg Glucose:   194   % Serious Low (<54 mg/dL):  0   % Low (<47 mg/dL):  0.7   % In Target (82-956 mg/dL):  21.3   % High (086-578 mg/dL): 46.9   % Serious High (> 250 mg/dL):  62.9   Glucose variability:   30.7      In examining the patterns and providing interpretation, I noted 1) mostly normal blood sugars in the morning. 2) PP hyperglycemia with meals due to timing of insulin or insufficient insulin with meals 3) having hyperglycemia after dinner due to alcohol and then borderline low blood sugars in the morning. Based on CGM interpetration, I recommend 1) Reducing basal insulin 2) stop alcohol 3) advised to inject mealtime insulin before meals to prevent PP hyperglycemia 4) encouraged to moderate carbohydrates.     Exercise:   Walks. On his feet 8 hrs at work all day.      Complications & Associated Co-morbidities Yes No Comments/Treatment to Date   Retinopathy  []    [x]      Nephropathy  []    [x]      Neuropathy  []    [x]      Gastroparesis  []   [x]     CAD  []    [x]      Stroke  []    [  x]     PVD  []   [x]     Sleep Apnea  [x]    []   Not sure. Poor sleep?       Past Medical History  Past Medical History:   Diagnosis Date   ? Aortic atherosclerosis (HCC/RAF) 03/13/2020   ? Colon polyps-2020 09/19/2019   ? Diabetic neuropathy (HCC/RAF) 01/27/2018   ? Diabetic peripheral vascular disease (HCC/RAF) 09/03/2017   ? DM type 1 with diabetic mixed hyperlipidemia (HCC/RAF) 09/03/2017   ? Edema leg 07/14/2018   ? Glaucoma     Suspect   ? History of fracture of left ankle 12/01/2017   ? Hypertension associated with diabetes (HCC/RAF) 09/03/2017   ? Long term (current) use of insulin (HCC/RAF) 09/19/2019   ? Peripheral vascular disease (HCC/RAF) 09/03/2017   ? Tortuous aorta (HCC/RAF) 03/13/2020   ? Trigger thumb 01/27/2018   ? Type 1 diabetes mellitus with diabetic neuropathy, unspecified (HCC/RAF) 09/03/2017       Past Surgical History  Past Surgical History:   Procedure Laterality Date   ? APPENDECTOMY         Medications  Current Outpatient Medications   Medication Sig   ? acetaminophen-codeine (TYLENOL #3) 300-30 mg tablet TAKE 1 TABLET EVERY 4 HOURS AS NEEDED FOR PAIN (Patient not taking: No sig reported)   ? acetaminophen-codeine (TYLENOL #3) 300-30 mg tablet Take 1-2 tablets by mouth every six (6) hours as needed. Max Daily Amount: 8 tablets (Patient not taking: No sig reported)   ? AMLODIPINE-BENAZEPRIL 5-20 mg capsule TAKE 1 CAPSULE BY MOUTH TWO (2) TIMES DAILY.   ? cephalexin 500 mg capsule Take 1 capsule (500 mg total) by mouth four (4) times daily.   ? ciclopirox 8% solution Apply topically at bedtime Apply over nail and surrounding skin..   ? FLUZONE HIGH-DOSE 0.5 ML syringe  (Patient not taking: Reported on 09/09/2020.)   ? FUROSEMIDE 20 mg tablet TAKE 1 TABLET BY MOUTH EVERY DAY IN THE MORNING   ? gatifloxacin 0.5% ophthalmic solution  (Patient not taking: No sig reported)   ? glucagon (BAQSIMI TWO PACK) 3 mg/dose nasal powder 1 actuation (3 mg total) by Left Nare route every fifteen (15) minutes as needed.   ? glucose blood test strip Use as instructed   ? ibuprofen 600 mg tablet Take 1 tablet (600 mg total) by mouth every six (6) hours as needed.   ? insulin glargine (LANTUS) 100 units/mL injection vial USE 5 UNITS EVERY MORNING AND 3 UNITS EVERY EVENING.   ? insulin lispro, HumaLOG, (HUMALOG) 100 unit/mL injection vial 3 units sq qac plus insulin sliding scale patient has.  Hold for sugar <100.   ? insulin lispro, HumaLOG, (HUMALOG) 100 unit/mL injection vial 3 UNITS EACH MEAL PLUS AMOUNT DETERMINED BY MD'S SLIDING SCALE; HOLD FOR SUGAR UNDER 100.   ? insulin syringe needle U-100 (BD INSULIN SYRINGE U/F) 31G X 5/16'' 0.3 mL syringe UP TO 7 TIMES A DAY..   ? insulin syringe needle U-100 (BD INSULIN SYRINGE U/F) 31G X 5/16'' 0.3 mL syringe Up to 7 times a day.   ? LOTEMAX SM 0.38 % GEL  (Patient not taking: No sig reported)   ? metFORMIN (GLUCOPHAGE XR) 500 mg PO ER 24 hr tablet TAKE 2 TABLETS (1,000 MG TOTAL) BY MOUTH TWO (2) TIMES DAILY WITH MEALS.   ? ONETOUCH ULTRA test strip USE 1 TEST STRIP TESTING 5 TIMES A DAY.   ? PROLENSA 0.07 % SOLN  (Patient not taking: No  sig reported)   ? SHINGRIX 50 MCG/0.5ML injection  (Patient not taking: Reported on 09/09/2020.)   ? SILDENAFIL 100 mg tablet TOME UNA TABLETA TODOS LOS DIAS CUANDO SEA NECESARIO FOR ERECTILE DYSFUNCTION   ? simvastatin (ZOCOR) 20 mg tablet TOME UNA TABLETA DIARIAMENTE   ? SIMVASTATIN 20 mg tablet TOME UNA TABLETA TODOS LOS DIAS AL ACOSTARSE     No current facility-administered medications for this visit.     Allergies  Patient has no known allergies.    Family History  Family History   Problem Relation Age of Onset   ? Glaucoma Neg Hx    ? Macular degeneration Neg Hx        Social History  Social History     Socioeconomic History   ? Marital status: Married   ? Number of children: 4   Occupational History   ? Occupation: Soil scientist    Tobacco Use   ? Smoking status: Never Smoker   ? Smokeless tobacco: Never Used   Substance and Sexual Activity   ? Alcohol use: Yes     Alcohol/week: 4.2 oz     Types: 7 Standard drinks or equivalent per week       Review of Systems:  A 14 point Review of Systems conducted was not significant other than as discussed in HPI above.     Objective:      Vitals: There were no vitals taken for this visit.   Wt Readings from Last 3 Encounters:   09/09/20 143 lb 6.4 oz (65 kg)   05/10/20 149 lb (67.6 kg)   04/25/20 150 lb (68 kg)     Constitutional: alert and oriented, no acute distress.  Eye: no conjunctival injection. no proptosis, lid lag, or stare.  HEENT: Ears are normal without deformaties. MMM  ENMT: Trachea is midline. Thyroid appears normal in size.    Pulm: Respirations are non-labored.   MSK: Normal gait.    Integumentary: Skin intact. No rashes.   Neuro: No focal deficits noted. no hand tremor.   Psych: Cooperative. Appropriate mood and affect.     Lab Review:  Lab Results   Component Value Date    HGBA1C 7.9 (H) 09/09/2020    HGBA1C 8.1 05/10/2020    HGBA1C 8.6 (H) 02/13/2020      Lab Results   Component Value Date    GLUCOSE 89 09/09/2020    ALBCREATUR  09/09/2020 Comment:      Unable to calculate. Test result is?below detection?limit.    CHOLDLQ 80 01/27/2018    CHOLDLCAL 50 09/09/2020    CPEPTIDE <0.2 (L) 09/03/2017      Lab Results   Component Value Date    CREAT 0.94 09/09/2020    BUN 18 09/09/2020    NA 138 09/09/2020    K 5.1 09/09/2020    CL 96 09/09/2020    CO2 24 09/09/2020      Lab Results   Component Value Date    WBC 5.53 09/09/2020    HGB 12.1 (L) 09/09/2020    HCT 36.2 (L) 09/09/2020    MCV 92.6 09/09/2020    PLT 215 09/09/2020      No results found for: OSMOLALITY   Lab Results   Component Value Date    KETONESUR Negative 09/09/2020      No results found for: PHVEN, PHART   Lab Results   Component Value Date    CHOL 192 09/09/2020    CHOLHDL 129 09/09/2020  CHOLDLQ 80 01/27/2018    CHOLDLCAL 50 09/09/2020    TRIGLY 63 09/09/2020        Assessment/Plan:    75 y.o. male presenting to the [facility] with the following issues:    1. Type 1 Diabetes Mellitus  Lab Results   Component Value Date    HGBA1C 7.9 (H) 09/09/2020    HGBA1C 8.1 05/10/2020    HGBA1C 8.6 (H) 02/13/2020     Uncontrolled T1DM.  No known DM complications. Complicated by severe hypoglycemia.    A1C goal < 7.5%: to reduce risk of diabetes related complications to the eyes, feet and kidneys with additional goal of hypoglycemia risk reduction.  On CGM,  noted 1) mostly normal blood sugars in the morning. 2) PP hyperglycemia with meals due to timing of insulin or insufficient insulin with meals 3) having hyperglycemia after dinner due to alcohol and then borderline low blood sugars in the morning. Based on CGM interpetration, I recommend 1) Reducing basal insulin 2) stop alcohol 3) advised to inject mealtime insulin before meals to prevent PP hyperglycemia 4) encouraged to moderate carbohydrates.     Also, recommended that he consider an automated insulin pump given hyperglycemia and hypoglycemia. Patient and daughter will consider this.    -Please see new DM regimen below in patient instructions   -High frequency glucose monitoring with CGM   -Hypoglycemia protocol reviewed. ''Rule of 15'' handout given also. Carry glucose tabs at all times.   -Check glucose prior to driving. Glucose must be > 100 mg/dl. Reviewed risks related to driving with hypoglycemia   -Glucagon reviewed.  Baqsimi (glucagon) nasal spray if possible.  Ordered.  Waiting for coupon.     2. Hypertension   -Continue current regimen, including ACE-I inhibitor or ARB therapy    3. Hyperlipidemia   Per AHA guidelines, statin therapy is recommended with history of diabetes mellitus   -Continue statin therapy.    4. Diabetic microvascular disease screening:  Health Maintenance   Topic Date Due   ? Diabetes: Hemoglobin A1c Blood Test  03/12/2021   ? Diabetes: Dilated Eye Exam  07/25/2021   ? Diabetes: Kidney Monitoring  09/09/2021     5. ETOH use.  Hypoglycemia precautions discussed with patient.     6. Sleep Disorder.  Dtr suspects OSA.  See PMD re: eval.    Orders Placed This Encounter   ? POCT glucose   ? insulin aspart, NovoLOG, (NOVOLOG FLEXPEN) 100 units/mL injection pen   ? Insulin Pen Needle (BD AUTOSHIELD DUO) 30G X 5 MM MISC   ? insulin GLARGINE, Lantus Solostar, (LANTUS SOLOSTAR) 100 units/mL injection pen     I spent 40 minutes counseling and coordinating care for the items checked below on the day of service  [x]  Preparing to see the patient (e.g., review of tests)  [x]  Obtaining and or reviewing separately obtained history   [x]  Performing a medically appropriate examination and/or evaluation   [x]  Counseling and educating the patient/family/caregiver   [x]  Ordering medications, tests, or procedures  []  Referring and communicating with other healthcare professionals (when not separately reported)  [x]  Documenting clinical information in the EHR  [x]  Independently interpreting results and communicating results to patient/ family/caregiver    Author: Erie Noe A. Noreene Filbert, MD 09/10/2020 10:38 AM     Patient Instructions   1) Reducir Lantus 6 en la manana  2) Si vas a tomar alcohol, necesitas bajar tu Humalog insulina por 50% O 2 unidades  3) Si la azucar la tienes >200 mg/dl antes de comer, ponte la insulina y despues comer a los 30 minutos.      Para desayuno    Blood glucose   Humalog Insulin  101-200    4  Units  201-300    5 units  >301     6 units     Para Lonche y Cena    Blood glucose   Humalog Insulin  101-200    3  Units  201-300    4 units  >301     5 units

## 2020-09-10 NOTE — Patient Instructions
1) Reducir Lantus 6 en la manana  2) Si vas a tomar alcohol, necesitas bajar tu Humalog insulina por 50% O 2 unidades  3) Si la azucar la tienes >200 mg/dl antes de comer, ponte la insulina y despues comer a los 30 minutos.      Para desayuno    Blood glucose   Humalog Insulin  101-200    4  Units  201-300    5 units  >301     6 units     Para Lonche y Cena    Blood glucose   Humalog Insulin  101-200    3  Units  201-300    4 units  >301     5 units

## 2020-09-12 LAB — Testosterone, Bioavailable and Total, Includes Sex Hormone-Binding Globulin (Adult Males or Individuals on Testosterone Hormone Therapy): TESTOSTERONE, PERCENTAGE FREE: 1 — ABNORMAL LOW (ref 1.6–2.9)

## 2020-09-23 ENCOUNTER — Ambulatory Visit: Payer: BLUE CROSS/BLUE SHIELD

## 2020-09-24 ENCOUNTER — Ambulatory Visit: Payer: BLUE CROSS/BLUE SHIELD

## 2020-09-24 MED ORDER — FUROSEMIDE 20 MG PO TABS
ORAL_TABLET | 0 refills | Status: AC
Start: 2020-09-24 — End: ?

## 2020-10-11 ENCOUNTER — Telehealth: Payer: BLUE CROSS/BLUE SHIELD

## 2020-10-14 MED ADMIN — PROPARACAINE HCL 0.5 % OP SOLN: 1 [drp] | OPHTHALMIC | @ 22:00:00 | Stop: 2020-10-14 | NDC 24208073006

## 2020-10-14 MED ADMIN — PREDNISOLONE ACETATE 1 % OP SUSP: 1 [drp] | OPHTHALMIC | @ 22:00:00 | Stop: 2020-10-14 | NDC 61314063705

## 2020-10-14 MED ADMIN — TROPICAMIDE 1 % OP SOLN: 1 [drp] | OPHTHALMIC | @ 19:00:00 | Stop: 2020-10-14 | NDC 61314035501

## 2020-10-15 NOTE — Op Note
DATE OF OPERATION:  10/14/2020      PREOPERATIVE DIAGNOSIS:  Posterior capsular opacity, left eye, obscuring vision.    POSTOPERATIVE DIAGNOSIS:  Posterior capsular opacity, left eye, obscuring vision.    NAME OF OPERATION:  YAG laser capsulotomy, left eye      SURGEON:  Lenice Pressman, MD [phone])      ANESTHESIA:  Topical.    INDICATIONS:  Chad Watkins is a 75 year old male who presented to the office with decreased vision and a posterior capsular opacity of the left eye obscuring vision. Risks and benefits were reviewed. He wished to proceed with laser capsulotomy. Consent was placed on the chart.    DESCRIPTION OF PROCEDURE:  The patient was taken to the laser suite at [facility]. One drop of 1% Mydriacyl was used to dilate the pupil. Proparacaine drops were used for topical anesthesia. The patient was seated at the slit lamp of the laser. An Abraham capsulotomy lens was placed with Goniosol on the left cornea. The posterior capsule was brought into focus. A setting of 2.0 mJ was used. A total of 34 spots were applied. This resulted in a nice central opening in the posterior capsular opacity. The patient tolerated the procedure well. He was instructed to use prednisolone eye drops 4 times a day for 4 days and to follow up at the office for a postop check    COMPLICATIONS:  None.    SPECIMENS:  None.    Lenice Pressman, MD [phone])      BJ/MODL CONF#: [id number]  D: 10/14/2020 16:48:39 T: 10/14/2020 20:31:01 DOCUMENT: [id number]

## 2020-10-22 ENCOUNTER — Ambulatory Visit: Payer: BLUE CROSS/BLUE SHIELD

## 2020-10-22 DIAGNOSIS — H26493 Other secondary cataract, bilateral: Secondary | ICD-10-CM

## 2020-10-22 NOTE — Progress Notes
Assessment and Plan     Problem List        Eye / Vision Problems    1. Posterior capsular opacification     Overview      Doing well s/p yag capsulotomy left eye on 10/14/2020    Risks/benefits/questions reviewed with translation and in presence of   daughter  Wishes to proceed with yag capsulotomy right eye     Schedule yag capsulotomy right eye             Posterior capsular opacification              Orders:  No orders of the defined types were placed in this encounter.      Follow ups:  No follow-ups on file.         I discussed the above assessment and plan with the patient. He had the opportunity to ask questions, and his questions and concerns were addressed. He was reminded to call if there is any significant change or worsening in vision, or to get an evaluation, urgently if appropriate.    Author:   Lenice Pressman, MD  This note details the assessment and plan of the encounter for this date. For a complete note and record of the encounter, please see the Encounter Summary.

## 2020-10-30 ENCOUNTER — Ambulatory Visit: Payer: BLUE CROSS/BLUE SHIELD

## 2020-11-04 MED ORDER — SILDENAFIL CITRATE 100 MG PO TABS
ORAL_TABLET | 10 refills
Start: 2020-11-04 — End: ?

## 2020-11-06 MED ORDER — SILDENAFIL CITRATE 100 MG PO TABS
ORAL_TABLET | 10 refills | Status: AC
Start: 2020-11-06 — End: ?

## 2020-11-08 ENCOUNTER — Telehealth: Payer: BLUE CROSS/BLUE SHIELD

## 2020-11-11 MED ADMIN — PREDNISOLONE ACETATE 1 % OP SUSP: 1 [drp] | OPHTHALMIC | @ 22:00:00 | Stop: 2020-11-11 | NDC 61314063705

## 2020-11-11 MED ADMIN — BALANCED SALT IO SOLN: @ 22:00:00 | Stop: 2020-11-12

## 2020-11-11 MED ADMIN — HYPROMELLOSE 0.3 % OP GEL: @ 22:00:00 | Stop: 2020-11-12 | NDC 00065806401

## 2020-11-11 MED ADMIN — TROPICAMIDE 1 % OP SOLN: 1 [drp] | OPHTHALMIC | @ 20:00:00 | Stop: 2020-11-11 | NDC 70069012101

## 2020-11-11 MED ADMIN — PROPARACAINE HCL 0.5 % OP SOLN: 1 [drp] | OPHTHALMIC | @ 22:00:00 | Stop: 2020-11-11 | NDC 24208073006

## 2020-11-12 NOTE — Op Note
DATE OF OPERATION:  11/11/2020      PREOPERATIVE DIAGNOSIS:  Posterior capsular opacity, right eye, obscuring vision.    POSTOPERATIVE DIAGNOSIS:  Posterior capsular opacity, right eye, obscuring vision.    NAME OF OPERATION:  YAG laser capsulotomy, right eye.      SURGEON:  Lenice Pressman, MD [phone])      ANESTHESIA:  Topical.    INDICATIONS:  Chad Watkins is a 75 year old male who presented to the office with decreased vision and a thickened posterior capsule of the right eye obscuring vision. Risks and benefits were reviewed. He wished to proceed with laser capsulotomy. Consent was placed on the chart.    DESCRIPTION OF PROCEDURE:  The patient was taken to the laser suite at [facility]. One drop of 1% Mydriacyl was used to dilate the right pupil. Proparacaine eye drops were used for topical anesthesia. The patient was seated at the slit lamp of the laser. An Abraham capsulotomy lens was placed with Goniosol on the right cornea. The posterior capsule was brought into focus. A setting of 2.2 mJ was used. A total of 34 spots were applied. This resulted in a nice central opening in the posterior capsular opacity. The patient tolerated the procedure well. He was instructed to use prednisolone eye drops 4 times a day for 4 days and to follow up at the office for a postop check.    COMPLICATIONS:  None.    SPECIMENS:  None.    Lenice Pressman, MD [phone])      BJ/MODL CONF#: [id number]  D: 11/11/2020 15:16:28 T: 11/12/2020 02:19:07 DOCUMENT: [id number]

## 2020-11-14 ENCOUNTER — Ambulatory Visit: Payer: BLUE CROSS/BLUE SHIELD

## 2020-11-14 DIAGNOSIS — L603 Nail dystrophy: Secondary | ICD-10-CM

## 2020-11-14 DIAGNOSIS — B351 Tinea unguium: Secondary | ICD-10-CM

## 2020-11-14 DIAGNOSIS — M79674 Pain in right toe(s): Secondary | ICD-10-CM

## 2020-11-14 DIAGNOSIS — M79675 Pain in left toe(s): Secondary | ICD-10-CM

## 2020-11-14 NOTE — Progress Notes
CHIEF COMPLAINT   Nails and unstable gait    HISTORY OF PRESENT ILLNESS   Chad Watkins is a 75 y.o. year old male who presents with worsening thickness and pain in the nails of his feet.  he states that the nails have been discolored and thickened for several years.  The pain is worse when ambulating in enclosed shoe gear.  he denies any active bleeding or drainage of the nails.    PAST MEDICAL HISTORY     Past Medical History:   Diagnosis Date   ? Aortic atherosclerosis (HCC/RAF) 03/13/2020   ? Colon polyps-2020 09/19/2019   ? Diabetic neuropathy (HCC/RAF) 01/27/2018   ? Diabetic peripheral vascular disease (HCC/RAF) 09/03/2017   ? DM type 1 with diabetic mixed hyperlipidemia (HCC/RAF) 09/03/2017   ? Edema leg 07/14/2018   ? Glaucoma     Suspect   ? History of fracture of left ankle 12/01/2017   ? Hypertension associated with diabetes (HCC/RAF) 09/03/2017   ? Long term (current) use of insulin (HCC/RAF) 09/19/2019   ? Peripheral vascular disease (HCC/RAF) 09/03/2017   ? Tortuous aorta (HCC/RAF) 03/13/2020   ? Trigger thumb 01/27/2018   ? Type 1 diabetes mellitus with diabetic neuropathy, unspecified (HCC/RAF) 09/03/2017       MEDICATIONS     Outpatient Medications Prior to Visit   Medication Sig   ? acetaminophen-codeine (TYLENOL #3) 300-30 mg tablet TAKE 1 TABLET EVERY 4 HOURS AS NEEDED FOR PAIN (Patient not taking: Reported on 09/10/2020.)   ? acetaminophen-codeine (TYLENOL #3) 300-30 mg tablet Take 1-2 tablets by mouth every six (6) hours as needed. Max Daily Amount: 8 tablets (Patient not taking: Reported on 09/10/2020.)   ? AMLODIPINE-BENAZEPRIL 5-20 mg capsule TAKE 1 CAPSULE BY MOUTH TWO (2) TIMES DAILY.   ? cephalexin 500 mg capsule Take 1 capsule (500 mg total) by mouth four (4) times daily.   ? ciclopirox 8% solution Apply topically at bedtime Apply over nail and surrounding skin.. (Patient not taking: Reported on 09/10/2020.)   ? FLUZONE HIGH-DOSE 0.5 ML syringe    ? FUROSEMIDE 20 mg tablet TAKE 1 TABLET BY MOUTH EVERY DAY IN THE MORNING   ? gatifloxacin 0.5% ophthalmic solution    ? glucagon (BAQSIMI TWO PACK) 3 mg/dose nasal powder 1 actuation (3 mg total) by Left Nare route every fifteen (15) minutes as needed.   ? glucose blood test strip Use as instructed   ? ibuprofen 600 mg tablet Take 1 tablet (600 mg total) by mouth every six (6) hours as needed.   ? insulin aspart, NovoLOG, (NOVOLOG FLEXPEN) 100 units/mL injection pen Inject 0.02-0.08 mLs (2-8 Units total) under the skin three (3) times daily before meals.   ? insulin GLARGINE, Lantus Solostar, (LANTUS SOLOSTAR) 100 units/mL injection pen Use 6 units daily. Waste 2 units to prime the needle..   ? Insulin Pen Needle (BD AUTOSHIELD DUO) 30G X 5 MM MISC Use to inject insulin up to 6x daily.   ? insulin syringe needle U-100 (BD INSULIN SYRINGE U/F) 31G X 5/16'' 0.3 mL syringe UP TO 7 TIMES A DAY..   ? insulin syringe needle U-100 (BD INSULIN SYRINGE U/F) 31G X 5/16'' 0.3 mL syringe Up to 7 times a day.   ? LOTEMAX SM 0.38 % GEL    ? metFORMIN (GLUCOPHAGE XR) 500 mg PO ER 24 hr tablet TAKE 2 TABLETS (1,000 MG TOTAL) BY MOUTH TWO (2) TIMES DAILY WITH MEALS.   ? ONETOUCH ULTRA test strip USE  1 TEST STRIP TESTING 5 TIMES A DAY.   ? PROLENSA 0.07 % SOLN    ? SHINGRIX 50 MCG/0.5ML injection    ? SILDENAFIL 100 mg tablet TOME UNA TABLETA TODOS LOS DIAS CUANDO SEA NECESARIO FOR ERECTILE DYSFUNCTION   ? simvastatin (ZOCOR) 20 mg tablet TOME UNA TABLETA DIARIAMENTE   ? SIMVASTATIN 20 mg tablet TOME UNA TABLETA TODOS LOS DIAS AL ACOSTARSE     No facility-administered medications prior to visit.       ALLERGIES   No Known Allergies    SURGICAL HISTORY     Past Surgical History:   Procedure Laterality Date   ? APPENDECTOMY         ROS   General ROS: negative    PHYSICAL EXAM   There were no vitals filed for this visit.  GEN: no acute distress  VASC:  Dorsalis Pedis:  present  Posterior Tibial:  present  There is brisk capillary refill time to all digits less than 3 seconds.   varicosities absent Bilateral  edema is absent Bilateral  ecchymosis is not observed  DERM:   Skin is warm to touch  Skin tone and turgor are adequate.  No abrasions or excoriations are noted  NAILS:   The following nails are thick and dystrophic, they harbor subungual debris and hyperkeratosis. They are tender with direct palpation of the nail plate.       Left      Right      1 2 3 4 5  1 2 3 4 5    Nails [x]  [x]  [x]  [x]  [x]   [x]  [x]  [x]  [x]  [x]       No active bleeding or drainage is noted.  No clinical signs of infection.  NEURO:  protective sensation is intact to the digits.  Vibratory sensation is diminished.    MSK:  Muscle strength 5/5 in all 4 quadrants.    Ankle ROM is normal, Subtalar joint ROM is normal.   There is tenderness to palpation of the nail plate.    ASSESSMENT/PLAN   Chad Watkins is a 75 y.o. male who presents with:  Onychomycosis with pain  ? Manual and mechanical debridement of all 10 toenails done in the office without complications.  Pt will continue to soak for any lingering toenail discomfort and f/u in the office as needed.   ? We discussed treatment for onychomycosis including topicals, lasers, nail avulsions and oral medication.  ? Discussed pros and cons of oral Lamisil treatment including all side effects and potentially severe liver complications  ? Pt elects deb of nails x 10  Diabetic shoes with accomodative inserts for support and balance rx given.  ? F/u prn       Dannah Ryles [name], DPM  11/14/2020

## 2020-11-19 ENCOUNTER — Ambulatory Visit: Payer: BLUE CROSS/BLUE SHIELD

## 2020-11-19 DIAGNOSIS — H26493 Other secondary cataract, bilateral: Secondary | ICD-10-CM

## 2020-11-19 NOTE — Progress Notes
Assessment and Plan     Problem List        Eye / Vision Problems    1. Posterior capsular opacification     Overview      Doing well s/p yag capsulotomy right eye on 11/11/2020  Doing well s/p yag capsulotomy left eye on 10/14/2020    F/u with Dr Luana Shu as scheduled            Posterior capsular opacification              Orders:  No orders of the defined types were placed in this encounter.      Follow ups:  Return for Dr Luana Shu next avail for refraction.         I discussed the above assessment and plan with the patient. He had the opportunity to ask questions, and his questions and concerns were addressed. He was reminded to call if there is any significant change or worsening in vision, or to get an evaluation, urgently if appropriate.    Author:   Lenice Pressman, MD  This note details the assessment and plan of the encounter for this date. For a complete note and record of the encounter, please see the Encounter Summary.

## 2020-11-29 MED ORDER — AMLODIPINE BESY-BENAZEPRIL HCL 5-20 MG PO CAPS
1 | ORAL_CAPSULE | Freq: Two times a day (BID) | ORAL | 0 refills
Start: 2020-11-29 — End: ?

## 2020-12-02 MED ORDER — AMLODIPINE BESY-BENAZEPRIL HCL 5-20 MG PO CAPS
1 | ORAL_CAPSULE | Freq: Two times a day (BID) | ORAL | 0 refills | Status: AC
Start: 2020-12-02 — End: ?

## 2020-12-06 MED ORDER — FUROSEMIDE 20 MG PO TABS
20 mg | ORAL_TABLET | Freq: Every day | ORAL | 0 refills | Status: AC
Start: 2020-12-06 — End: ?

## 2021-01-15 ENCOUNTER — Ambulatory Visit: Payer: BLUE CROSS/BLUE SHIELD

## 2021-01-21 ENCOUNTER — Ambulatory Visit: Payer: BLUE CROSS/BLUE SHIELD

## 2021-01-22 ENCOUNTER — Ambulatory Visit: Payer: BLUE CROSS/BLUE SHIELD

## 2021-01-22 DIAGNOSIS — E785 Hyperlipidemia, unspecified: Secondary | ICD-10-CM

## 2021-01-22 DIAGNOSIS — E1065 Type 1 diabetes mellitus with hyperglycemia: Secondary | ICD-10-CM

## 2021-01-22 DIAGNOSIS — I10 Essential (primary) hypertension: Secondary | ICD-10-CM

## 2021-01-22 MED ORDER — BD AUTOSHIELD DUO 30G X 5 MM MISC
9 refills | 28.00000 days | Status: AC
Start: 2021-01-22 — End: ?

## 2021-01-22 MED ORDER — LANTUS SOLOSTAR 100 UNIT/ML SC SOPN
7 refills | Status: AC
Start: 2021-01-22 — End: 2021-01-24

## 2021-01-22 MED ORDER — BASAGLAR KWIKPEN 100 UNIT/ML SC SOPN
SUBCUTANEOUS | 0 refills | 32.00000 days
Start: 2021-01-22 — End: ?

## 2021-01-22 MED ORDER — NOVOLOG FLEXPEN 100 UNIT/ML SC SOPN
2-8 [IU] | Freq: Three times a day (TID) | SUBCUTANEOUS | 9 refills | Status: AC
Start: 2021-01-22 — End: ?

## 2021-01-22 MED ORDER — BAQSIMI TWO PACK 3 MG/DOSE NA POWD
1 | NASAL | 1 refills | Status: AC | PRN
Start: 2021-01-22 — End: ?

## 2021-01-22 MED ORDER — METFORMIN HCL ER 500 MG PO TB24
1000 mg | ORAL_TABLET | Freq: Two times a day (BID) | ORAL | 3 refills | Status: AC
Start: 2021-01-22 — End: ?

## 2021-01-22 NOTE — Progress Notes
PATIENT: Chad Watkins  MRN: [id number]  DOB: 05/13/1945  DATE OF SERVICE: 01/22/2021    REFERRING PRACTITIONER: Trena Platt., DO   PRIMARY CARE PROVIDER: Trena Platt., DO    Reason for Consultation: Type 1 Diabetes Mellitus    Subjective:     History of Present Illness  Chad Watkins is a 76 y.o. male with type 1 diabetes mellitus. The patient presents for follow-up to the [facility].       Type 1 diabetes was diagnosed in 1989, and treated with insulin then.     He denies hx of DKA, but has had ''comas'' attributed to hypoglycemia that required paramedic visits, twice in July 2019.      Interval events:  Here for follow-up.   Reports that there was glitch in his insurance and getting his dexcom CGM supplies. Has been without CGM. Having more lows. At least 2x/week <70.    Usually happening at work when he is more active.     Patient is testing BG 4x daily when not using CGM.   Patient uses insulin 4x daily.     When patient has CGM device, patient uses it consistently.   Patient tests blood glucose to calibrate their CGM.   Patient continues to dose insulin >3x/day and frequently adjusts insulin dosage based on the CGM device information.      Current DM Medications  Continuar Lantus 7 units en la manana    Para desayuno    Blood glucose   Humalog Insulin  101-200    4  Units  201-300    5 units  >301     6 units     Para Lonche y Cena    Blood glucose   Humalog Insulin  101-200    3  Units  201-300    4 units  >301     5 units     Glucose Review:  Data from glucose meter:      FBS 68-248  Daytime 50-276    Exercise:   Walks. On his feet 8 hrs at work all day.      Complications & Associated Co-morbidities Yes No Comments/Treatment to Date   Retinopathy  []    [x]      Nephropathy  []    [x]   Lab Results   Component Value Date    CREAT 0.94 09/09/2020    ALBCREATUR  09/09/2020      Comment:      Unable to calculate. Test result is?below detection?limit.       Neuropathy  []    [x]  Gastroparesis  []   [x]     CAD  []    [x]      Stroke  []    [x]      PVD  []   [x]     Sleep Apnea  [x]    []   Not sure. Poor sleep?       Past Medical History  Past Medical History:   Diagnosis Date   ? Aortic atherosclerosis (HCC/RAF) 03/13/2020   ? Colon polyps-2020 09/19/2019   ? Diabetic neuropathy (HCC/RAF) 01/27/2018   ? Diabetic peripheral vascular disease (HCC/RAF) 09/03/2017   ? DM type 1 with diabetic mixed hyperlipidemia (HCC/RAF) 09/03/2017   ? Edema leg 07/14/2018   ? Glaucoma     Suspect   ? History of fracture of left ankle 12/01/2017   ? Hypertension associated with diabetes (HCC/RAF) 09/03/2017   ? Long term (current) use of insulin (HCC/RAF) 09/19/2019   ?  Peripheral vascular disease (HCC/RAF) 09/03/2017   ? Tortuous aorta (HCC/RAF) 03/13/2020   ? Trigger thumb 01/27/2018   ? Type 1 diabetes mellitus with diabetic neuropathy, unspecified (HCC/RAF) 09/03/2017       Past Surgical History  Past Surgical History:   Procedure Laterality Date   ? APPENDECTOMY     ? YAG CAPSULOTOMY Left 10/14/2020   ? YAG CAPSULOTOMY Right 11/11/2020       Medications  Current Outpatient Medications   Medication Sig   ? acetaminophen-codeine (TYLENOL #3) 300-30 mg tablet    ? acetaminophen-codeine (TYLENOL #3) 300-30 mg tablet Take 1-2 tablets by mouth every six (6) hours as needed. Max Daily Amount: 8 tablets   ? AMLODIPINE-BENAZEPRIL 5-20 mg capsule TAKE 1 CAPSULE BY MOUTH TWO (2) TIMES DAILY.   ? cephalexin 500 mg capsule Take 1 capsule (500 mg total) by mouth four (4) times daily.   ? ciclopirox 8% solution Apply topically at bedtime Apply over nail and surrounding skin..   ? FLUZONE HIGH-DOSE 0.5 ML syringe    ? furosemide 20 mg tablet Take 1 tablet (20 mg total) by mouth daily TAKE 1 TABLET BY MOUTH EVERY DAY IN THE MORNING Strength: 20 mg.   ? gatifloxacin 0.5% ophthalmic solution    ? glucagon (BAQSIMI TWO PACK) 3 mg/dose nasal powder 1 actuation (3 mg total) by Left Nare route every fifteen (15) minutes as needed. ? glucose blood test strip Use as instructed   ? ibuprofen 600 mg tablet Take 1 tablet (600 mg total) by mouth every six (6) hours as needed.   ? insulin aspart, NovoLOG, (NOVOLOG FLEXPEN) 100 units/mL injection pen Inject 0.02-0.08 mLs (2-8 Units total) under the skin three (3) times daily before meals.   ? insulin GLARGINE, Lantus Solostar, (LANTUS SOLOSTAR) 100 units/mL injection pen Use 6 units daily. Waste 2 units to prime the needle..   ? Insulin Pen Needle (BD AUTOSHIELD DUO) 30G X 5 MM MISC Use to inject insulin up to 6x daily.   ? insulin syringe needle U-100 (BD INSULIN SYRINGE U/F) 31G X 5/16'' 0.3 mL syringe UP TO 7 TIMES A DAY..   ? insulin syringe needle U-100 (BD INSULIN SYRINGE U/F) 31G X 5/16'' 0.3 mL syringe Up to 7 times a day.   ? LOTEMAX SM 0.38 % GEL    ? metFORMIN (GLUCOPHAGE XR) 500 mg PO ER 24 hr tablet TAKE 2 TABLETS (1,000 MG TOTAL) BY MOUTH TWO (2) TIMES DAILY WITH MEALS.   ? ONETOUCH ULTRA test strip USE 1 TEST STRIP TESTING 5 TIMES A DAY.   ? PROLENSA 0.07 % SOLN    ? SHINGRIX 50 MCG/0.5ML injection    ? SILDENAFIL 100 mg tablet TOME UNA TABLETA TODOS LOS DIAS CUANDO SEA NECESARIO FOR ERECTILE DYSFUNCTION   ? simvastatin (ZOCOR) 20 mg tablet TOME UNA TABLETA DIARIAMENTE   ? SIMVASTATIN 20 mg tablet TOME UNA TABLETA TODOS LOS DIAS AL ACOSTARSE     No current facility-administered medications for this visit.     Allergies  Patient has no known allergies.    Family History  Family History   Problem Relation Age of Onset   ? Glaucoma Neg Hx    ? Macular degeneration Neg Hx        Social History  Social History     Socioeconomic History   ? Marital status: Married   ? Number of children: 4   Occupational History   ? Occupation: Soil scientist    Tobacco  Use   ? Smoking status: Never   ? Smokeless tobacco: Never   Substance and Sexual Activity   ? Alcohol use: Yes     Alcohol/week: 4.2 oz     Types: 7 Standard drinks or equivalent per week       Review of Systems:  A 14 point Review of Systems conducted was not significant other than as discussed in HPI above.     Objective:      Vitals: BP 134/70  ~ Pulse 82  ~ Ht 5' 4'' (1.626 m)  ~ Wt 152 lb 12.8 oz (69.3 kg)  ~ BMI 26.23 kg/m?    Wt Readings from Last 3 Encounters:   01/22/21 152 lb 12.8 oz (69.3 kg)   09/10/20 144 lb (65.3 kg)   09/09/20 143 lb 6.4 oz (65 kg)            Additional Findings   General  [x]  NAD  [x]  Well nourished      Eyes  [x]  Moist conjunctivae   [x]  Anicteric sclera []  PERRL  [x]  No lid lag    HENT  [x]  Normocephalic  [x]  Atraumatic   []  oropharynx clear with moist mucous membranes  []  no caries    Neck  []  Neck supple    []  No JVD  []  No cervical fat pad     Thyroid  []  Normal size  []  No masses/nodules  []  Non-tender  []  No bruit    Lymph  []  No cervical LAD   []  No axillary LAD      Respiratory  []  CTABL  []  No wheezes  []  No crackles []  Normal effort    CV  []  RRR  []  Normal S1/S2  []  No m/r/g       [x]  Pedal pulses 2+  []  No carotid bruit  [x]  No LE edema     Abdomen:  []  Soft  []  Non-tender  []  No organomegaly  []  Normal BS    MSK  []  Normal gait  []  Normal ROM   []  Normal muscle tone  []  No joint tenderness    Neuro  [x]  Normal       monofilament  []  Normal vibratory sense  [x]  Normal strength  []  DTR 2+    Derm  [x]  No foot ulcers  [x]  No rashes   []  No lipohypertrophy   +onychomycosis in toenailes  +Liphypertrophy in midabdomen BL   Psych  [x]  Appropriate affect  [x]  Alert and oriented x3          Lab Review:  Lab Results   Component Value Date    HGBA1C 7.9 (H) 09/09/2020    HGBA1C 8.1 05/10/2020    HGBA1C 8.6 (H) 02/13/2020      Lab Results   Component Value Date    GLUCOSE 89 09/09/2020    ALBCREATUR  09/09/2020      Comment:      Unable to calculate. Test result is?below detection?limit.    CHOLDLQ 80 01/27/2018    CHOLDLCAL 50 09/09/2020    CPEPTIDE <0.2 (L) 09/03/2017      Lab Results   Component Value Date    CREAT 0.94 09/09/2020    BUN 18 09/09/2020    NA 138 09/09/2020    K 5.1 09/09/2020    CL 96 09/09/2020    CO2 24 09/09/2020      Lab Results   Component Value Date    WBC 5.53 09/09/2020  HGB 12.1 (L) 09/09/2020    HCT 36.2 (L) 09/09/2020    MCV 92.6 09/09/2020    PLT 215 09/09/2020      No results found for: OSMOLALITY   Lab Results   Component Value Date    KETONESUR Negative 09/09/2020      No results found for: PHVEN, PHART   Lab Results   Component Value Date    CHOL 192 09/09/2020    CHOLHDL 129 09/09/2020    CHOLDLQ 80 01/27/2018    CHOLDLCAL 50 09/09/2020    TRIGLY 63 09/09/2020        Assessment/Plan:    76 y.o. male presenting to the [facility] with the following issues:    1. Type 1 Diabetes Mellitus  Lab Results   Component Value Date    HGBA1C 7.0 01/22/2021    HGBA1C 7.9 (H) 09/09/2020    HGBA1C 8.1 05/10/2020     Uncontrolled T1DM.  No known DM complications. Complicated by severe hypoglycemia.    A1C goal < 7.5%: to reduce risk of diabetes related complications to the eyes, feet and kidneys with additional goal of hypoglycemia risk reduction.  Blood sugars are mostly normal with more hypoglycemia given lack of CGM. Patient given a dexcom CGM sample. Daughter will reach out to company to verify insurance coverage.    Will continue same regimen. Hypoglycemia precautions addressed with patient.    -Please see new DM regimen below in patient instructions   -High frequency glucose monitoring with CGM   -Hypoglycemia protocol reviewed. ''Rule of 15'' handout given also. Carry glucose tabs at all times.   -Check glucose prior to driving. Glucose must be > 100 mg/dl. Reviewed risks related to driving with hypoglycemia   -Glucagon reviewed.  Baqsimi (glucagon) nasal spray if possible.  Ordered.  Waiting for coupon.     2. Hypertension   -Continue current regimen, including ACE-I inhibitor or ARB therapy    3. Hyperlipidemia   Per AHA guidelines, statin therapy is recommended with history of diabetes mellitus   -Continue statin therapy.    4. Diabetic microvascular disease screening:  Health Maintenance   Topic Date Due   ? Diabetes: Hemoglobin A1c Blood Test  03/12/2021   ? Diabetes: Dilated Eye Exam  11/19/2021   ? Diabetes: Kidney Monitoring  12/02/2021     5. ETOH use.  Hypoglycemia precautions discussed with patient.     6. Sleep Disorder.  Dtr suspects OSA.  See PMD re: eval.    RTC in 5-6 monhts    Orders Placed This Encounter   ? POCT glycosylated hemoglobin (HgbA1C)     I spent 45 minutes counseling and coordinating care for the items checked below on the day of service  [x]  Preparing to see the patient (e.g., review of tests)  [x]  Obtaining and or reviewing separately obtained history   [x]  Performing a medically appropriate examination and/or evaluation   [x]  Counseling and educating the patient/family/caregiver   [x]  Ordering medications, tests, or procedures  []  Referring and communicating with other healthcare professionals (when not separately reported)  [x]  Documenting clinical information in the EHR  [x]  Independently interpreting results and communicating results to patient/ family/caregiver    Author: Erie Noe A. Noreene Filbert, MD 01/22/2021 10:15 AM     There are no Patient Instructions on file for this visit.

## 2021-01-24 MED ORDER — BASAGLAR KWIKPEN 100 UNIT/ML SC SOPN
9 refills | Status: AC
Start: 2021-01-24 — End: ?

## 2021-01-31 MED ORDER — FUROSEMIDE 20 MG PO TABS
ORAL_TABLET | 0 refills
Start: 2021-01-31 — End: ?

## 2021-02-06 MED ORDER — FUROSEMIDE 20 MG PO TABS
ORAL_TABLET | 0 refills | Status: AC
Start: 2021-02-06 — End: ?

## 2021-02-08 NOTE — Progress Notes
Please assist

## 2021-03-02 MED ORDER — SIMVASTATIN 20 MG PO TABS
ORAL_TABLET | 2 refills
Start: 2021-03-02 — End: ?

## 2021-03-03 MED ORDER — AMLODIPINE BESY-BENAZEPRIL HCL 5-20 MG PO CAPS
1 | ORAL_CAPSULE | Freq: Two times a day (BID) | ORAL | 0 refills | 60.00000 days
Start: 2021-03-03 — End: ?

## 2021-03-04 MED ORDER — SIMVASTATIN 20 MG PO TABS
ORAL_TABLET | 2 refills | Status: AC
Start: 2021-03-04 — End: ?

## 2021-03-05 MED ORDER — AMLODIPINE BESY-BENAZEPRIL HCL 5-20 MG PO CAPS
1 | ORAL_CAPSULE | Freq: Two times a day (BID) | ORAL | 1 refills | Status: AC
Start: 2021-03-05 — End: ?

## 2021-03-19 ENCOUNTER — Telehealth: Payer: BLUE CROSS/BLUE SHIELD

## 2021-03-19 NOTE — Telephone Encounter
-----   Message from Edman Circle sent at 02/07/2021  4:02 PM PST -----        ----- Message -----  From: Su Hilt., MD  Sent: 01/22/2021  10:45 AM PST  To: [facility]End[facility] Admin Pool    Please assist with dexcom CGM. Thank you!

## 2021-03-19 NOTE — Telephone Encounter
Called [facility] at [phone]. Was informed pt is good until April. They will sent CMN by then. Pt was already sent new supplies.

## 2021-04-25 MED ORDER — FUROSEMIDE 20 MG PO TABS
ORAL_TABLET | 0 refills
Start: 2021-04-25 — End: ?

## 2021-05-04 MED ORDER — FUROSEMIDE 20 MG PO TABS
20 mg | ORAL_TABLET | ORAL | 0 refills
Start: 2021-05-04 — End: ?

## 2021-05-15 MED ORDER — FUROSEMIDE 20 MG PO TABS
20 mg | ORAL_TABLET | Freq: Every day | ORAL | 0 refills | Status: AC
Start: 2021-05-15 — End: ?

## 2021-05-15 MED ORDER — FUROSEMIDE 20 MG PO TABS
ORAL_TABLET | 0 refills
Start: 2021-05-15 — End: ?

## 2021-05-29 ENCOUNTER — Ambulatory Visit: Payer: BLUE CROSS/BLUE SHIELD

## 2021-06-03 ENCOUNTER — Ambulatory Visit: Payer: BLUE CROSS/BLUE SHIELD

## 2021-06-04 ENCOUNTER — Telehealth: Payer: BLUE CROSS/BLUE SHIELD

## 2021-06-04 NOTE — Telephone Encounter
PDL Call to Clinic    Reason for Call: Pt's daughter Wells Guiles states that she had a missed call a couple mins ago but wasn't able to answer on time. There as no VM left because her VM is not set up and there are no encounters. Per Angela Nevin they have no notes regarding them giving her a call.     Appointment Related?  '[]'$  Yes  '[x]'$  No     If yes;  Date:  Time:    Call warm transferred to PDL: '[x]'$  Yes  '[]'$  No    Call Received by Clinic Representative: Angela Nevin    If call not answered/not accepted, call received by Patient Services Representative:

## 2021-06-24 ENCOUNTER — Ambulatory Visit: Payer: BLUE CROSS/BLUE SHIELD

## 2021-06-24 DIAGNOSIS — E785 Hyperlipidemia, unspecified: Secondary | ICD-10-CM

## 2021-06-24 DIAGNOSIS — E109 Type 1 diabetes mellitus without complications: Secondary | ICD-10-CM

## 2021-06-24 DIAGNOSIS — I10 Essential (primary) hypertension: Secondary | ICD-10-CM

## 2021-06-24 MED ORDER — BD PEN NEEDLE NANO U/F 32G X 4 MM MISC
9 refills | Status: AC
Start: 2021-06-24 — End: ?

## 2021-06-24 NOTE — Progress Notes
PATIENT: Chad Watkins  MRN: [id number]  DOB: Aug 19, 1945  DATE OF SERVICE: 06/24/2021    REFERRING PRACTITIONER: Lubertha Sayres., MD   PRIMARY CARE PROVIDER: Trena Platt., DO    Reason for Consultation: Type 1 Diabetes Mellitus    Subjective:     History of Present Illness  Chad Watkins is a 76 y.o. male with type 1 diabetes mellitus. The patient presents for follow-up to the [facility].       Type 1 diabetes was diagnosed in 1989, and treated with insulin then.     He denies hx of DKA, but has had ''comas'' attributed to hypoglycemia that required paramedic visits, twice in July 2019.      Interval events:  Here for follow-up.   Notes almost low sugars in the morning.   Afraid to give himself too much insulin at work due to fear of lows   Reports that he had higher sugars when sick with covid.     Patient is testing BG 4x daily when not using CGM.   Patient uses insulin 4x daily.     When patient has CGM device, patient uses it consistently.   Patient tests blood glucose to calibrate their CGM.   Patient continues to dose insulin >3x/day and frequently adjusts insulin dosage based on the CGM device information.      Current DM Medications  Continuar Lantus 7 units en la manana    Para desayuno    Blood glucose   Humalog Insulin  101-200    4  Units  201-300    5 units  >301     6 units     Para Lonche y Cena    Blood glucose   Humalog Insulin  101-200    3  Units  201-300    4 units  >301     5 units     Glucose Review:  Data from glucose meter:      Dexcom Personal CGM Physician Interpretation Report  Dates Reviewed   (> 72 hours):  06/11/2021-06/24/2021   Avg Glucose:   203   % Serious Low (<54 mg/dL):  <1   % Low (<44 mg/dL):  1   % In Target (03-474 mg/dL):  44   % High (259-563 mg/dL):  27   % Serious High (> 250 mg/dL):  27   SD:   82      In examining the patterns and providing interpretation, I noted 1) pp hyperglycemia with lunch and dinner when working 2) borderline fasting hypoglycemia due to excess basal insulin. Based on CGM interpetration, I recommend 1) decrease basal insulin 2) inject mealtime insulin in non-scarred regions.    Exercise:   Walks. On his feet 8 hrs at work all day.      Complications & Associated Co-morbidities Yes No Comments/Treatment to Date   Retinopathy  []    [x]      Nephropathy  []    [x]   Lab Results   Component Value Date    ALBCREATUR  09/09/2020      Comment:      Unable to calculate. Test result is?below detection?limit.    CREAT 0.94 09/09/2020       Neuropathy  []    [x]      Gastroparesis  []   [x]     CAD  []    [x]      Stroke  []    [x]      PVD  []   [  x]    Sleep Apnea  [x]    []   Not sure. Poor sleep?       Past Medical History  Past Medical History:   Diagnosis Date   ? Aortic atherosclerosis (HCC/RAF) 03/13/2020   ? Colon polyps-2020 09/19/2019   ? Diabetic neuropathy (HCC/RAF) 01/27/2018   ? Diabetic peripheral vascular disease (HCC/RAF) 09/03/2017   ? DM type 1 with diabetic mixed hyperlipidemia (HCC/RAF) 09/03/2017   ? Edema leg 07/14/2018   ? Glaucoma     Suspect   ? History of fracture of left ankle 12/01/2017   ? Hypertension associated with diabetes (HCC/RAF) 09/03/2017   ? Long term (current) use of insulin (HCC/RAF) 09/19/2019   ? Peripheral vascular disease (HCC/RAF) 09/03/2017   ? Tortuous aorta (HCC/RAF) 03/13/2020   ? Trigger thumb 01/27/2018   ? Type 1 diabetes mellitus with diabetic neuropathy, unspecified (HCC/RAF) 09/03/2017       Past Surgical History  Past Surgical History:   Procedure Laterality Date   ? APPENDECTOMY     ? YAG CAPSULOTOMY Left 10/14/2020   ? YAG CAPSULOTOMY Right 11/11/2020       Medications  Current Outpatient Medications   Medication Sig   ? acetaminophen-codeine (TYLENOL #3) 300-30 mg tablet    ? acetaminophen-codeine (TYLENOL #3) 300-30 mg tablet Take 1-2 tablets by mouth every six (6) hours as needed. Max Daily Amount: 8 tablets   ? AMLODIPINE-BENAZEPRIL 5-20 mg capsule TAKE 1 CAPSULE BY MOUTH TWO (2) TIMES DAILY.   ? cephalexin 500 mg capsule Take 1 capsule (500 mg total) by mouth four (4) times daily.   ? ciclopirox 8% solution Apply topically at bedtime Apply over nail and surrounding skin..   ? FLUZONE HIGH-DOSE 0.5 ML syringe    ? furosemide 20 mg tablet Take 1 tablet (20 mg total) by mouth daily.   ? gatifloxacin 0.5% ophthalmic solution    ? glucagon (BAQSIMI TWO PACK) 3 mg/dose nasal powder 1 actuation (3 mg total) by Left Nare route every fifteen (15) minutes as needed.   ? glucose blood test strip Use as instructed   ? ibuprofen 600 mg tablet Take 1 tablet (600 mg total) by mouth every six (6) hours as needed.   ? insulin aspart, NovoLOG, (NOVOLOG FLEXPEN) 100 units/mL injection pen Inject 0.02-0.08 mLs (2-8 Units total) under the skin three (3) times daily before meals.   ? insulin glargine (BASAGLAR KWIKPEN) 100 units/mL injection pen Use 6 units daily. Waste 2 units to prime the needle.   ? LOTEMAX SM 0.38 % GEL    ? metFORMIN (GLUCOPHAGE XR) 500 mg PO ER 24 hr tablet Take 2 tablets (1,000 mg total) by mouth two (2) times daily with meals.   ? ONETOUCH ULTRA test strip USE 1 TEST STRIP TESTING 5 TIMES A DAY.   ? PROLENSA 0.07 % SOLN    ? SHINGRIX 50 MCG/0.5ML injection    ? SILDENAFIL 100 mg tablet TOME UNA TABLETA TODOS LOS DIAS CUANDO SEA NECESARIO FOR ERECTILE DYSFUNCTION   ? simvastatin (ZOCOR) 20 mg tablet TOME UNA TABLETA DIARIAMENTE   ? SIMVASTATIN 20 mg tablet TAKE 1 TABLET BY MOUTH EVERYDAY AT BEDTIME   ? Insulin Pen Needle (BD AUTOSHIELD DUO) 30G X 5 MM MISC Use to inject insulin up to 6x daily.     No current facility-administered medications for this visit.     Allergies  Patient has no known allergies.    Family History  Family History   Problem  Relation Age of Onset   ? Glaucoma Neg Hx    ? Macular degeneration Neg Hx        Social History  Social History     Socioeconomic History   ? Marital status: Married   ? Number of children: 4   Occupational History   ? Occupation: Soil scientist Tobacco Use   ? Smoking status: Never   ? Smokeless tobacco: Never   Substance and Sexual Activity   ? Alcohol use: Yes     Alcohol/week: 4.2 oz     Types: 7 Standard drinks or equivalent per week       Objective:      Vitals: BP 129/57  ~ Pulse 67  ~ Ht 5' 4'' (1.626 m)  ~ Wt 145 lb (65.8 kg)  ~ SpO2 98%  ~ BMI 24.89 kg/m?    Wt Readings from Last 3 Encounters:   06/24/21 145 lb (65.8 kg)   01/22/21 152 lb 12.8 oz (69.3 kg)   09/10/20 144 lb (65.3 kg)            Additional Findings   General  [x]  NAD  [x]  Well nourished      Eyes  [x]  Moist conjunctivae   [x]  Anicteric sclera []  PERRL  [x]  No lid lag    HENT  [x]  Normocephalic  [x]  Atraumatic   []  oropharynx clear with moist mucous membranes  []  no caries    Neck  []  Neck supple    []  No JVD  []  No cervical fat pad     Thyroid  []  Normal size  []  No masses/nodules  []  Non-tender  []  No bruit    Lymph  []  No cervical LAD   []  No axillary LAD      Respiratory  []  CTABL  []  No wheezes  []  No crackles []  Normal effort    CV  []  RRR  []  Normal S1/S2  []  No m/r/g       [x]  Pedal pulses 2+  []  No carotid bruit  [x]  No LE edema     Abdomen:  []  Soft  []  Non-tender  []  No organomegaly  []  Normal BS    MSK  []  Normal gait  []  Normal ROM   []  Normal muscle tone  []  No joint tenderness    Neuro  [x]  Normal       monofilament  []  Normal vibratory sense  [x]  Normal strength  []  DTR 2+    Derm  [x]  No foot ulcers  [x]  No rashes   []  No lipohypertrophy   +onychomycosis in toenailes  +Liphypertrophy in midabdomen BL   Psych  [x]  Appropriate affect  [x]  Alert and oriented x3          Lab Review:  Lab Results   Component Value Date    HGBA1C 7.0 01/22/2021    HGBA1C 7.9 (H) 09/09/2020    HGBA1C 8.1 05/10/2020      Lab Results   Component Value Date    ALBCREATUR  09/09/2020      Comment:      Unable to calculate. Test result is?below detection?limit.    CHOLDLCAL 50 09/09/2020    CHOLDLQ 80 01/27/2018    CPEPTIDE <0.2 (L) 09/03/2017    GLUCOSE 89 09/09/2020      Lab Results Component Value Date    BUN 18 09/09/2020    CL 96 09/09/2020    CO2 24 09/09/2020    CREAT 0.94 09/09/2020  K 5.1 09/09/2020    NA 138 09/09/2020      Lab Results   Component Value Date    HCT 36.2 (L) 09/09/2020    HGB 12.1 (L) 09/09/2020    MCV 92.6 09/09/2020    PLT 215 09/09/2020    WBC 5.53 09/09/2020      No results found for: ''OSMOLALITY''   Lab Results   Component Value Date    KETONESUR Negative 09/09/2020      No results found for: ''PHART'', ''PHVEN''   Lab Results   Component Value Date    CHOL 192 09/09/2020    CHOLDLCAL 50 09/09/2020    CHOLDLQ 80 01/27/2018    CHOLHDL 129 09/09/2020    TRIGLY 63 09/09/2020        Assessment/Plan:    76 y.o. male presenting to the [facility] with the following issues:    1. Type 1 Diabetes Mellitus  Lab Results   Component Value Date    HGBA1C 7.0 01/22/2021    HGBA1C 7.9 (H) 09/09/2020    HGBA1C 8.1 05/10/2020     Uncontrolled T1DM.  No known DM complications. Complicated by severe hypoglycemia.    A1C goal < 7.5%: to reduce risk of diabetes related complications to the eyes, feet and kidneys with additional goal of hypoglycemia risk reduction.  On CGM, I noted 1) pp hyperglycemia with lunch and dinner when working 2) borderline fasting hypoglycemia due to excess basal insulin. Based on CGM interpetration, I recommend 1) decrease basal insulin 2) inject mealtime insulin in non-scarred regions.   -Please see new DM regimen below in patient instructions   -High frequency glucose monitoring with CGM   -Hypoglycemia protocol reviewed. ''Rule of 15'' handout given also. Carry glucose tabs at all times.   -Check glucose prior to driving. Glucose must be > 100 mg/dl. Reviewed risks related to driving with hypoglycemia   -Glucagon reviewed.  Baqsimi (glucagon) nasal spray if possible.  Ordered.  Waiting for coupon.     2. Hypertension   -Continue current regimen, including ACE-I inhibitor or ARB therapy    3. Hyperlipidemia   Per AHA guidelines, statin therapy is recommended with history of diabetes mellitus   -Continue statin therapy.    4. Diabetic microvascular disease screening:  Health Maintenance   Topic Date Due   ? Diabetes: Hemoglobin A1c Blood Test  07/22/2021   ? Diabetes: Dilated Eye Exam  11/19/2021   ? Diabetes: Kidney Monitoring  03/04/2022     5. ETOH use.  Hypoglycemia precautions discussed with patient.     6. Sleep Disorder.  Dtr suspects OSA.  See PMD re: eval.    RTC in 4 monhts    Orders Placed This Encounter   ? POCT Glycosylated Hemoglobin (Hb A1C) (AMBULATORY PROTOCOL - POCT HGB A1C)   ? insulin pen needle (B-D UF NANO PEN NEEDLES) 32G X 4 mm     41 minutes were spent personally by me today on this encounter which include today's pre-visit review of the chart, obtaining appropriate history, performing an evaluation, documentation and discussion of management with details supported within the note for today's visit. The time documented was exclusive of any time spent on the separately billed procedure.      Author: Jeri Cos. Noreene Filbert, MD 06/24/2021 10:42 AM     Patient Instructions   1) Reducir Lantus 6 en la manana  2) Si vas a tomar alcohol, necesitas bajar tu Humalog insulina por 50%  O 2 unidades  3) Si la azucar la tienes >200 mg/dl antes de comer, ponte la insulina y despues comer a los 30 minutos.      Para desayuno    Blood glucose   Humalog Insulin  101-200    4  Units  201-300    5 units  >301     6 units     Para Lonche y Cena    Blood glucose   Humalog Insulin  101-200    3  Units  201-300    4 units  >301     5 units

## 2021-06-24 NOTE — Patient Instructions
1) Reducir Lantus 6 en la manana  2) Si vas a tomar alcohol, necesitas bajar tu Humalog insulina por 50% O 2 unidades  3) Si la azucar la tienes >200 mg/dl antes de comer, ponte la insulina y despues comer a los 30 minutos.      Para desayuno    Blood glucose   Humalog Insulin  101-200    4  Units  201-300    5 units  >301     6 units     Para Lonche y Cena    Blood glucose   Humalog Insulin  101-200    3  Units  201-300    4 units  >301     5 units

## 2021-07-27 MED ORDER — FUROSEMIDE 20 MG PO TABS
ORAL_TABLET | 0 refills
Start: 2021-07-27 — End: ?

## 2021-07-28 MED ORDER — FUROSEMIDE 20 MG PO TABS
ORAL_TABLET | 0 refills
Start: 2021-07-28 — End: ?

## 2021-08-10 ENCOUNTER — Ambulatory Visit: Payer: BLUE CROSS/BLUE SHIELD

## 2021-08-10 ENCOUNTER — Inpatient Hospital Stay
Admit: 2021-08-10 | Discharge: 2021-08-10 | Disposition: A | Discharge status: Home / Self Care | Payer: BLUE CROSS/BLUE SHIELD | Source: Home / Self Care | Attending: Student in an Organized Health Care Education/Training Program

## 2021-08-10 DIAGNOSIS — S0990XA Unspecified injury of head, initial encounter: Secondary | ICD-10-CM

## 2021-08-10 DIAGNOSIS — S0101XA Laceration without foreign body of scalp, initial encounter: Secondary | ICD-10-CM

## 2021-08-10 LAB — HS Troponin I (Reflexed): HIGH SENSITIVITY TROPONIN I: 8 ng/L — ABNORMAL HIGH (ref <5)

## 2021-08-10 LAB — Aspartate Aminotransferase: ASPARTATE AMINOTRANSFERASE: 34 U/L (ref 13–62)

## 2021-08-10 LAB — HS Troponin I + Reflex If >=  5 ng/L: HIGH SENSITIVITY TROPONIN I: 5 ng/L — ABNORMAL HIGH (ref <5)

## 2021-08-10 LAB — Glucose, Whole Blood: GLUCOSE, WHOLE BLOOD: 254 mg/dL — ABNORMAL HIGH (ref 65–99)

## 2021-08-10 LAB — Urea Nitrogen: UREA NITROGEN: 13 mg/dL (ref 7–22)

## 2021-08-10 LAB — Phosphorus: PHOSPHORUS: 2.8 mg/dL (ref 2.3–4.4)

## 2021-08-10 LAB — CREATININE

## 2021-08-10 LAB — UA,Dipstick: LEUKOCYTE ESTERASE: NEGATIVE (ref 5.0–8.0)

## 2021-08-10 LAB — Prothrombin Time Panel: PROTHROMBIN TIME: 12.8 s (ref 11.5–14.4)

## 2021-08-10 LAB — Bilirubin,Total: BILIRUBIN,TOTAL: <0.2 mg/dL (ref 0.1–1.2)

## 2021-08-10 LAB — CBC: RED BLOOD CELL COUNT: 3.82 x10E6/uL — ABNORMAL LOW (ref 4.41–5.95)

## 2021-08-10 LAB — Alanine Aminotransferase: ALANINE AMINOTRANSFERASE: 18 U/L (ref 8–70)

## 2021-08-10 LAB — Alkaline Phosphatase: ALKALINE PHOSPHATASE: 83 U/L (ref 37–113)

## 2021-08-10 LAB — Electrolyte Panel: CHLORIDE: 91 mmol/L — ABNORMAL LOW (ref 96–106)

## 2021-08-10 LAB — Lipase: LIPASE: 32 U/L (ref 13–69)

## 2021-08-10 LAB — Calcium: CALCIUM: 9.3 mg/dL (ref 8.6–10.4)

## 2021-08-10 LAB — UA,Microscopic: RBCS HPF: 0 {cells}/[HPF] (ref 0–2)

## 2021-08-10 LAB — B-Type Natriuretic Peptide: BNP: 161 pg/mL — ABNORMAL HIGH (ref <100)

## 2021-08-10 LAB — Magnesium: MAGNESIUM: 1.4 meq/L (ref 1.4–1.9)

## 2021-08-10 MED ADMIN — FOLIC ACID IVPB: 1 mg | INTRAVENOUS | @ 12:00:00 | Stop: 2021-08-10 | NDC 39822110001

## 2021-08-10 MED ADMIN — SODIUM CHLORIDE 0.9 % IV BOLUS: 1000 mL | INTRAVENOUS | @ 12:00:00 | Stop: 2021-08-10 | NDC 00338004904

## 2021-08-10 MED ADMIN — TETANUS-DIPHTH-ACELL PERTUSSIS 5-2.5-18.5 LF-MCG/0.5 IM SUSY: .5 mL | INTRAMUSCULAR | @ 11:00:00 | Stop: 2021-08-10

## 2021-08-10 MED ADMIN — ONDANSETRON HCL 4 MG/2ML IJ SOLN: 4 mg | INTRAVENOUS | @ 12:00:00 | Stop: 2021-08-10 | NDC 60505613000

## 2021-08-10 MED ADMIN — LIDOCAINE-EPINEPHRINE 1 %-1:100000 IJ SOLN: 10 mL | INTRADERMAL | @ 13:00:00 | Stop: 2021-08-10 | NDC 00409317801

## 2021-08-10 MED ADMIN — MORPHINE SULFATE 4 MG/ML IV/IJ SOLN (MULTI-GPI): 4 mg | INTRAVENOUS | @ 12:00:00 | Stop: 2021-08-10 | NDC 00641612501

## 2021-08-10 MED ADMIN — THIAMINE HCL 100 MG/ML IJ SOLN: 200 mg | INTRAVENOUS | @ 12:00:00 | Stop: 2021-08-10 | NDC 25021050002

## 2021-08-10 NOTE — Discharge Instructions
Emergency Department Discharge Instructions      Summary of your visit  You have been evaluated in the [facility] Emergency Department today for a laceration and head injury after a fall.  Your evaluation included a physical exam, CT scans, and bloodwork and laceration repair.    Wound Care: Sutures  Your laceration was repaired in the ED with sutures. Please keep the area surrounding the laceration clean and dry. Please keep the area out of the sunlight for the next 6 months to help prevent scarring. You should have the staples 7-10 days by your primary care physician, or at your local urgent care or ER. If you develop redness or swelling at the site of your laceration please come back to the ER for a wound check.    Medications  You can take 600mg  ibuprofen every 6 hours or tylenol 650mg  every 6 hours as needed for pain. If needed, you can alternate these medications so that you take one medication every 3 hours. For instance, at noon take ibuprofen, then at 3pm take tylenol, then at 6pm take ibuprofen.  Talk with your healthcare provider before taking any medicines if you have a chronic condition such as diabetes, liver or kidney disease, stomach ulcers, or digestive bleeding, or are taking blood thinners.      Follow-Up  Please follow up with your primary care physician in 7-10 days for staple removal. You can also return to the ER for this service.  You can find a primary care physician at [facility] by calling [phone].    If you do not have a Primary Care Physician, please call your insurance company or you may call [phone] to establish care with a [facility] physician.  If you are uninsured, please call 2-1-1 to find a free or low-cost clinic in your area. 2-1-1 [place] is the central source for providing information and referrals for all health and human services in [facility] [place]. Our 2-1-1 phone line is open 24 hours, 7 days a week, with trained [employer] prepared to offer help with any situation, any time. Our community services go far beyond phone referrals - explore our website to learn more. If you are calling from outside [facility] or cannot directly dial 2-1-1, you can call [phone].      Return to the Emergency Department if you experience:  Fevers 100.4 ?F or greater  Worsening or uncontrolled pain  Persistent nausea and vomiting  Worsening headache  Confusion  Discharge from your laceration  Swelling, redness, or warmth around your laceration  Numbness, weakness, or tingling  Any other concerning symptoms     Thank you for choosing [facility] for your care. It was a pleasure taking part in your care today, and we wish you the best!

## 2021-08-10 NOTE — ED Notes
Report given to Vanessa RN.

## 2021-08-10 NOTE — ED Notes
Code Brain activated per Dr Elgie Congo

## 2021-08-10 NOTE — ED Provider Notes
[facility]  Emergency Department Service Report    Triage     [facility] Chad Watkins, a 76 y.o. male, presents with Fall (Pt BIBA for unwitnessed GLF, unknown LOC, - blood thinners, denies any HA and pain. Hx type I diabetes, recent hospitalization with multiple syncopal episodes per daughter)    Arrived on 08/10/2021 at 2:02 AM   Arrived by RA 59 [30]    ED Triage Vitals   Temp Temp Source BP Heart Rate Resp SpO2 O2 Device Pain Score Weight   08/10/21 0209 08/10/21 0209 08/10/21 0400 08/10/21 0400 08/10/21 0400 08/10/21 0400 08/10/21 0347 08/10/21 0209 08/10/21 0208   36.3 ?C (97.4 ?F) Oral 124/69 72 16 95 % None (Room air) Zero 72.6 kg (160 lb)       No Known Allergies     Initial Physician Contact       Comprehensive Exam Initiated  Contact Date: 08/10/21  Contact Time: 0209    History   HPI  76 hx alcohol use disorder, DM presenting for fall with head trauma after drinking alcohol tonight.    Fall unwitnessed, pt hit his head. Unsure if he lost consciousness. Not on blood thinners. Per patient he feels well. No headache. Endorses drinking earlier tonight. Not sure why he fell. No chest pain, back pain, neck pain, nausea, vomiting, abdominal pain. Able to move arms and legs. Majority of history provided by daughter. No history of MI, no fevers or chills. No vision changes. No history of alcohol withdrawal seizures.    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 Aortic atherosclerosis (HCC/RAF) 03/13/2020   ? Colon polyps-2020 09/19/2019   ? Diabetic neuropathy (HCC/RAF) 01/27/2018   ? Diabetic peripheral vascular disease (HCC/RAF) 09/03/2017   ? DM type 1 with diabetic mixed hyperlipidemia (HCC/RAF) 09/03/2017   ? Edema leg 07/14/2018   ? Glaucoma     Suspect   ? History of fracture of left ankle 12/01/2017   ? Hypertension associated with diabetes (HCC/RAF) 09/03/2017   ? Long term (current) use of insulin (HCC/RAF) 09/19/2019   ? Peripheral vascular disease (HCC/RAF) 09/03/2017   ? Tortuous aorta (HCC/RAF) 03/13/2020   ? Trigger thumb 01/27/2018   ? Type 1 diabetes mellitus with diabetic neuropathy, unspecified (HCC/RAF) 09/03/2017        Past Surgical History:   Procedure Laterality Date   ? APPENDECTOMY     ? YAG CAPSULOTOMY Left 10/14/2020   ? YAG CAPSULOTOMY Right 11/11/2020        Past Family History   family history is not on file.     Past Social History   he reports that he has never smoked. He has never used smokeless tobacco. He reports current alcohol use of about 4.2 oz of alcohol per week. No history on file for drug use and sexual activity.       Physical Exam   Physical Exam  Constitutional: Well appearing, no acute distress  Eyes: No conjunctival injection, no scleral icterus  HENT: External nose and ears atraumatic, mucous membranes moist, + stellate lac to posterior occiput, no exposed galea or bone, 2cm x 2cm  Neck: Supple, trachea midline  Cardiovascular: RRR, no cyanosis, no pedal edema   Pulmonary/Chest: Breathing comfortably on room air, equal bilateral chest rise, CTAB  Abdominal: Non-distended, soft, non-tender, no rebound/guarding  Musculoskeletal: Atraumatic, extremities without gross deformity  Neurologic: A/Ox3, no facial droop, CNII-XII grossly intact, moving all extremities willfully, answering questions appropriately   Skin: Warm & dry  Psychiatric: Normal affect & behavior    Triage vitals noted      Medical Decision Making   Chad Watkins is a 76 y.o. male hx alcohol use disorder, DM presenting for fall with head trauma after drinking alcohol tonight. Presentation is c/f possible intracranial injury after recent trauma.  Preliminary exam shows the following injuries: occipital hematoma.  At this time, the Pt appears HDS and shows no evidence of respiratory compromise, and I believe would be stable for CT imaging.  No gross neuro deficits concerning for stroke. Initial work up:  - CBC, BMP, PT/INR  - ECG, Trop  - UA  - CXR  - CT Brain Non-Con  - CT C Spine Non-Con  - Tdap     CT head and neck was negative for acute intracranial pathology/ fracture or subluxation, c-spine was cleared by my exam. Imaging was obtained as above showing no evidence of fracture or acute pathology.  Patient with posterior occiput lac requiring repair. Affected areas inspected, irrigated, stapled, and dressings applied. Wound care discussed. TDAP up to date. Patient provided morphine for pain.  No evidence of alcohol withdrawal, no hx severe alcohol withdrawal.      Patient remained stable and neurologically intact while in the emergency department . Discussed warning signs that would prompt return to ED.  Return to emergency department urgently if new or worsening symptoms develop.       MDM    Chart Review   Previous medical records requested.  Pertinent items reviewed.    ED Course      Laboratory Results     Labs Reviewed   CBC - Abnormal; Notable for the following components:       Result Value    Red Blood Cell Count 3.82 (*)     Hemoglobin 11.6 (*)     Hematocrit 33.4 (*)     All other components within normal limits   ELECTROLYTE PANEL - Abnormal; Notable for the following components:    Sodium 127 (*)     Chloride 91 (*)     Total CO2 19 (*)     All other components within normal limits   GLUCOSE - Abnormal; Notable for the following components:    Glucose 254 (*)     All other components within normal limits   BNP - Abnormal; Notable for the following components:    BNP 161 (*)     All other components within normal limits   HS TROPONIN I + REFLEX IF >= 5 NG/L - Abnormal; Notable for the following components:    High Sensitivity Troponin I 5 (*)     All other components within normal limits   UA,DIPSTICK - Abnormal; Notable for the following components:    Blood Trace (*)     Glucose 4+ (*)     All other components within normal limits   UREA NITROGEN - Normal   PROTHROMBIN TIME PANEL - Normal    Narrative: Hemolysis is present. Interpret with caution.   PHOSPHORUS - Normal   CALCIUM - Normal   MAGNESIUM - Normal   AST (SGOT) - Normal   ALT (SGPT) - Normal   BILIRUBIN,TOTAL - Normal   ALKALINE PHOSPHATASE - Normal   LIPASE - Normal   UA,MICROSCOPIC - Normal   CREATININE,WHOLE BLOOD  URINALYSIS,ROUTINE    Narrative:     The following orders were created for panel order Urinalysis (UA).  Procedure                               Abnormality         Status                     ---------                               -----------         ------                     UA,Dipstick[[id number]]                  Abnormal            Final result               UA,Microscopic[[id number]]               Normal              Final result                 Please view results for these tests on the individual orders.       Imaging Results     XR chest pa+lat (2 views)   Preliminary Result by Crecencio Mc., MD (07/23 0615)   IMPRESSION:      Normal cardiomediastinal silhouette. Calcified and mildly tortuous thoracic aorta.   Normal lung volumes. No consolidation. Mild bronchial wall thickening. Left basilar atelectasis.   No pleural effusions. No pneumothorax. Patchy opacification over the lower thoracic vertebral bodies seen only on lateral view, may represent a focal infectious/inflammatory process, likely in the left lung base.   No acute osseous abnormalities.      THIS IS A PRELIMINARY REPORT THAT HAS NOT BEEN REVIEWED BY AN ATTENDING RADIOLOGIST.      Dictated by: Luanne Bras   08/10/2021 6:15 AM      CT cervical spine wo contrast   Final Result by Dierdre Highman, MD (07/23 0445)   IMPRESSION:      Brain: No evidence of acute intracranial hemorrhage or mass effect. Scalp hematoma close to vertex. No calvarial fracture.      Cervical spine: No acute abnormality. Multilevel degenerative changes as described above.      I, Dierdre Highman, M.D., have personally reviewed this radiological study and agree with the provided report. Dictated by: Luanne Bras   08/10/2021 3:49 AM      Signed by: Dierdre Highman   08/10/2021 4:45 AM      CT code brain wo contrast   Final Result by Dierdre Highman, MD (07/23 0445)   IMPRESSION:      Brain: No evidence of acute intracranial hemorrhage or mass effect. Scalp hematoma close to vertex. No calvarial fracture.      Cervical spine: No acute abnormality. Multilevel degenerative changes as described above.      I, Dierdre Highman, M.D., have personally reviewed this radiological study and agree with the provided report.      Dictated by: Luanne Bras   08/10/2021 3:49 AM      Signed by: Dierdre Highman   08/10/2021 4:45 AM  Consults           Progress Notes / Reassessments     ED Course as of 08/12/21 0140   Sun Aug 10, 2021   0354 CT code brain wo contrast  IMPRESSION:  ?  Brain: No evidence of acute intracranial hemorrhage or mass effect.  ?  Cervical spine: No acute abnormality. Multilevel degenerative changes as described above. [RB]   0543 Lac repaired with staples. Labs largely unremarkable. Daughter feels comfortable taking patient home.  [RB]   0544 Will ambulation trial and DC [RB]      ED Course User Index  [RB] Glennon Mac., MD         ED Procedure Notes       Any ED procedures performed are documented on separate ED procedure notes.    Clinical Impression     1. Injury of head    2. Laceration of scalp without foreign body, initial encounter          Disposition and Follow-up   Disposition: Discharge [1]       Future Appointments   Date Time Provider Department Center   10/23/2021 10:30 AM Lubertha Sayres., MD [phone] [facility]   01/22/2022 11:00 AM Lubertha Sayres., MD [phone] [facility]       Follow up with:  Trena Platt., DO  [address]  [address]  [place] [address]  [phone]    Schedule an appointment as soon as possible for a visit in 2 days        Return precautions are specified on After Visit Summary.    Discharge Medication List as of 08/10/2021 6:24 AM          Orders Placed This Encounter   ? CT code brain wo contrast   ? XR chest pa+lat (2 views)   ? CT cervical spine wo contrast   ? CBC without differential   ? Electrolyte Panel (Na, K, Cl, CO2)   ? Urea Nitrogen   ? Creatinine   ? Glucose   ? Prothrombin time (PT/INR)   ? Phosphorus   ? Calcium   ? Magnesium   ? B-type natriuretic peptide (BNP)   ? AST (SGOT)   ? ALT (SGPT)   ? Bilirubin,Total   ? Alkaline Phosphatase   ? Lipase   ? HS Troponin + 2h reflex IF >= 5 ng/L   ? Urinalysis (UA)   ? UA,Dipstick   ? UA,Microscopic   ? HS Troponin I (Reflexed)   ? Cardiac monitoring   ? Ambulate patient   ? ECG, 12 lead - altered mental status   ? ECG, 12 lead   ? EKG REPORT   ? ED 12 LEAD ECG    ? ED 12 LEAD ECG    ? DISCONTD: Tdap vaccine (Boostrix) inj (Age 53 years and older) 0.5 mL   ? sodium chloride 0.9% IV soln bolus 1,000 mL   ? ondansetron 4 mg/2 mL inj 4 mg   ? thiamine 100 mg/mL inj 200 mg   ? folic acid 1 mg in dextrose 5% 50 mL IVPB   ? morphine 4 mg/mL inj 4 mg   ? lidocaine-EPINEPHrine 1 %-1:100000 inj 10 mL           Resident Signature        Glennon Mac., MD  Resident  08/12/21 [phone]

## 2021-08-11 MED ORDER — FUROSEMIDE 20 MG PO TABS
20 mg | ORAL_TABLET | Freq: Every day | ORAL | 0 refills
Start: 2021-08-11 — End: ?

## 2021-08-12 MED ORDER — FUROSEMIDE 20 MG PO TABS
20 mg | ORAL_TABLET | Freq: Every day | ORAL | 0 refills
Start: 2021-08-12 — End: ?

## 2021-08-14 ENCOUNTER — Telehealth: Payer: BLUE CROSS/BLUE SHIELD
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
## 2021-08-14 NOTE — ED Procedure Note
[facility]  Emergency Department Procedure Note    ED Procedures     Procedural Sedation  Lac Repair    Date/Time: 08/13/2021 5:51 PM    Performed by: Glennon Mac., MD  Authorized by: Franchot Heidelberg., MD    Consent:     Consent obtained:  Verbal    Consent given by:  Patient    Risks, benefits, and alternatives were discussed: yes      Risks discussed:  Infection, pain, poor cosmetic result and need for additional repair    Alternatives discussed:  No treatment  Universal protocol:     Procedure explained and questions answered to patient or proxy's satisfaction: yes      Relevant documents present and verified: yes      Test results available: yes      Imaging studies available: yes      Required blood products, implants, devices, and special equipment available: yes      Site/side marked: yes      Immediately prior to procedure, a time out was called: yes      Patient identity confirmed:  Verbally with patient and arm band  Anesthesia:     Anesthesia method:  Local infiltration    Local anesthetic:  Lidocaine 1% WITH epi  Laceration details:     Location:  Scalp    Scalp location:  Occipital    Length (cm):  4    Depth (mm):  0.5  Exploration:     Limited defect created (wound extended): no      Hemostasis achieved with:  Epinephrine and direct pressure    Contaminated: no    Treatment:     Amount of cleaning:  Standard    Irrigation solution:  Tap water    Irrigation method:  Syringe    Visualized foreign bodies/material removed: no      Debridement:  None    Undermining:  None    Scar revision: no      Layers/structures repaired:  Deep subcutaneous  Skin repair:     Repair method:  Staples    Number of staples:  8  Approximation:     Approximation:  Close  Repair type:     Repair type:  Simple  Post-procedure details:     Dressing:  Sterile dressing and antibiotic ointment    Procedure completion:  Dayton Scrape., MD  Resident  08/13/21 1753    ATTENDING NOTE    Procedures Performed    Procedure(s) performed: laceration repair  I was present during the key portions of the procedure(s) performed.     Daryl Eastern. Eusebio Me, MD  08/18/2021 10:38 AM         Franchot Heidelberg., MD  08/18/21 1038       Glennon Mac., MD  Resident  08/22/21 0023       Franchot Heidelberg., MD  08/22/21 [phone]

## 2021-08-15 ENCOUNTER — Ambulatory Visit: Payer: BLUE CROSS/BLUE SHIELD

## 2021-08-17 ENCOUNTER — Ambulatory Visit: Payer: BLUE CROSS/BLUE SHIELD

## 2021-08-17 DIAGNOSIS — F0781 Postconcussional syndrome: Secondary | ICD-10-CM

## 2021-08-17 DIAGNOSIS — S0101XD Laceration without foreign body of scalp, subsequent encounter: Secondary | ICD-10-CM

## 2021-08-17 DIAGNOSIS — E103293 Type 1 diabetes mellitus with mild nonproliferative diabetic retinopathy without macular edema, bilateral: Secondary | ICD-10-CM

## 2021-08-17 MED ORDER — FUROSEMIDE 20 MG PO TABS
20-40 mg | ORAL_TABLET | Freq: Every day | ORAL | 3 refills | Status: AC
Start: 2021-08-17 — End: ?

## 2021-08-17 MED ORDER — FUROSEMIDE 20 MG PO TABS
20 mg | ORAL_TABLET | Freq: Every day | ORAL | 3 refills | Status: AC
Start: 2021-08-17 — End: 2021-08-18

## 2021-08-18 ENCOUNTER — Ambulatory Visit: Payer: BLUE CROSS/BLUE SHIELD

## 2021-08-18 DIAGNOSIS — D649 Anemia, unspecified: Secondary | ICD-10-CM

## 2021-08-18 DIAGNOSIS — E871 Hypo-osmolality and hyponatremia: Secondary | ICD-10-CM

## 2021-08-19 ENCOUNTER — Ambulatory Visit: Payer: BLUE CROSS/BLUE SHIELD

## 2021-08-25 ENCOUNTER — Ambulatory Visit: Payer: BLUE CROSS/BLUE SHIELD

## 2021-08-26 ENCOUNTER — Ambulatory Visit: Payer: BLUE CROSS/BLUE SHIELD

## 2021-09-02 MED ORDER — AMLODIPINE BESY-BENAZEPRIL HCL 5-20 MG PO CAPS
1 | ORAL_CAPSULE | Freq: Two times a day (BID) | ORAL | 1 refills
Start: 2021-09-02 — End: ?

## 2021-09-09 MED ORDER — AMLODIPINE BESY-BENAZEPRIL HCL 5-20 MG PO CAPS
1 | ORAL_CAPSULE | Freq: Two times a day (BID) | ORAL | 1 refills | Status: AC
Start: 2021-09-09 — End: ?

## 2021-09-12 ENCOUNTER — Ambulatory Visit: Payer: BLUE CROSS/BLUE SHIELD

## 2021-09-15 ENCOUNTER — Telehealth: Payer: BLUE CROSS/BLUE SHIELD

## 2021-09-15 ENCOUNTER — Ambulatory Visit: Payer: BLUE CROSS/BLUE SHIELD

## 2021-09-15 DIAGNOSIS — R2689 Other abnormalities of gait and mobility: Secondary | ICD-10-CM

## 2021-09-15 DIAGNOSIS — R55 Syncope and collapse: Secondary | ICD-10-CM

## 2021-09-15 DIAGNOSIS — F109 Alcohol use disorder: Secondary | ICD-10-CM

## 2021-09-15 DIAGNOSIS — E1042 Type 1 diabetes mellitus with diabetic polyneuropathy: Secondary | ICD-10-CM

## 2021-09-15 NOTE — Progress Notes
Patient Consent to Telehealth   The patient agreed to participate in the video visit prior to joining the visit.      PATIENT: Chad Watkins  MRN: [id number]  DOB: 1945-02-08  DATE OF SERVICE: 09/15/2021    CHIEF COMPLAINT:   Chief Complaint   Patient presents with   ? Fall     Report not conscious while falling, denies actual head trauma.  Denies alcohol involvement.  Did lose continence in last fall.         Past Medical History:   Diagnosis Date   ? Alcohol use disorder 09/15/2021   ? Aortic atherosclerosis (HCC/RAF) 03/13/2020   ? Colon polyps-2020 09/19/2019   ? Diabetic neuropathy (HCC/RAF) 01/27/2018   ? Diabetic peripheral vascular disease (HCC/RAF) 09/03/2017   ? DM type 1 with diabetic mixed hyperlipidemia (HCC/RAF) 09/03/2017   ? Edema leg 07/14/2018   ? Glaucoma     Suspect   ? History of fracture of left ankle 12/01/2017   ? Hypertension associated with diabetes (HCC/RAF) 09/03/2017   ? Long term (current) use of insulin (HCC/RAF) 09/19/2019   ? Peripheral vascular disease (HCC/RAF) 09/03/2017   ? Tortuous aorta (HCC/RAF) 03/13/2020   ? Trigger thumb 01/27/2018   ? Type 1 diabetes mellitus with diabetic neuropathy, unspecified (HCC/RAF) 09/03/2017       Past Surgical History:   Procedure Laterality Date   ? APPENDECTOMY     ? YAG CAPSULOTOMY Left 10/14/2020   ? YAG CAPSULOTOMY Right 11/11/2020       Social History     Tobacco Use   Smoking Status Never   Smokeless Tobacco Never       Family History   Problem Relation Age of Onset   ? Glaucoma Neg Hx    ? Macular degeneration Neg Hx        Social History     Substance and Sexual Activity   Alcohol Use Yes   ? Alcohol/week: 4.2 oz   ? Types: 7 Standard drinks or equivalent per week       Social History     Substance and Sexual Activity   Drug Use Not on file       No Known Allergies    Patient Active Problem List   Diagnosis   ? Bursitis   ? Skin cancer   ? Palpitation   ? Need for vaccination   ? Healthcare maintenance   ? Abnormal EKG   ? Left ankle pain ? Hypertension associated with diabetes (HCC/RAF)   ? DM type 1 with diabetic mixed hyperlipidemia (HCC/RAF)   ? DM (diabetes mellitus), type 1, uncontrolled, periph vascular complic   ? Peripheral vascular disease (HCC/RAF)   ? DM (diabetes mellitus), type 1, uncontrolled w/neurologic complication   ? History of fracture of left ankle   ? Diabetic neuropathy (HCC/RAF)   ? Trigger thumb   ? Edema leg   ? Mild nonproliferative diabetic retinopathy (HCC/RAF)   ? Pseudophakia   ? Phyisological cupping of optic disc, right   ? Long term (current) use of insulin (HCC/RAF)   ? Colon polyps-2020   ? Aortic atherosclerosis (HCC/RAF)   ? Tortuous aorta (HCC/RAF)   ? Controlled type 1 diabetes with mild nonproliferative retinopathy (HCC/RAF)   ? Posterior capsular opacification   ? Ptosis of eyelid   ? Refractive error   ? Alcohol use disorder         Current Outpatient Medications:   ?  acetaminophen-codeine (TYLENOL #3)  300-30 mg tablet, , Disp: , Rfl: 0  ?  acetaminophen-codeine (TYLENOL #3) 300-30 mg tablet, Take 1-2 tablets by mouth every six (6) hours as needed. Max Daily Amount: 8 tablets, Disp: 15 tablet, Rfl: 0  ?  AMLODIPINE-BENAZEPRIL 5-20 mg capsule, TAKE 1 CAPSULE BY MOUTH TWO (2) TIMES DAILY., Disp: 180 capsule, Rfl: 1  ?  cephalexin 500 mg capsule, Take 1 capsule (500 mg total) by mouth four (4) times daily., Disp: 40 capsule, Rfl: 0  ?  ciclopirox 8% solution, Apply topically at bedtime Apply over nail and surrounding skin.., Disp: 1 bottle, Rfl: 3  ?  FLUZONE HIGH-DOSE 0.5 ML syringe, , Disp: , Rfl:   ?  furosemide 20 mg tablet, Take 1-2 tablets (20-40 mg total) by mouth daily., Disp: 180 tablet, Rfl: 3  ?  gatifloxacin 0.5% ophthalmic solution, , Disp: , Rfl:   ?  glucagon (BAQSIMI TWO PACK) 3 mg/dose nasal powder, 1 actuation (3 mg total) by Left Nare route every fifteen (15) minutes as needed., Disp: 3 each, Rfl: 1  ?  glucose blood test strip, Use as instructed, Disp: 100 each, Rfl: 12  ?  ibuprofen 600 mg tablet, Take 1 tablet (600 mg total) by mouth every six (6) hours as needed., Disp: 30 tablet, Rfl: 0  ?  insulin aspart, NovoLOG, (NOVOLOG FLEXPEN) 100 units/mL injection pen, Inject 0.02-0.08 mLs (2-8 Units total) under the skin three (3) times daily before meals., Disp: 27 mL, Rfl: 9  ?  insulin glargine (BASAGLAR KWIKPEN) 100 units/mL injection pen, Use 6 units daily. Waste 2 units to prime the needle., Disp: 9 mL, Rfl: 9  ?  insulin pen needle (B-D UF NANO PEN NEEDLES) 32G X 4 mm, Check BS 6 times daily.  DX Type 1 DM on insulin [E10.1, Z79.4]., Disp: 600 each, Rfl: 9  ?  Insulin Pen Needle (BD AUTOSHIELD DUO) 30G X 5 MM MISC, Use to inject insulin up to 6x daily., Disp: 500 each, Rfl: 9  ?  LOTEMAX SM 0.38 % GEL, , Disp: , Rfl:   ?  metFORMIN (GLUCOPHAGE XR) 500 mg PO ER 24 hr tablet, Take 2 tablets (1,000 mg total) by mouth two (2) times daily with meals., Disp: 360 tablet, Rfl: 3  ?  ONETOUCH ULTRA test strip, USE 1 TEST STRIP TESTING 5 TIMES A DAY., Disp: 500 strip, Rfl: 2  ?  PROLENSA 0.07 % SOLN, , Disp: , Rfl:   ?  SHINGRIX 50 MCG/0.5ML injection, , Disp: , Rfl:   ?  SILDENAFIL 100 mg tablet, TOME UNA TABLETA TODOS LOS DIAS CUANDO SEA NECESARIO FOR ERECTILE DYSFUNCTION, Disp: 10 tablet, Rfl: 10  ?  simvastatin (ZOCOR) 20 mg tablet, TOME UNA TABLETA DIARIAMENTE, Disp: 90 tablet, Rfl: 3  ?  SIMVASTATIN 20 mg tablet, TAKE 1 TABLET BY MOUTH EVERYDAY AT BEDTIME, Disp: 90 tablet, Rfl: 2    Health Maintenance   Topic Date Due   ? Hepatitis B Screening  Never done   ? Advance Directive  Never done   ? COVID-19 Vaccine(Tracks primary and booster doses, not sup/immunocomp) (5 - Moderna series) 07/01/2020   ? Annual Preventive Wellness Visit  09/18/2020   ? Influenza Vaccine (1) 09/19/2021   ? Diabetes: EYE EXAM  11/19/2021   ? Diabetes: HGB A1C  02/17/2022   ? Diabetes: NEPHROPATHY MONITORING  09/09/2022   ? Tdap/Td Vaccine (3 - Td or Tdap) 11/13/2029   ? Pneumococcal Vaccine  Completed   ? Hepatitis C Screening  Completed   ? Shingles (Shingrix) Vaccine  Completed   ? Statin prescribed for ASCVD Prevention or Treatment  Completed       Patient Care Team:  Trena Platt., DO as PCP - General (Family Medicine)      Subjective:      Chad Watkins is a 76 y.o. male.    Review of Systems   Constitutional: Negative.  Negative for activity change, appetite change, chills, diaphoresis, fatigue, fever and unexpected weight change.   HENT: Negative.  Negative for congestion, dental problem, drooling, ear discharge, ear pain, facial swelling, hearing loss, mouth sores, nosebleeds, postnasal drip, rhinorrhea, sinus pressure, sinus pain, sneezing, sore throat, tinnitus, trouble swallowing and voice change.    Eyes: Negative.  Negative for photophobia, pain, discharge, redness, itching and visual disturbance.   Respiratory: Negative.  Negative for apnea, cough, choking, chest tightness, shortness of breath, wheezing and stridor.    Cardiovascular: Negative.  Negative for chest pain, palpitations and leg swelling.   Gastrointestinal: Negative.  Negative for abdominal distention, abdominal pain, anal bleeding, blood in stool, constipation, diarrhea, nausea, rectal pain and vomiting.   Endocrine: Negative.  Negative for cold intolerance, heat intolerance, polydipsia, polyphagia and polyuria.   Genitourinary: Negative.  Negative for decreased urine volume, difficulty urinating, dysuria, enuresis, flank pain, frequency, genital sores, hematuria, penile discharge, penile pain, penile swelling, scrotal swelling, testicular pain and urgency.   Musculoskeletal: Positive for gait problem. Negative for arthralgias, back pain, joint swelling, myalgias, neck pain and neck stiffness.   Skin: Negative.  Negative for color change, pallor, rash and wound.   Allergic/Immunologic: Negative.  Negative for environmental allergies, food allergies and immunocompromised state.   Neurological: Positive for syncope. Negative for dizziness, tremors, seizures, facial asymmetry, speech difficulty, weakness, light-headedness, numbness and headaches.   Hematological: Negative.  Negative for adenopathy. Does not bruise/bleed easily.   Psychiatric/Behavioral: Negative.  Negative for agitation, behavioral problems, confusion, decreased concentration, dysphoric mood, hallucinations, self-injury, sleep disturbance and suicidal ideas. The patient is not nervous/anxious and is not hyperactive.    All other systems reviewed and are negative.        Objective:      Physical Exam  Vitals reviewed.   Constitutional:       General: He is not in acute distress.     Appearance: Normal appearance. He is well-developed and normal weight. He is not ill-appearing, toxic-appearing or diaphoretic.      Comments: There were no vitals taken for this visit.     HENT:      Head: Normocephalic and atraumatic.      Right Ear: External ear normal.      Left Ear: External ear normal.      Nose: Nose normal.   Eyes:      Conjunctiva/sclera: Conjunctivae normal.   Pulmonary:      Effort: Pulmonary effort is normal.   Musculoskeletal:      Cervical back: Normal range of motion and neck supple. No rigidity. No muscular tenderness.   Lymphadenopathy:      Cervical: No cervical adenopathy.   Skin:     General: Skin is warm and dry.   Neurological:      General: No focal deficit present.      Mental Status: He is alert and oriented to person, place, and time.   Psychiatric:         Mood and Affect: Mood normal.         Behavior: Behavior normal.  Thought Content: Thought content normal.         Judgment: Judgment normal.             ASSESSMENT AND PLAN     1. Syncope and collapse    2. Balance disorder    3. Hypertension associated with diabetes (HCC/RAF)    4. DM type 1 with diabetic mixed hyperlipidemia (HCC/RAF)    5. Peripheral vascular disease (HCC/RAF)    6. Aortic atherosclerosis (HCC/RAF)    7. Long term (current) use of insulin (HCC/RAF)    8. Diabetic polyneuropathy associated with type 1 diabetes mellitus (HCC/RAF)    9. Alcohol use disorder      Syncope vs seizure.  To neurology and MRI, also get gait and balance training PT for other times when just loses balance without consciousness.  Continue to reiterate need to stop alcohol.  Then we can focus on diabetes and tighten that up.  Balance is likely neuropathy from both DM and alcohol.  Possible chronic alcohol changes in brain.  Daughter reports on prior head scan there was a tiny aneurysm that they wanted to monitor?  Will follow up with that (but I did review 2 CT's of head and no mention of that.      Orders Placed This Encounter   ? MR brain wo+w contrast   ? MR brain angiogram wo+w contrast   ? Referral to Neurology   ? Referral to Rehabilitation, Physical Therapy       Wt Readings from Last 5 Encounters:   08/17/21 145 lb (65.8 kg)   08/10/21 160 lb (72.6 kg)   06/24/21 145 lb (65.8 kg)   01/22/21 152 lb 12.8 oz (69.3 kg)   09/10/20 144 lb (65.3 kg)     BP Readings from Last 5 Encounters:   08/17/21 130/64   08/10/21 127/71   06/24/21 129/57   01/22/21 134/70   11/11/20 108/58       Hgb A1c - HPLC   Date Value Ref Range Status   08/17/2021 8.2 (H) <5.7 % Final     Comment:     For patients with diabetes, an A1c less than (<) or equal (=) to 7.0% is recommended for most patients, however the goal may be higher or lower depending on age and/or other medical problems.   For a diagnosis of diabetes, A1c greater than (>) or equal(=) to 6.5% indicates diabetes; values between 5.7% and 6.4% may indicate an increased risk of developing diabetes.   09/09/2020 7.9 (H) <5.7 % Final     Comment:     For patients with diabetes, an A1c less than (<) or equal (=) to 7.0% is recommended for most patients, however the goal may be higher or lower depending on age and/or other medical problems.   For a diagnosis of diabetes, A1c greater than (>) or equal(=) to 6.5% indicates diabetes; values between 5.7% and 6.4% may indicate an increased risk of developing diabetes.   02/13/2020 8.6 (H) <5.7 % Final     Comment:     Confirmed by Repeat Analysis    For patients with diabetes, an A1c less than (<) or equal (=) to 7.0% is recommended for most patients, however the goal may be higher or lower depending on age and/or other medical problems.   For a diagnosis of diabetes, A1c greater than (>) or equal(=) to 6.5% indicates diabetes; values between 5.7% and 6.4% may indicate an increased risk of developing diabetes.  Hemoglobin A1C, Manual   Date Value Ref Range Status   06/24/2021 8.3 (Ref Range:4.2-6.5%) Note: For patients with diabetes, an A1c less than (<) or equal (=) to 7.0% is recommended for most patients, however the goal may be higher or lower depending on age and/or other medical problems. % Final   01/22/2021 7.0 (Ref Range:4.2-6.5%) Note: For patients with diabetes, an A1c less than (<) or equal (=) to 7.0% is recommended for most patients, however the goal may be higher or lower depending on age and/or other medical problems. % Final   05/10/2020 8.1 (Ref Range:4.2-6.5%) Note: For patients with diabetes, an A1c less than (<) or equal (=) to 7.0% is recommended for most patients, however the goal may be higher or lower depending on age and/or other medical problems. % Final     Cholesterol   Date Value Ref Range Status   08/17/2021 173 See Comment mg/dL Final     Comment:     The significance of total cholesterol depends on the values of LDL, HDL, triglycerides and the clinical context. A patient-provider discussion may be considered.       09/09/2020 192 See Comment mg/dL Final     Comment:     The significance of total cholesterol depends on the values of LDL, HDL, triglycerides and the clinical context. A patient-provider discussion may be considered.       02/13/2020 192 See Comment mg/dL Final     Comment:     The significance of total cholesterol depends on the values of LDL, HDL, triglycerides and the clinical context. A patient-provider discussion may be considered.         Cholesterol,LDL,Calc   Date Value Ref Range Status   08/17/2021 55 <100 mg/dL Final     Comment:     Patient is non-fasting, interpret with caution.  If LDL value falls outside of the designated range AND if  included in any of the following categories, a  patient-provider discussion is recommended.     Statin therapy is recommended for individuals:  1. with clinical atherosclerotic cardiovascular disease     irrespective of LDL levels;  2. with LDL > or = 190 mg/dL;  3. with diabetes, aged 40-75 years, with LDL between 70 and     189 mg/dL;  4. without any of the above but who have LDL between 70 and     189 mg/dL and an estimated 16-XWRU risk of     atherosclerotic cardiovascular disease > or = 7.5%     (consider statin therapy if estimated 10-year risk > or =     5.0%) (ACC/AHA 2013 Guidelines).   09/09/2020 50 <100 mg/dL Final     Comment:     Patient is non-fasting, interpret with caution.  If LDL value falls outside of the designated range AND if  included in any of the following categories, a  patient-provider discussion is recommended.     Statin therapy is recommended for individuals:  1. with clinical atherosclerotic cardiovascular disease     irrespective of LDL levels;  2. with LDL > or = 190 mg/dL;  3. with diabetes, aged 40-75 years, with LDL between 70 and     189 mg/dL;  4. without any of the above but who have LDL between 70 and     189 mg/dL and an estimated 04-VWUJ risk of     atherosclerotic cardiovascular disease > or = 7.5%     (consider statin therapy if estimated  10-year risk > or =     5.0%) (ACC/AHA 2013 Guidelines).   02/13/2020 52 <100 mg/dL Final     Comment:     If LDL value falls outside of the designated range AND if  included in any of the following categories, a  patient-provider discussion is recommended.     Statin therapy is recommended for individuals:  1. with clinical atherosclerotic cardiovascular disease     irrespective of LDL levels;  2. with LDL > or = 190 mg/dL;  3. with diabetes, aged 40-75 years, with LDL between 70 and     189 mg/dL;  4. without any of the above but who have LDL between 70 and     189 mg/dL and an estimated 19-JYNW risk of     atherosclerotic cardiovascular disease > or = 7.5%     (consider statin therapy if estimated 10-year risk > or =     5.0%) (ACC/AHA 2013 Guidelines).     Cholesterol, HDL   Date Value Ref Range Status   08/17/2021 109 >40 mg/dL Final     Comment:     If HDL cholesterol level falls outside of the designated  range, a patient-provider discussion is recommended   09/09/2020 129 >40 mg/dL Final     Comment:     If HDL cholesterol level falls outside of the designated  range, a patient-provider discussion is recommended   02/13/2020 127 >40 mg/dL Final     Comment:     If HDL cholesterol level falls outside of the designated  range, a patient-provider discussion is recommended     Triglycerides   Date Value Ref Range Status   08/17/2021 43 <150 mg/dL Final     Comment:     Patient is non-fasting, interpret with caution.  If Triglyceride level falls outside of the designated range,  a patient-provider discussion is recommended.     09/09/2020 63 <150 mg/dL Final     Comment:     Patient is non-fasting, interpret with caution.  If Triglyceride level falls outside of the designated range,  a patient-provider discussion is recommended.     02/13/2020 67 <150 mg/dL Final     Comment:     If Triglyceride level falls outside of the designated range,  a patient-provider discussion is recommended.        Creatinine   Date Value Ref Range Status   08/17/2021 0.84 0.60 - 1.30 mg/dL Final   29/56/2130 8.65 0.60 - 1.30 mg/dL Final   78/46/9629 5.28 0.60 - 1.30 mg/dL Final        PHQ-9 Results      10/19/2014     9:37 AM 10/19/2014     9:38 AM 09/11/2015    10:53 AM 12/24/2016    10:44 AM 01/22/2017    11:45 AM 01/27/2018     9:06 AM 09/19/2019     9:58 AM   Depression Screening (Patient Health Questionnaire PHQ) PHQ-2: Feeling down, depressed, or hopeless No No No No No No No   PHQ-2: Little interest or pleassure in doing things No No No No No No No       GAD-7 Results       No data to display                DAST Results       No data to display                Audit-C results  No data to display                  No follow-ups on file.  The above plan of care, diagnosis, orders, and follow-up were discussed with the patient.  Questions related to this recommended plan of care were answered.  Lonzo Cloud. Karolee Ohs, DO  09/15/2021    Author:  Lonzo Cloud. Leesha Veno 09/15/2021 1:38 PM

## 2021-09-18 ENCOUNTER — Ambulatory Visit: Payer: BLUE CROSS/BLUE SHIELD

## 2021-09-18 DIAGNOSIS — M79674 Pain in right toe(s): Secondary | ICD-10-CM

## 2021-09-18 DIAGNOSIS — B351 Tinea unguium: Secondary | ICD-10-CM

## 2021-09-18 DIAGNOSIS — L603 Nail dystrophy: Secondary | ICD-10-CM

## 2021-09-18 DIAGNOSIS — M79675 Pain in left toe(s): Secondary | ICD-10-CM

## 2021-09-18 NOTE — Progress Notes
SUBJECTIVE    Chad Watkins is a 76 y.o. male who presents with worsening pain in the nails on his feet.  he states that the pain is worse when ambulating in enclosed shoe gear.  he denies any active bleeding or drainage of the toenails.    PAST MEDIAL HISTORY     Past Medical History:   Diagnosis Date   ? Alcohol use disorder 09/15/2021   ? Aortic atherosclerosis (HCC/RAF) 03/13/2020   ? Colon polyps-2020 09/19/2019   ? Diabetic neuropathy (HCC/RAF) 01/27/2018   ? Diabetic peripheral vascular disease (HCC/RAF) 09/03/2017   ? DM type 1 with diabetic mixed hyperlipidemia (HCC/RAF) 09/03/2017   ? Edema leg 07/14/2018   ? Glaucoma     Suspect   ? History of fracture of left ankle 12/01/2017   ? Hypertension associated with diabetes (HCC/RAF) 09/03/2017   ? Long term (current) use of insulin (HCC/RAF) 09/19/2019   ? Peripheral vascular disease (HCC/RAF) 09/03/2017   ? Tortuous aorta (HCC/RAF) 03/13/2020   ? Trigger thumb 01/27/2018   ? Type 1 diabetes mellitus with diabetic neuropathy, unspecified (HCC/RAF) 09/03/2017       SOCIAL HISTORY     Social History     Socioeconomic History   ? Marital status: Married   ? Number of children: 4   Occupational History   ? Occupation: Soil scientist    Tobacco Use   ? Smoking status: Never   ? Smokeless tobacco: Never   Substance and Sexual Activity   ? Alcohol use: Yes     Alcohol/week: 4.2 oz     Types: 7 Standard drinks or equivalent per week       ALLERGIES   No Known Allergies    MEDICATIONS     Outpatient Medications Prior to Visit   Medication Sig   ? acetaminophen-codeine (TYLENOL #3) 300-30 mg tablet    ? acetaminophen-codeine (TYLENOL #3) 300-30 mg tablet Take 1-2 tablets by mouth every six (6) hours as needed. Max Daily Amount: 8 tablets   ? AMLODIPINE-BENAZEPRIL 5-20 mg capsule TAKE 1 CAPSULE BY MOUTH TWO (2) TIMES DAILY.   ? cephalexin 500 mg capsule Take 1 capsule (500 mg total) by mouth four (4) times daily.   ? ciclopirox 8% solution Apply topically at bedtime Apply over nail and surrounding skin..   ? FLUZONE HIGH-DOSE 0.5 ML syringe    ? furosemide 20 mg tablet Take 1-2 tablets (20-40 mg total) by mouth daily.   ? gatifloxacin 0.5% ophthalmic solution    ? glucagon (BAQSIMI TWO PACK) 3 mg/dose nasal powder 1 actuation (3 mg total) by Left Nare route every fifteen (15) minutes as needed.   ? glucose blood test strip Use as instructed   ? ibuprofen 600 mg tablet Take 1 tablet (600 mg total) by mouth every six (6) hours as needed.   ? insulin aspart, NovoLOG, (NOVOLOG FLEXPEN) 100 units/mL injection pen Inject 0.02-0.08 mLs (2-8 Units total) under the skin three (3) times daily before meals.   ? insulin glargine (BASAGLAR KWIKPEN) 100 units/mL injection pen Use 6 units daily. Waste 2 units to prime the needle.   ? insulin pen needle (B-D UF NANO PEN NEEDLES) 32G X 4 mm Check BS 6 times daily.  DX Type 1 DM on insulin [E10.1, Z79.4].   ? Insulin Pen Needle (BD AUTOSHIELD DUO) 30G X 5 MM MISC Use to inject insulin up to 6x daily.   ? LOTEMAX SM 0.38 % GEL    ? metFORMIN (  GLUCOPHAGE XR) 500 mg PO ER 24 hr tablet Take 2 tablets (1,000 mg total) by mouth two (2) times daily with meals.   ? ONETOUCH ULTRA test strip USE 1 TEST STRIP TESTING 5 TIMES A DAY.   ? PROLENSA 0.07 % SOLN    ? SHINGRIX 50 MCG/0.5ML injection    ? SILDENAFIL 100 mg tablet TOME UNA TABLETA TODOS LOS DIAS CUANDO SEA NECESARIO FOR ERECTILE DYSFUNCTION   ? simvastatin (ZOCOR) 20 mg tablet TOME UNA TABLETA DIARIAMENTE   ? SIMVASTATIN 20 mg tablet TAKE 1 TABLET BY MOUTH EVERYDAY AT BEDTIME     No facility-administered medications prior to visit.       OBJECTIVE     PHYSICAL EXAM:  GEN: pt is alert and oriented X 3.  VASCULAR: Dorsalis pedis and posterior tibial pulses are palpable bilateral.  NEURO: protective sensation is unchanged from previous visit.  DERM:  Skin has good tone and turgor, no active ulcerations or abrasions.  NAILS:   The following nails are thick and dystrophic, they harbor subungual debris and hyperkeratosis. They are tender with direct palpation of the nail plate.       Left      Right      1 2 3 4 5  1 2 3 4 5    Nails [x]  [x]  [x]  [x]  [x]   [x]  [x]  [x]  [x]  [x]                    No active bleeding or drainage is noted.  No clinical signs of bacterial infection.  MSK: Muscle strenght 5/5 in all 4 quadrants.    ASSESSMENT/PLAN     Painful onychomycosis with onychodystrophy.    ? Manual and mechanical debridement of all 10 toenails done in the office without complications.  Pt will continue to soak for any lingering toenail discomfort and f/u in the office as needed.  Topical solutions of antifungals as needed.         Clemon Devaul [name], DPM  09/18/2021

## 2021-09-21 ENCOUNTER — Ambulatory Visit: Payer: BLUE CROSS/BLUE SHIELD

## 2021-09-23 NOTE — Progress Notes
PATIENT: Chad Watkins  MRN: [id number]  DOB: Dec 13, 1945  DATE OF SERVICE: 08/17/2021    CHIEF COMPLAINT:   Chief Complaint   Patient presents with   ? Medication Refill   ? Follow-up     Larey Seat backwards and hit head.  Was seen in ER and staples placed.  Unknown if alcohol or diabetes related    Past Medical History:   Diagnosis Date   ? Alcohol use disorder 09/15/2021   ? Aortic atherosclerosis (HCC/RAF) 03/13/2020   ? Colon polyps-2020 09/19/2019   ? Diabetic neuropathy (HCC/RAF) 01/27/2018   ? Diabetic peripheral vascular disease (HCC/RAF) 09/03/2017   ? DM type 1 with diabetic mixed hyperlipidemia (HCC/RAF) 09/03/2017   ? Edema leg 07/14/2018   ? Glaucoma     Suspect   ? History of fracture of left ankle 12/01/2017   ? Hypertension associated with diabetes (HCC/RAF) 09/03/2017   ? Long term (current) use of insulin (HCC/RAF) 09/19/2019   ? Peripheral vascular disease (HCC/RAF) 09/03/2017   ? Tortuous aorta (HCC/RAF) 03/13/2020   ? Trigger thumb 01/27/2018   ? Type 1 diabetes mellitus with diabetic neuropathy, unspecified (HCC/RAF) 09/03/2017       Past Surgical History:   Procedure Laterality Date   ? APPENDECTOMY     ? YAG CAPSULOTOMY Left 10/14/2020   ? YAG CAPSULOTOMY Right 11/11/2020       Social History     Tobacco Use   Smoking Status Never   Smokeless Tobacco Never       Family History   Problem Relation Age of Onset   ? Glaucoma Neg Hx    ? Macular degeneration Neg Hx        Social History     Substance and Sexual Activity   Alcohol Use Yes   ? Alcohol/week: 4.2 oz   ? Types: 7 Standard drinks or equivalent per week       Social History     Substance and Sexual Activity   Drug Use Not on file       No Known Allergies    Patient Active Problem List   Diagnosis   ? Bursitis   ? Skin cancer   ? Palpitation   ? Need for vaccination   ? Healthcare maintenance   ? Abnormal EKG   ? Left ankle pain   ? Hypertension associated with diabetes (HCC/RAF)   ? DM type 1 with diabetic mixed hyperlipidemia (HCC/RAF)   ? DM (diabetes mellitus), type 1, uncontrolled, periph vascular complic   ? Peripheral vascular disease (HCC/RAF)   ? DM (diabetes mellitus), type 1, uncontrolled w/neurologic complication   ? History of fracture of left ankle   ? Diabetic neuropathy (HCC/RAF)   ? Trigger thumb   ? Edema leg   ? Mild nonproliferative diabetic retinopathy (HCC/RAF)   ? Pseudophakia   ? Phyisological cupping of optic disc, right   ? Long term (current) use of insulin (HCC/RAF)   ? Colon polyps-2020   ? Aortic atherosclerosis (HCC/RAF)   ? Tortuous aorta (HCC/RAF)   ? Controlled type 1 diabetes with mild nonproliferative retinopathy (HCC/RAF)   ? Posterior capsular opacification   ? Ptosis of eyelid   ? Refractive error   ? Alcohol use disorder         Current Outpatient Medications:   ?  acetaminophen-codeine (TYLENOL #3) 300-30 mg tablet, , Disp: , Rfl: 0  ?  acetaminophen-codeine (TYLENOL #3) 300-30 mg tablet, Take 1-2 tablets by mouth every  six (6) hours as needed. Max Daily Amount: 8 tablets, Disp: 15 tablet, Rfl: 0  ?  AMLODIPINE-BENAZEPRIL 5-20 mg capsule, TAKE 1 CAPSULE BY MOUTH TWO (2) TIMES DAILY., Disp: 180 capsule, Rfl: 1  ?  cephalexin 500 mg capsule, Take 1 capsule (500 mg total) by mouth four (4) times daily., Disp: 40 capsule, Rfl: 0  ?  ciclopirox 8% solution, Apply topically at bedtime Apply over nail and surrounding skin.., Disp: 1 bottle, Rfl: 3  ?  FLUZONE HIGH-DOSE 0.5 ML syringe, , Disp: , Rfl:   ?  furosemide 20 mg tablet, Take 1-2 tablets (20-40 mg total) by mouth daily., Disp: 180 tablet, Rfl: 3  ?  gatifloxacin 0.5% ophthalmic solution, , Disp: , Rfl:   ?  glucagon (BAQSIMI TWO PACK) 3 mg/dose nasal powder, 1 actuation (3 mg total) by Left Nare route every fifteen (15) minutes as needed., Disp: 3 each, Rfl: 1  ?  glucose blood test strip, Use as instructed, Disp: 100 each, Rfl: 12  ?  ibuprofen 600 mg tablet, Take 1 tablet (600 mg total) by mouth every six (6) hours as needed., Disp: 30 tablet, Rfl: 0  ?  insulin aspart, NovoLOG, (NOVOLOG FLEXPEN) 100 units/mL injection pen, Inject 0.02-0.08 mLs (2-8 Units total) under the skin three (3) times daily before meals., Disp: 27 mL, Rfl: 9  ?  insulin glargine (BASAGLAR KWIKPEN) 100 units/mL injection pen, Use 6 units daily. Waste 2 units to prime the needle., Disp: 9 mL, Rfl: 9  ?  insulin pen needle (B-D UF NANO PEN NEEDLES) 32G X 4 mm, Check BS 6 times daily.  DX Type 1 DM on insulin [E10.1, Z79.4]., Disp: 600 each, Rfl: 9  ?  Insulin Pen Needle (BD AUTOSHIELD DUO) 30G X 5 MM MISC, Use to inject insulin up to 6x daily., Disp: 500 each, Rfl: 9  ?  LOTEMAX SM 0.38 % GEL, , Disp: , Rfl:   ?  metFORMIN (GLUCOPHAGE XR) 500 mg PO ER 24 hr tablet, Take 2 tablets (1,000 mg total) by mouth two (2) times daily with meals., Disp: 360 tablet, Rfl: 3  ?  ONETOUCH ULTRA test strip, USE 1 TEST STRIP TESTING 5 TIMES A DAY., Disp: 500 strip, Rfl: 2  ?  PROLENSA 0.07 % SOLN, , Disp: , Rfl:   ?  SHINGRIX 50 MCG/0.5ML injection, , Disp: , Rfl:   ?  SILDENAFIL 100 mg tablet, TOME UNA TABLETA TODOS LOS DIAS CUANDO SEA NECESARIO FOR ERECTILE DYSFUNCTION, Disp: 10 tablet, Rfl: 10  ?  simvastatin (ZOCOR) 20 mg tablet, TOME UNA TABLETA DIARIAMENTE, Disp: 90 tablet, Rfl: 3  ?  SIMVASTATIN 20 mg tablet, TAKE 1 TABLET BY MOUTH EVERYDAY AT BEDTIME, Disp: 90 tablet, Rfl: 2    Health Maintenance   Topic Date Due   ? Hepatitis B Screening  Never done   ? Advance Directive  Never done   ? COVID-19 Vaccine(Tracks primary and booster doses, not sup/immunocomp) (5 - Moderna series) 07/01/2020   ? Annual Preventive Wellness Visit  09/18/2020   ? Influenza Vaccine (1) 09/19/2021   ? Diabetes: EYE EXAM  11/19/2021   ? Diabetes: HGB A1C  02/17/2022   ? Diabetes: NEPHROPATHY MONITORING  09/09/2022   ? Tdap/Td Vaccine (3 - Td or Tdap) 11/13/2029   ? Pneumococcal Vaccine  Completed   ? Hepatitis C Screening  Completed   ? Shingles (Shingrix) Vaccine  Completed   ? Statin prescribed for ASCVD Prevention or Treatment Completed  Patient Care Team:  Trena Platt., DO as PCP - General (Family Medicine)      Subjective:      Edwen Mclester Hoogland is a 76 y.o. male.    Review of Systems      Objective:      Physical Exam        ASSESSMENT AND PLAN     1. Laceration of occipital region of scalp, subsequent encounter    2. Hypertension associated with diabetes (HCC/RAF)    3. DM type 1 with diabetic mixed hyperlipidemia (HCC/RAF)    4. Peripheral vascular disease (HCC/RAF)    5. Aortic atherosclerosis (HCC/RAF)    6. DM (diabetes mellitus), type 1, uncontrolled w/neurologic complication    7. Long term (current) use of insulin (HCC/RAF)    8. Controlled type 1 diabetes mellitus with both eyes affected by mild nonproliferative retinopathy without macular edema (HCC/RAF)    9. Post concussion syndrome      Staples removed w/o issue.  Discussed lifestyle, alcohol, diabetes, etc...    Orders Placed This Encounter   ? CBC & Auto Differential   ? Hgb A1c   ? Folate,Serum   ? Lipid Panel   ? Vitamin D,25-Hydroxy   ? Vitamin B12   ? Urinalysis w/Reflex to Culture   ? TSH with reflex FT4, FT3   ? Magnesium   ? Uric Acid   ? Comprehensive Metabolic Panel   ? PSA,Free & Total Profile   ? Testosterone, Bioavailable and Total, Includes Sex Hormone-Binding Globulin (Adult Males or Individuals on Testosterone Hormone Therapy) (Sendout)   ? PTH, Intact   ? Referral to Podiatry   ? DISCONTD: furosemide 20 mg tablet   ? furosemide 20 mg tablet       Wt Readings from Last 5 Encounters:   08/17/21 145 lb (65.8 kg)   08/10/21 160 lb (72.6 kg)   06/24/21 145 lb (65.8 kg)   01/22/21 152 lb 12.8 oz (69.3 kg)   09/10/20 144 lb (65.3 kg)     BP Readings from Last 5 Encounters:   08/17/21 130/64   08/10/21 127/71   06/24/21 129/57   01/22/21 134/70   11/11/20 108/58       Hgb A1c - HPLC   Date Value Ref Range Status   08/17/2021 8.2 (H) <5.7 % Final     Comment:     For patients with diabetes, an A1c less than (<) or equal (=) to 7.0% is recommended for most patients, however the goal may be higher or lower depending on age and/or other medical problems.   For a diagnosis of diabetes, A1c greater than (>) or equal(=) to 6.5% indicates diabetes; values between 5.7% and 6.4% may indicate an increased risk of developing diabetes.   09/09/2020 7.9 (H) <5.7 % Final     Comment:     For patients with diabetes, an A1c less than (<) or equal (=) to 7.0% is recommended for most patients, however the goal may be higher or lower depending on age and/or other medical problems.   For a diagnosis of diabetes, A1c greater than (>) or equal(=) to 6.5% indicates diabetes; values between 5.7% and 6.4% may indicate an increased risk of developing diabetes.   02/13/2020 8.6 (H) <5.7 % Final     Comment:     Confirmed by Repeat Analysis    For patients with diabetes, an A1c less than (<) or equal (=) to 7.0% is recommended for most patients, however  the goal may be higher or lower depending on age and/or other medical problems.   For a diagnosis of diabetes, A1c greater than (>) or equal(=) to 6.5% indicates diabetes; values between 5.7% and 6.4% may indicate an increased risk of developing diabetes.     Hemoglobin A1C, Manual   Date Value Ref Range Status   06/24/2021 8.3 (Ref Range:4.2-6.5%) Note: For patients with diabetes, an A1c less than (<) or equal (=) to 7.0% is recommended for most patients, however the goal may be higher or lower depending on age and/or other medical problems. % Final   01/22/2021 7.0 (Ref Range:4.2-6.5%) Note: For patients with diabetes, an A1c less than (<) or equal (=) to 7.0% is recommended for most patients, however the goal may be higher or lower depending on age and/or other medical problems. % Final   05/10/2020 8.1 (Ref Range:4.2-6.5%) Note: For patients with diabetes, an A1c less than (<) or equal (=) to 7.0% is recommended for most patients, however the goal may be higher or lower depending on age and/or other medical problems. % Final     Cholesterol Date Value Ref Range Status   08/17/2021 173 See Comment mg/dL Final     Comment:     The significance of total cholesterol depends on the values of LDL, HDL, triglycerides and the clinical context. A patient-provider discussion may be considered.       09/09/2020 192 See Comment mg/dL Final     Comment:     The significance of total cholesterol depends on the values of LDL, HDL, triglycerides and the clinical context. A patient-provider discussion may be considered.       02/13/2020 192 See Comment mg/dL Final     Comment:     The significance of total cholesterol depends on the values of LDL, HDL, triglycerides and the clinical context. A patient-provider discussion may be considered.         Cholesterol,LDL,Calc   Date Value Ref Range Status   08/17/2021 55 <100 mg/dL Final     Comment:     Patient is non-fasting, interpret with caution.  If LDL value falls outside of the designated range AND if  included in any of the following categories, a  patient-provider discussion is recommended.     Statin therapy is recommended for individuals:  1. with clinical atherosclerotic cardiovascular disease     irrespective of LDL levels;  2. with LDL > or = 190 mg/dL;  3. with diabetes, aged 40-75 years, with LDL between 70 and     189 mg/dL;  4. without any of the above but who have LDL between 70 and     189 mg/dL and an estimated 45-WUJW risk of     atherosclerotic cardiovascular disease > or = 7.5%     (consider statin therapy if estimated 10-year risk > or =     5.0%) (ACC/AHA 2013 Guidelines).   09/09/2020 50 <100 mg/dL Final     Comment:     Patient is non-fasting, interpret with caution.  If LDL value falls outside of the designated range AND if  included in any of the following categories, a  patient-provider discussion is recommended.     Statin therapy is recommended for individuals:  1. with clinical atherosclerotic cardiovascular disease     irrespective of LDL levels;  2. with LDL > or = 190 mg/dL;  3. with diabetes, aged 40-75 years, with LDL between 70 and     189 mg/dL;  4. without any of the above but who have LDL between 70 and     189 mg/dL and an estimated 16-XWRU risk of     atherosclerotic cardiovascular disease > or = 7.5%     (consider statin therapy if estimated 10-year risk > or =     5.0%) (ACC/AHA 2013 Guidelines).   02/13/2020 52 <100 mg/dL Final     Comment:     If LDL value falls outside of the designated range AND if  included in any of the following categories, a  patient-provider discussion is recommended.     Statin therapy is recommended for individuals:  1. with clinical atherosclerotic cardiovascular disease     irrespective of LDL levels;  2. with LDL > or = 190 mg/dL;  3. with diabetes, aged 40-75 years, with LDL between 70 and     189 mg/dL;  4. without any of the above but who have LDL between 70 and     189 mg/dL and an estimated 04-VWUJ risk of     atherosclerotic cardiovascular disease > or = 7.5%     (consider statin therapy if estimated 10-year risk > or =     5.0%) (ACC/AHA 2013 Guidelines).     Cholesterol, HDL   Date Value Ref Range Status   08/17/2021 109 >40 mg/dL Final     Comment:     If HDL cholesterol level falls outside of the designated  range, a patient-provider discussion is recommended   09/09/2020 129 >40 mg/dL Final     Comment:     If HDL cholesterol level falls outside of the designated  range, a patient-provider discussion is recommended   02/13/2020 127 >40 mg/dL Final     Comment:     If HDL cholesterol level falls outside of the designated  range, a patient-provider discussion is recommended     Triglycerides   Date Value Ref Range Status   08/17/2021 43 <150 mg/dL Final     Comment:     Patient is non-fasting, interpret with caution.  If Triglyceride level falls outside of the designated range,  a patient-provider discussion is recommended.     09/09/2020 63 <150 mg/dL Final     Comment:     Patient is non-fasting, interpret with caution.  If Triglyceride level falls outside of the designated range,  a patient-provider discussion is recommended.     02/13/2020 67 <150 mg/dL Final     Comment:     If Triglyceride level falls outside of the designated range,  a patient-provider discussion is recommended.        Creatinine   Date Value Ref Range Status   08/17/2021 0.84 0.60 - 1.30 mg/dL Final   81/19/1478 2.95 0.60 - 1.30 mg/dL Final   62/13/0865 7.84 0.60 - 1.30 mg/dL Final        PHQ-9 Results      10/19/2014     9:37 AM 10/19/2014     9:38 AM 09/11/2015    10:53 AM 12/24/2016    10:44 AM 01/22/2017    11:45 AM 01/27/2018     9:06 AM 09/19/2019     9:58 AM   Depression Screening (Patient Health Questionnaire PHQ)   PHQ-2: Feeling down, depressed, or hopeless No No No No No No No   PHQ-2: Little interest or pleassure in doing things No No No No No No No       GAD-7 Results       No data  to display                DAST Results       No data to display                Audit-C results       No data to display                  No follow-ups on file.  The above plan of care, diagnosis, orders, and follow-up were discussed with the patient.  Questions related to this recommended plan of care were answered.  Lonzo Cloud. Karolee Ohs, DO  09/22/2021    Author:  Lonzo Cloud. Alfreda Hammad 09/22/2021 7:46 PM

## 2021-10-08 ENCOUNTER — Telehealth: Payer: BLUE CROSS/BLUE SHIELD

## 2021-10-08 NOTE — Telephone Encounter
Printed and placed in Dr. Virgina Norfolk box for signature

## 2021-10-08 NOTE — Telephone Encounter
Call Back Request      Reason for call back:  Per Kat from [facility] she is requesting an update on form for transmitter she faxed on 9/18. Please assist. Thank you.    FAX: [phone]    Any Symptoms:  '[]'$  Yes  '[x]'$  No       If yes, what symptoms are you experiencing:    o Duration of symptoms (how long):    o Have you taken medication for symptoms (OTC or Rx):      If call was taken outside of clinic hours:    '[]'$ Patient or caller has been notified that this message was sent outside of normal clinic hours.     '[]'$ Patient or caller has been warm transferred to the physician's answering service. If applicable, patient or caller informed to please call us back if symptoms progress.  Patient or caller has been notified of the turnaround time of 1-2 business day(s).

## 2021-10-14 NOTE — Telephone Encounter
Faxed back to [facility]

## 2021-10-23 ENCOUNTER — Ambulatory Visit: Payer: BLUE CROSS/BLUE SHIELD

## 2021-10-24 ENCOUNTER — Ambulatory Visit: Payer: BLUE CROSS/BLUE SHIELD

## 2021-11-01 ENCOUNTER — Inpatient Hospital Stay: Payer: BLUE CROSS/BLUE SHIELD

## 2021-11-01 DIAGNOSIS — I700 Atherosclerosis of aorta: Secondary | ICD-10-CM

## 2021-11-01 DIAGNOSIS — R2689 Other abnormalities of gait and mobility: Secondary | ICD-10-CM

## 2021-11-01 DIAGNOSIS — E782 Mixed hyperlipidemia: Secondary | ICD-10-CM

## 2021-11-01 DIAGNOSIS — Z794 Long term (current) use of insulin: Secondary | ICD-10-CM

## 2021-11-01 DIAGNOSIS — I152 Hypertension secondary to endocrine disorders: Secondary | ICD-10-CM

## 2021-11-01 DIAGNOSIS — R55 Syncope and collapse: Secondary | ICD-10-CM

## 2021-11-01 DIAGNOSIS — E1069 Type 1 diabetes mellitus with other specified complication: Secondary | ICD-10-CM

## 2021-11-01 DIAGNOSIS — E1042 Type 1 diabetes mellitus with diabetic polyneuropathy: Secondary | ICD-10-CM

## 2021-11-01 DIAGNOSIS — E1159 Type 2 diabetes mellitus with other circulatory complications: Secondary | ICD-10-CM

## 2021-11-01 DIAGNOSIS — I739 Peripheral vascular disease, unspecified: Secondary | ICD-10-CM

## 2021-11-01 MED ADMIN — GADOBUTROL 1 MMOL/ML IV SOLN: 7.5 mL | INTRAVENOUS | @ 23:00:00 | Stop: 2021-11-01 | NDC 50419032511

## 2021-11-12 ENCOUNTER — Ambulatory Visit: Payer: BLUE CROSS/BLUE SHIELD | Attending: Neurology

## 2021-11-12 DIAGNOSIS — E785 Hyperlipidemia, unspecified: Secondary | ICD-10-CM

## 2021-11-12 DIAGNOSIS — R569 Unspecified convulsions: Secondary | ICD-10-CM

## 2021-11-12 DIAGNOSIS — G119 Hereditary ataxia, unspecified: Secondary | ICD-10-CM

## 2021-11-12 DIAGNOSIS — R55 Syncope and collapse: Secondary | ICD-10-CM

## 2021-11-12 DIAGNOSIS — Z794 Long term (current) use of insulin: Secondary | ICD-10-CM

## 2021-11-12 DIAGNOSIS — I10 Essential (primary) hypertension: Secondary | ICD-10-CM

## 2021-11-12 DIAGNOSIS — E119 Type 2 diabetes mellitus without complications: Secondary | ICD-10-CM

## 2021-11-12 NOTE — Progress Notes
[facility] - HISTORY AND PHYSICAL     PATIENT:  Chad Watkins  MRN:  [id number]  DATE: 11/12/2021    REFERRING PRACTITIONER: Trena Platt., DO  PRIMARY CARE PROVIDER: Trena Platt., DO    Reason for consult:   Chief Complaint   Patient presents with   ? Imbalance         History of Present Illness:  Chad Watkins is a 76 y.o. male who  has a past medical history of Alcohol use disorder (09/15/2021), Aortic atherosclerosis (HCC/RAF) (03/13/2020), Colon polyps-2020 (09/19/2019), Diabetic neuropathy (HCC/RAF) (01/27/2018), Diabetic peripheral vascular disease (HCC/RAF) (09/03/2017), DM type 1 with diabetic mixed hyperlipidemia (HCC/RAF) (09/03/2017), Edema leg (07/14/2018), Glaucoma, History of fracture of left ankle (12/01/2017), Hypertension associated with diabetes (HCC/RAF) (09/03/2017), Long term (current) use of insulin (HCC/RAF) (09/19/2019), Peripheral vascular disease (HCC/RAF) (09/03/2017), Tortuous aorta (HCC/RAF) (03/13/2020), Trigger thumb (01/27/2018), and Type 1 diabetes mellitus with diabetic neuropathy, unspecified (HCC/RAF) (09/03/2017). Patient presents for the evaluation of gait imbalance and episodes of loss consciousness. Patient required Spanish interpretation services - [phone].     Reports he has had four big falls. The first two were described by patient: occurred about 3 months but not exactly sure. States he is not sure about what happened leading to the fall. Was with his family, and he had gone to bathroom for a bowel movement and when he came back he had fallen. Does not remember feel sick around the time of this fall. He did have loss of consciousness for a few minutes, does not report significantly confused/fatigued afterwards. No reported tongue lacerations or incontinence during this event.     The second fall occurred about 2 months ago (1 month after the initial fall). Reports at this time, he wanted to go to the bathroom but was unable to make in time. Was with wife at the time and was told he did not trip on anything. Did feel somewhat lightheaded beforehand and endorses feeling weak. Did not having any loss of consciousness during this episode but was incontinent of both urine and feces. Not sure what led to the fall.     Never had any other fainting spells apart from this first fall. No recent changes in his medications. Does admit to drinking the day before during at least one of these falls, possible both. Admit to drinking occasionally, but not much. Denies feeling lightheaded/dizzy with standing up. Denies waking incontinent in the bed or with tongue lacerations. Does report having difficulty making it to the bathroom in time. Was given prescription for PT given chronic gait impairment, but not yet started. No reported focal weakness or numbness but does feel clumsy in both of his feet.     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     Daughter showed ring camera footage of two other falls (unsure if these are the same falls that he had described):     During the first episode, he was sitting at the kitchen table (July 31st at 12:30 AM) when he gets up to check his blood sugar. Is seen stumbling, then stands steadying himself at the table. Starts stumbling backwards and has fall backwards.     During the second episode (in August), was standing at the kitchen counter preparing lettuce when he suddenly is seen stumbling with fall backwards. Family members go to help him, and they try standing him, and he is seen stumbling again losing  tone and falling backwards. Had to get stitches due to head injury. Does admit to unstable diabetes, with rapid fluctuations in his blood sugars, though he is unsure if the blood sugar was high or low during these episodes.     In both instances, he appears awake. Unclear if he is responsive or altered during the first event. Per daughter, was out of it for about 5 minutes after the second episode.     No major changes with memory or mood. No reported tremors. Did have headaches after these falls.      Patient Active Problem List    Diagnosis Date Noted   ? Alcohol use disorder 09/15/2021   ? Refractive error 02/13/2021     Release spectacle rx     ? Posterior capsular opacification 07/25/2020     Doing well s/p yag capsulotomy right eye on 11/11/2020  Doing well s/p yag capsulotomy left eye on 10/14/2020    F/u with Dr Excell Seltzer as scheduled     ? Ptosis of eyelid 07/25/2020     Consider oculoplastics consult if patient wishes     ? Controlled type 1 diabetes with mild nonproliferative retinopathy (HCC/RAF) 04/24/2020     No diabetic retinopathy seen today.  The importance of tight blood sugar (goal HgA1c <7) and blood pressure (<140/80) was emphasized. Patient understands the need for glucose monitoring/control, compliance with treatment and recommendations, and need for regular dilated fundus examinations, at least annually.     ? Aortic atherosclerosis (HCC/RAF) 03/13/2020   ? Tortuous aorta (HCC/RAF) 03/13/2020   ? Long term (current) use of insulin (HCC/RAF) 09/19/2019   ? Colon polyps-2020 09/19/2019   ? Mild nonproliferative diabetic retinopathy (HCC/RAF) 02/14/2019     None right eye.  Very minimal left eye.  Maintain good blood sugar and blood pressure under direction of diabetes doctor.         ? Pseudophakia 02/14/2019     Multifocal IOLs.  Not happy with near vision, feels worse last year    Optometry consult after yag cap both eyes      ? Phyisological cupping of optic disc, right 02/14/2019     Right nerve is larger than the left.  RNFL OCT 02/14/2019 normal OU.  04/24/2020 normal, both eyes.  -> repeat oct in 1 year.       ? Edema leg 07/14/2018   ? Diabetic neuropathy (HCC/RAF) 01/27/2018   ? Trigger thumb 01/27/2018   ? History of fracture of left ankle 12/01/2017   ? Hypertension associated with diabetes (HCC/RAF) 09/03/2017   ? DM type 1 with diabetic mixed hyperlipidemia (HCC/RAF) 09/03/2017   ? DM (diabetes mellitus), type 1, uncontrolled, periph vascular complic 09/03/2017   ? Peripheral vascular disease (HCC/RAF) 09/03/2017   ? DM (diabetes mellitus), type 1, uncontrolled w/neurologic complication 09/03/2017     IMO 10-19-20 Regulatory Update     ? Left ankle pain 04/09/2017   ? Abnormal EKG 08/23/2012   ? Palpitation 08/18/2012   ? Need for vaccination 08/18/2012   ? Healthcare maintenance 08/18/2012   ? Bursitis 06/05/2011   ? Skin cancer 06/05/2011     Possible, RT, ear         Past Medical History:   Diagnosis Date   ? Alcohol use disorder 09/15/2021   ? Aortic atherosclerosis (HCC/RAF) 03/13/2020   ? Colon polyps-2020 09/19/2019   ? Diabetic neuropathy (HCC/RAF) 01/27/2018   ? Diabetic peripheral vascular disease (HCC/RAF) 09/03/2017   ? DM type 1  with diabetic mixed hyperlipidemia (HCC/RAF) 09/03/2017   ? Edema leg 07/14/2018   ? Glaucoma     Suspect   ? History of fracture of left ankle 12/01/2017   ? Hypertension associated with diabetes (HCC/RAF) 09/03/2017   ? Long term (current) use of insulin (HCC/RAF) 09/19/2019   ? Peripheral vascular disease (HCC/RAF) 09/03/2017   ? Tortuous aorta (HCC/RAF) 03/13/2020   ? Trigger thumb 01/27/2018   ? Type 1 diabetes mellitus with diabetic neuropathy, unspecified (HCC/RAF) 09/03/2017       Medications that the patient states to be currently taking   Medication Sig   ? acetaminophen-codeine (TYLENOL #3) 300-30 mg tablet    ? acetaminophen-codeine (TYLENOL #3) 300-30 mg tablet Take 1-2 tablets by mouth every six (6) hours as needed. Max Daily Amount: 8 tablets   ? AMLODIPINE-BENAZEPRIL 5-20 mg capsule TAKE 1 CAPSULE BY MOUTH TWO (2) TIMES DAILY.   ? cephalexin 500 mg capsule Take 1 capsule (500 mg total) by mouth four (4) times daily.   ? ciclopirox 8% solution Apply topically at bedtime Apply over nail and surrounding skin..   ? FLUZONE HIGH-DOSE 0.5 ML syringe    ? furosemide 20 mg tablet Take 1-2 tablets (20-40 mg total) by mouth daily.   ? gatifloxacin 0.5% ophthalmic solution    ? glucagon (BAQSIMI TWO PACK) 3 mg/dose nasal powder 1 actuation (3 mg total) by Left Nare route every fifteen (15) minutes as needed.   ? glucose blood test strip Use as instructed   ? ibuprofen 600 mg tablet Take 1 tablet (600 mg total) by mouth every six (6) hours as needed.   ? insulin aspart, NovoLOG, (NOVOLOG FLEXPEN) 100 units/mL injection pen Inject 0.02-0.08 mLs (2-8 Units total) under the skin three (3) times daily before meals.   ? insulin glargine (BASAGLAR KWIKPEN) 100 units/mL injection pen Use 6 units daily. Waste 2 units to prime the needle.   ? insulin pen needle (B-D UF NANO PEN NEEDLES) 32G X 4 mm Check BS 6 times daily.  DX Type 1 DM on insulin [E10.1, Z79.4].   ? Insulin Pen Needle (BD AUTOSHIELD DUO) 30G X 5 MM MISC Use to inject insulin up to 6x daily.   ? LOTEMAX SM 0.38 % GEL    ? metFORMIN (GLUCOPHAGE XR) 500 mg PO ER 24 hr tablet Take 2 tablets (1,000 mg total) by mouth two (2) times daily with meals.   ? ONETOUCH ULTRA test strip USE 1 TEST STRIP TESTING 5 TIMES A DAY.   ? PROLENSA 0.07 % SOLN    ? SHINGRIX 50 MCG/0.5ML injection    ? SILDENAFIL 100 mg tablet TOME UNA TABLETA TODOS LOS DIAS CUANDO SEA NECESARIO FOR ERECTILE DYSFUNCTION   ? simvastatin (ZOCOR) 20 mg tablet TOME UNA TABLETA DIARIAMENTE   ? SIMVASTATIN 20 mg tablet TAKE 1 TABLET BY MOUTH EVERYDAY AT BEDTIME   ? simvastatin 20 mg tablet Take 1 tablet (20 mg total) by mouth.         No Known Allergies     Family History   Problem Relation Age of Onset   ? Glaucoma Neg Hx    ? Macular degeneration Neg Hx        Social History     Socioeconomic History   ? Marital status: Married   ? Number of children: 4   Occupational History   ? Occupation: Soil scientist    Tobacco Use   ? Smoking status: Never   ? Smokeless  tobacco: Never   Substance and Sexual Activity   ? Alcohol use: Yes     Alcohol/week: 4.2 oz     Types: 7 Standard drinks or equivalent per week        Review of Systems:  Other than noted above, a full 14-point review of systems was reviewed and is negative for: General, Eyes, ENMT, respiratory, cardiovascular, GI, GU, musculoskeletal, allergy/immunology, endocrinology, hematology, skin, neurologic, and psychiatric.      Objective:      Last Recorded Vital Signs:    11/12/21 0936   BP: 119/60   Pulse: 67   SpO2: 99%        Gen appearance: in NAD, conversational   HEENT; atraumatic head, normocephalic, anicteric sclera, PERRLA  CV: no edema  Resp: breathing comfortably on room air   Skin: No visible lesions or rashes  Psych: Normal affect, conversational     Neuro:  Orientation: patient is alert and oriented to person and situation   Attention: awake and alert; follows commands   Language: non aphasia nor dysarthria  CN 2: PERRLA, no visual field deficits   CN 3, 4, 6: EOMI, eyes conjugate, no nystagmus   CN 5: facial sensation intact   CN 7: no facial droop  CN 8: hearing intact to finger rub   CN 9, 10, 12: symmetric palate elevation; tongue midline   Tone: normal bulk and tone   Strength R/L:    Eye and lip closure   5   Deltoid   5/5   Biceps    5/5   Triceps    5/5   Wrist extension   5/5   Wrist flexion    5/5   FDI    5/5   ABP    5/5   IP    5/5   Hamstring    5/5   Quads    5/5   TA     5/5   Gastroc   5/5   Reflexes: 2+ biceps, brachioradialis, patellar   Movement: no tremor or abnormal movements   Sensation: no sensory deficits to LT   Coordination: no dysmetria on finger-to-nose  Gait: can stand unassisted, unable to tandem, has wide based gait              Lab Results   Component Value Date    WBC 7.75 08/17/2021    HGB 11.1 (L) 08/17/2021    HCT 33.0 (L) 08/17/2021    MCV 88.2 08/17/2021    PLT 315 08/17/2021     Lab Results   Component Value Date    CREAT 0.84 08/17/2021    BUN 17 08/17/2021    NA 129 (L) 08/17/2021    K 5.2 08/17/2021    CL 91 (L) 08/17/2021    CO2 25 08/17/2021     Lab Results   Component Value Date    HGBA1C 8.2 (H) 08/17/2021     Lab Results   Component Value Date    ALT 19 08/17/2021    AST 34 08/17/2021    ALKPHOS 69 08/17/2021    BILITOT 0.4 08/17/2021     Lab Results   Component Value Date    TSH 0.37 08/17/2021     Lab Results   Component Value Date    CALCIUM 10.0 08/17/2021    PHOS 2.8 08/10/2021     Lab Results   Component Value Date    CHOL 173 08/17/2021    CHOLHDL 109 08/17/2021  CHOLDLCAL 55 08/17/2021    CHOLDLQ 80 01/27/2018    TRIGLY 43 08/17/2021        Imaging:  No results found.    Neurologic Data:     Exam: MRI OF BRAIN WITH AND WITHOUT CONTRAST. MRA brain with and without contrast  ?  Clinical History:  Focal neurologic deficit, falls, possible aneurysm   ?  Comparison: CT brain dated 08/10/2021.  ?  Technique: Multiplanar, multisequence images of the brain with and without IV contrast and MRA brain with and without contrast are reviewed.  ?  CONTRAST:  6.58ml of gadobutrol (Gadavist) 1 mmol/mL inj 7.5 mL, administered intravenously without complications.  ?  Findings:  ?  MRI brain:  ?  There are no areas of restricted diffusion. There are no abnormal susceptibility artifacts.  ?  A few scattered foci of periventricular and subcortical white matter T2/FLAIR hyperintensities consistent with minimal chronic microvascular ischemic disease.  ?  There is a small focus of T2 hyperintensity within the right medial cerebellum, likely gliosis.   ?  There is no evidence of mass, hemorrhage, infarct, midline shift or abnormal intraaxial fluid collection. The major intracranial blood vessels retain normal flow voids consistent with their patency.  ?  There is minimal mucosal thickening within the left maxillary sinus. The mastoid air cells are essentially clear.  ?  MRA brain:  The anterior, middle and posterior cerebral artery, vertebral arteries and the basilar artery are patent without evidence of high-grade stenosis or occlusion.    ?  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 Additionally there is a 2 mm left M1 aneurysm (2-82). The branch of the left M1 that supplies the superior division MCA territory originates at the neck of this aneurysm.  ?  There is a 1 mm infundibulum at the right anterior choroidal artery. (2-64)  ?  ?  IMPRESSION:  ?  Brain MRI:  ?  Focus of right cerebellar para vermian gliosis. A few white matter T2 hyperintensities, nonspecific though can reflect minimal chronic small vessel ischemic changes.  ?  Brain MRA:   ?  1. 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   2. 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 A branch of the left M1 that supplies the superior division MCA territory originates at the neck of this aneurysm.   3. No areas of significant stenosis or occlusion.  ?  ?  I, Pauline Aus, M.D., have reviewed this radiological study personally and I am in full agreement with the findings of the report presented here.  ?  Dictated by: Lucretia Kern   11/05/2021 10:53 AM  ?  Signed by: Pauline Aus   11/05/2021 5:28 PM      Assessment:      Chad Watkins is a 76 y.o. male with history of recurrent falls, gait imbalance, and lapses in consciousness of unclear etiology. From video footage reviewed, patient is seen going from sitting to standing and stumbling somewhat before he completely loses his balance during one episodes, suggesting possibly a positional component. During the 2nd video, patient is already standing when he has sudden retropulsion leading to fall, and then when stood up appears to lose tone and fall back down to the ground. It is unclear if he was altered or confused during this first episodes as he was alone. Per daughter, he was somewhat confused for about 5 minutes with the second episode. Of note, he endorses having uncontrolled, insulin dependent  diabetes and is unsure what his blood sugar was at the time of these episodes.    MRI brain was reviewed, and there was a small area of cerebellar gliosis which could theoretically cause the clumsiness and retropulsion, but would not explain the lapses in consciousness. MRA did show small aneurysms, which he reportedly had on prior studies and does not explain these events. He has not had cardiological evaluation or has any known history of arrhythmia, but another consideration would be cardiac syncope caused by an arrhythmia or heart block and so referral to cardiology was given.    Will get EEG brain to assess for possible seizure-like activity, as well as carotid duplex as MRA did not visualize the vasculature of the neck. We also discussed fall precautions, and getting up slowly, slow and steady steps, and avoiding impulsive movements to prevent falling. We discussed that if he does feel faint, to gently lower himself to the ground. If these episodes were to recur, family was asked to check his blood sugar and blood pressure, and perform some orienting questions to see how responsive he is during an episodes. We also discussed initiating PT for gait and balance training.     Spanish translator was used. MRI and MRA brain was personally reviewed by me with patient and family, and I agree with radiology report.     No diagnosis found.  Plan/ Recommendation:     - EEG given some seizure-like activity with the episodes, apparent confusion   - Carotid Duplex   - Cardiology referral for cardiac monitor, cardiac etiology to syncopal events   - PT for gait and balance training  - RTC 1 month for follow-up    The above recommendation were discussed with the patient.  The patient had all questions answered satisfactorily and is in agreement with this recommended     Thank you for asking me to participate in the care of this patient.    Total minutes spent today including review of tests/notes, obtaining history, performing exam, counseling/educating patient/family, ordering tests/medications, communicating with other physicians, charting, independently interpreting results and communicating test results to patient/family =  71 minutes    Author: Lucienne Minks A. Coe Angelos 11/12/2021 9:45 AM               Department of Neurology      [facility]

## 2021-11-13 ENCOUNTER — Inpatient Hospital Stay: Payer: BLUE CROSS/BLUE SHIELD | Attending: Neurology

## 2021-11-13 DIAGNOSIS — R55 Syncope and collapse: Secondary | ICD-10-CM

## 2021-11-21 ENCOUNTER — Ambulatory Visit: Payer: BLUE CROSS/BLUE SHIELD | Attending: Neurology

## 2021-11-25 ENCOUNTER — Ambulatory Visit: Payer: BLUE CROSS/BLUE SHIELD

## 2021-11-30 MED ORDER — SIMVASTATIN 20 MG PO TABS
ORAL_TABLET | 2 refills
Start: 2021-11-30 — End: ?

## 2021-12-02 ENCOUNTER — Ambulatory Visit: Payer: BLUE CROSS/BLUE SHIELD | Attending: Cardiovascular Disease

## 2021-12-02 DIAGNOSIS — R55 Syncope and collapse: Secondary | ICD-10-CM

## 2021-12-02 NOTE — Consults
CARDIOLOGY CONSULT NOTE    DATE OF SERVICE: 12/02/2021    REFERRING PHYSICIAN: Trena Platt., DO    REASON FOR CONSULT: Syncope    HISTORY OF PRESENT ILLNESS: This is a 76 y.o. male with history of DM, HTN, EtOH use here for evaluation of falls and possible syncope. Pt reports that he had two events where he fell. Most recent event around Aug 2023. He went to the bathroom and when he came out of the bathroom he fell. He felt ''tiredness in the head''. He denies dizziness. He does not remember falling, had LOC, and remembers waking up on the floor. Daughter reports that on another episode he was shredding lettuce in the kitchen, lost strength in hs legs and fell. On another episodes he fell backwards and hit the back of his head. During another episode recorded on a Ring camera he stumbled back a few steps before falling. During another witnessed episode (also documented on a Ring camera) he quickly fell and daughter reports that he was out of it but she was able to help him stand up again briefly and then he fell again, was seated in a chair and quickly was able to talk and converse and back to himself. He denies any associated palps. He does not do any exercise. He denies CP or SOB with exercise. Family has checked his glucometer during these falls and it was ''normal''      PAST MEDICAL HISTORY:   1. DM  2. HTN  3. EtOH use  4. Brain Aneurysm    Past Surgical History:   Procedure Laterality Date   ? APPENDECTOMY     ? YAG CAPSULOTOMY Left 10/14/2020   ? YAG CAPSULOTOMY Right 11/11/2020       ALLERGIES: No Known Allergies    CURRENT MEDICATIONS:   Current Outpatient Medications   Medication Sig   ? acetaminophen-codeine (TYLENOL #3) 300-30 mg tablet    ? acetaminophen-codeine (TYLENOL #3) 300-30 mg tablet Take 1-2 tablets by mouth every six (6) hours as needed. Max Daily Amount: 8 tablets   ? AMLODIPINE-BENAZEPRIL 5-20 mg capsule TAKE 1 CAPSULE BY MOUTH TWO (2) TIMES DAILY.   ? cephalexin 500 mg capsule Take 1 capsule (500 mg total) by mouth four (4) times daily.   ? ciclopirox 8% solution Apply topically at bedtime Apply over nail and surrounding skin..   ? FLUZONE HIGH-DOSE 0.5 ML syringe    ? furosemide 20 mg tablet Take 1-2 tablets (20-40 mg total) by mouth daily.   ? gatifloxacin 0.5% ophthalmic solution    ? glucagon (BAQSIMI TWO PACK) 3 mg/dose nasal powder 1 actuation (3 mg total) by Left Nare route every fifteen (15) minutes as needed.   ? glucose blood test strip Use as instructed   ? ibuprofen 600 mg tablet Take 1 tablet (600 mg total) by mouth every six (6) hours as needed.   ? insulin aspart, NovoLOG, (NOVOLOG FLEXPEN) 100 units/mL injection pen Inject 0.02-0.08 mLs (2-8 Units total) under the skin three (3) times daily before meals.   ? insulin glargine (BASAGLAR KWIKPEN) 100 units/mL injection pen Use 6 units daily. Waste 2 units to prime the needle.   ? insulin pen needle (B-D UF NANO PEN NEEDLES) 32G X 4 mm Check BS 6 times daily.  DX Type 1 DM on insulin [E10.1, Z79.4].   ? Insulin Pen Needle (BD AUTOSHIELD DUO) 30G X 5 MM MISC Use to inject insulin up to 6x daily.   ? LOTEMAX  SM 0.38 % GEL    ? metFORMIN (GLUCOPHAGE XR) 500 mg PO ER 24 hr tablet Take 2 tablets (1,000 mg total) by mouth two (2) times daily with meals.   ? ONETOUCH ULTRA test strip USE 1 TEST STRIP TESTING 5 TIMES A DAY.   ? PROLENSA 0.07 % SOLN    ? SHINGRIX 50 MCG/0.5ML injection    ? SILDENAFIL 100 mg tablet TOME UNA TABLETA TODOS LOS DIAS CUANDO SEA NECESARIO FOR ERECTILE DYSFUNCTION   ? simvastatin (ZOCOR) 20 mg tablet TOME UNA TABLETA DIARIAMENTE   ? SIMVASTATIN 20 mg tablet TAKE 1 TABLET BY MOUTH EVERYDAY AT BEDTIME   ? simvastatin 20 mg tablet Take 1 tablet (20 mg total) by mouth.     No current facility-administered medications for this visit.       SOCIAL HISTORY:   Social History     Tobacco Use   ? Smoking status: Never   ? Smokeless tobacco: Never   Substance Use Topics   ? Alcohol use: Yes     Alcohol/week: 4.2 oz     Types: 7 Standard drinks or equivalent per week   Married, works in a market cooking    FAMILY HISTORY:  Family History   Problem Relation Age of Onset   ? Glaucoma Neg Hx    ? Macular degeneration Neg Hx        REVIEW OF SYSTEMS:  A complete 14 system review of systems was performed and was negative exception of those items mentioned in the HPI past medical history.    PHYSICAL EXAM:  BP 124/60  ~ Pulse 67  ~ Wt 146 lb 6.4 oz (66.4 kg)  ~ SpO2 96%  ~ BMI 24.36 kg/m?   General: The patient is well developed, well nourished and in no apparent distress.  The patient is cooperative with the physical examination and is pleasant during evaluation.    Psychiatric: Alert and oriented.  Affect is appropriate.    Head: Normocephalic, atraumatic.    Neck: Supple with full range of motion.  No masses or thyromegaly.  JVP is flat.    ENT: Sclerae are anicteric.  Oral mucosa is clear.  No epistaxis.    Cardiac: PMI is not displaced on palpation.  Normal S1-S2.  Heart is regular in rate and rhythm.  No murmur.  No rubs, gallops or thrills.    Vascular: No carotid bruits.  Radial pulses are 2+ and symmetric.  Pedal pulses are symmetric.  No femoral artery bruits.   Lungs: Breathing is non-labored.  No use of accessory muscles.  Lungs are clear to auscultation bilaterally without wheeze, rales, or rhonchi.    Abdomen: Bowel sounds are present.  Soft, nontender and nondistended.  No hepatomegaly appreciated.  No pulsatile mass.    Extremities: No cyanosis, clubbing or edema.    Skin: No visible rash or ulceration.    Neuro: No focal deficits.  Strength is 5/5 in all four extremities.    DIAGNOSTIC STUDIES:    ECG dated 12/02/2021 was personally reviewed and demonstrates NSR @ 63 BPM, early repolarization    Lab Results   Component Value Date    WBC 7.75 08/17/2021    HGB 11.1 (L) 08/17/2021    HCT 33.0 (L) 08/17/2021    PLT 315 08/17/2021    CREAT 0.84 08/17/2021    BUN 17 08/17/2021    NA 129 (L) 08/17/2021    K 5.2 08/17/2021    CL 91 (L) 08/17/2021  CO2 25 08/17/2021    CHOL 173 08/17/2021    CHOLDLCAL 55 08/17/2021    CHOLHDL 109 08/17/2021    TRIGLY 43 08/17/2021    TROPONIN 8 (H) 08/10/2021    CKTOT 295 02/13/2020    INR 1.0 08/10/2021    BNP 161 (H) 08/10/2021    HGBA1C 8.2 (H) 08/17/2021    TSH 0.37 08/17/2021     IMAGING:     08/10/21 CXR - Normal cardiomediastinal silhouette. Calcified and mildly tortuous thoracic aorta.  Normal lung volumes. No consolidation. Mild bronchial wall thickening. Left basilar atelectasis.  No pleural effusions. No pneumothorax. Patchy opacification over the lower thoracic vertebral bodies seen only on lateral view, may represent a focal infectious/inflammatory process, likely in the left lung base. No focal dense lung consolidation.  Degenerative changes within the spine.    11/01/21 Brain MRI -  1. 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   2. 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 A branch of the left M1 that supplies the superior division MCA territory originates at the neck of this aneurysm.   3. No areas of significant stenosis or occlusion.    No results found.        ASSESSMENT: 76 y.o. male with history of DM, HTN, EtOH use here for evaluation of falls and possible syncope    PLAN:    1. Syncope/Falls -  Unclear etiology  No post-ictal state by history and by review of the Ring camera videos  Send for Echo, TM Myoview, Ziopatch to help r/o cardiac etiology  Query to what degree alcohol playing a role?  Orthostatics negative on today's exam  Daughter denies hypoglycemia as a cause    2. HTN -  BP controlled    3. DM -  Mgmt as per PCP    F/u after above    Thank you again for including me in the care of [facility] .    Sincerely,    Billy Fischer, MD  [facility]  [address] [place], [address]  [place], [place] [address]  T - [phone]  F - [phone]

## 2021-12-02 NOTE — Patient Instructions
Please wear the Zio Patch for 2 weeks and then mail it back. It takes roughly 10 days after you put it in the mail for me to get the results.    Please schedule Echocardiogram and Stress Test

## 2021-12-05 ENCOUNTER — Inpatient Hospital Stay: Payer: BLUE CROSS/BLUE SHIELD | Attending: Neurology

## 2021-12-05 DIAGNOSIS — R569 Unspecified convulsions: Secondary | ICD-10-CM

## 2021-12-05 DIAGNOSIS — R55 Syncope and collapse: Secondary | ICD-10-CM

## 2021-12-08 ENCOUNTER — Institutional Professional Consult (permissible substitution): Payer: BLUE CROSS/BLUE SHIELD

## 2021-12-08 DIAGNOSIS — R55 Syncope and collapse: Secondary | ICD-10-CM

## 2021-12-08 NOTE — Procedures
ZIO XT    HOME DELIVERY     How many days#   14    Reading Physician:  Karl Ito

## 2021-12-09 MED ORDER — SIMVASTATIN 20 MG PO TABS
ORAL_TABLET | 2 refills | Status: AC
Start: 2021-12-09 — End: ?

## 2021-12-13 MED ORDER — SILDENAFIL CITRATE 100 MG PO TABS
ORAL_TABLET | 10 refills
Start: 2021-12-13 — End: ?

## 2021-12-23 MED ORDER — SILDENAFIL CITRATE 100 MG PO TABS
50-100 mg | ORAL_TABLET | Freq: Every day | ORAL | 5 refills | Status: AC | PRN
Start: 2021-12-23 — End: ?

## 2021-12-29 NOTE — Progress Notes
PHYSICAL THERAPY EVALUATION     PATIENT: Chad Watkins  GENDER: male  AGE: 76 y.o.  DOB: 1945-07-01  MRN: [id number]    REFERRING PHYSICIAN: Trena Platt., DO    DATE OF ONSET: 09/15/21  (date of physician referral)    -Patient requires spanish Language Interpreter   -Accessed Interpreter  -Phone: [phone]  -Access Code# [id number]  -[place] [facility]: [facility] [place]  -Reference # [id number]  -Interpreter's name: Elenor Quinones    DIAGNOSIS:    ICD-10-CM    1. Balance problem  R26.89           Subjective:   CHIEF COMPLAINT:  Chief Complaint   Patient presents with    Balance Problem       HISTORY OF PRESENT ILLNESS / INJURY:   Patient reports that they have been experiencing stability and balance issues with history of multiple falls since 2021 with gradual chronic onset. He denies dizziness when falling, but does get dizziness when standing too quickly. He does report that when he is standing sometimes he gets a weird feeling into his feet, but it is hard to explain. He stands a lot at work, but has had about 2 instances where he feels like his feet are falling asleep and he has tripped. Does not affect him when walking, more when standing in one place or changing positions.     A thorough history of falls can be seen below:    Dr. Shawnee Knapp at [facility] provided an in depth history of recent falls and circumstances surrounding these falls. Per physician's note from 11/12/21:  ''The first two were described by patient: occurred about 3 months but not exactly sure. States he is not sure about what happened leading to the fall. Was with his family, and he had gone to bathroom for a bowel movement and when he came back he had fallen. Does not remember feel sick around the time of this fall. He did have loss of consciousness for a few minutes, does not report significantly confused/fatigued afterwards. No reported tongue lacerations or incontinence during this event.      The second fall occurred about 2 months ago (1 month after the initial fall). Reports at this time, he wanted to go to the bathroom but was unable to make in time. Was with wife at the time and was told he did not trip on anything. Did feel somewhat lightheaded beforehand and endorses feeling weak. Did not having any loss of consciousness during this episode but was incontinent of both urine and feces. Not sure what led to the fall.      Never had any other fainting spells apart from this first fall. No recent changes in his medications. Does admit to drinking the day before during at least one of these falls, possible both. Admit to drinking occasionally, but not much. Denies feeling lightheaded/dizzy with standing up. Denies waking incontinent in the bed or with tongue lacerations. Does report having difficulty making it to the bathroom in time. Was given prescription for PT given chronic gait impairment, but not yet started. No reported focal weakness or numbness but does feel clumsy in both of his feet.      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      Daughter showed ring camera footage of two other falls (unsure if these are the same falls that he had described):      During the first  episode, he was sitting at the kitchen table (July 31st at 12:30 AM) when he gets up to check his blood sugar. Is seen stumbling, then stands steadying himself at the table. Starts stumbling backwards and has fall backwards.      During the second episode (in August), was standing at the kitchen counter preparing lettuce when he suddenly is seen stumbling with fall backwards. Family members go to help him, and they try standing him, and he is seen stumbling again losing tone and falling backwards. Had to get stitches due to head injury. Does admit to unstable diabetes, with rapid fluctuations in his blood sugars, though he is unsure if the blood sugar was high or low during these episodes.      In both instances, he appears awake. Unclear if he is responsive or altered during the first event. Per daughter, was out of it for about 5 minutes after the second episode.''    [name]- patient's daughter is present throughout    PAIN:   Not at physical therapy for pain    PRIOR INTERVENTIONS:   none  (+) indicates helpful  (-) indicates not helpful    BARRIERS TO LEARNING:  Are there any cultural or religious beliefs limiting treatment?: No      Are there any barriers to learning?: Yes  If yes, comments:: english as second language- spanish primary language  Are there any special communication needs?: Yes  If yes, comments:: english as second language- spanish primary language          History:   PROBLEM SUMMARY LIST:   Patient Active Problem List   Diagnosis    Bursitis    Skin cancer    Palpitation    Need for vaccination    Healthcare maintenance    Abnormal EKG    Left ankle pain    Hypertension associated with diabetes (HCC/RAF)    DM type 1 with diabetic mixed hyperlipidemia (HCC/RAF)    DM (diabetes mellitus), type 1, uncontrolled, periph vascular complic    Peripheral vascular disease (HCC/RAF)    DM (diabetes mellitus), type 1, uncontrolled w/neurologic complication    History of fracture of left ankle    Diabetic neuropathy (HCC/RAF)    Trigger thumb    Edema leg    Mild nonproliferative diabetic retinopathy (HCC/RAF)    Pseudophakia    Phyisological cupping of optic disc, right    Long term (current) use of insulin (HCC/RAF)    Colon polyps-2020    Aortic atherosclerosis (HCC/RAF)    Tortuous aorta (HCC/RAF)    Controlled type 1 diabetes with mild nonproliferative retinopathy (HCC/RAF)    Posterior capsular opacification    Ptosis of eyelid    Refractive error    Alcohol use disorder    Balance problem       SURGICAL HISTORY:  Past Surgical History:   Procedure Laterality Date    APPENDECTOMY      YAG CAPSULOTOMY Left 10/14/2020    YAG CAPSULOTOMY Right 11/11/2020        PERTINENT DIAGNOSTIC TESTS: none on file    PRECAUTIONS:  Dizziness/lightheadedness, history of fainting  6 falls in the past year; sometimes out of nowhere, sometimes when standing up too quickly- paramedics were called 3 times, one resulting in needing 8 staples to the head  HTN, type 1 diabetes, peripheral vascular disease    RECENT INFECTIONS:  No    OCCUPATION: service deli clerk    LIVING SITUATION:  Lives with another/others who  can provide assistance  Stairs: 20    FALLS ASSESSMENT:  Have you fallen in the last 12 months?: Yes         Did the fall(s) result in an injury?: Yes     Follow-up Required?: No (already been following up with the needed physicians)             PATIENT OWNED EQUIPMENT:  Single point cane    PATIENT'S GOAL: ''Better balance''    Objective:     12/30/2021    BALANCE/GAIT     POSTURE/OBSERVATION/VISUAL INSPECTION:   Rounded shoulders bilaterally    GAIT ANALYSIS:   Shuffling gait    BALANCE:    At Evaluation  12/30/2021   Single Leg Stance 5sec Right  3sec Left   Tandem stance eyes open 6sec Right in front  14sec Left in front   5 time sit to stand 12.2 seconds   Timed Up and Go 9.33 seconds   Dynamic Gait Index 21/24     FUNCTIONAL MOVEMENT ANALYSIS:  Stairs: Single upper extremity support needed    Sit to Stand: right leg posterior    SENSATION:  Not tested    STRENGTH:  Hip flexion: right 4+/5, left 4+/5  Hip extension: right 4+/5, left 4+/5  Hip abduction: right 4+/5, left 4+/5  Hip adduction: right 4+/5, left 4+/5  Knee flexion: right 4+/5, left 4+/5  Knee extension: right 4+/5, left 4+/5  Dorsiflexion: right 4/5, left 4+/5  Plantarflexion: right 4+/5, left 4+/5  -----------------------------------------------------------------------------------------------------------------------------------    PRIOR LEVEL OF FUNCTION:   Independent and fully functional with work duties, prolonged standing, sit to stand    CURRENT LEVEL OF FUNCTION:   12/30/2021  Sudden fainting and falls happening with prolonged standing, sit to stand, work duties    -------------------------------------------------------------------------------------       PROBLEM LIST:    lacking a home exercise program, , lacking safety awareness, and decreased single leg balance    OUTCOME MEASURES:   12/30/2021                      12/29/2021    11:15 AM   _   Activities-specific Balance Confidence Scale (ABC) % confidence 56.88 (Moderate level of physical functioning)   - 80% = high level of physical functioning  - 50-80% = moderate level of physical functioning  - <67% = older adults at risk for falling; predictive of future fall   - <50%= low level of physical functioning     *FOR ALL GOALS: M = goal met, IP = goal in progress*  SHORT TERM GOALS: 5 visits  Patient will participate in home program management of deficits contributing to impaired balance, falls, and weakness  including exercise, balance drills, and strategies to compensate for sensory problems.   IP  Patient will perform tandem stance without upper extremity support for >10 seconds on each side to demonstrate improved functional balance and decreased fall risk, resulting in improved safety in the community. IP  Patient will be able to walk on even ground outside for 1 mile with symmetrical gait pattern without assistive device and without significant pain or compensations to return to prior level of community function IP  Patient will ascend/descend 12 stairs with reciprocal gait pattern, one hand upper extremity support, and no compensations to return to prior level of household and community function. IP    LONG TERM GOALS: 10 visits   Patient will be independent with home program management of  deficits contributing to impaired balance, falls, and weakness includingexercise, balance drills, and strategies to compensate for sensory problems. IP  Patient will perform Timed Up and Go (TUG) test in 9 seconds or less to demonstrate improved functional balance and decreased fall risk, resulting in improved safety in the community. IP  Patient will perform single leg stance without upper extremity support for >20 seconds on each side to demonstrate improved functional balance and decreased fall risk, resulting in improved safety in the community. IP  Patient will perform tandem stance without upper extremity support for >30 seconds on each side to demonstrate improved functional balance and decreased fall risk, resulting in improved safety in the community. IP  Patient will score greater than or equal to 22/24 on the Dynamic Gait Index to demonstrate improved functional balance and decreased fall risk, resulting in improved safety in the community. IP  Patient will demonstrate an Activities-Specific Balance Confidence Scale (ABC) score >80% to demonstrate improved functional balance and decreased fall risk, resulting in improved safety in the community IP    ASSESSMENT:  Patient presents with stability and balance issues with history of multiple falls since 2021 with gradual chronic onset. The patient's primary impairment(s) include decreased strength  and impaired balance  which is limiting their ability to change positions, complete work activities, and perform a fitness routine. The patient requires skilled therapy that can be safely and effective performed only by a qualified therapist to address the above deficits.    REHAB POTENTIAL: Fair    OBSTACLES TO REHABILITATION/CO-MORBIDITIES THAT MAY AFFECT GOALS AND TREATMENT PLAN: unknown cause for recent falls    Plan:   INTERVENTIONS:   Therapeutic Exercise, Manual Therapy, Neuromuscular Re-education, Therapeutic Activities, Ultrasound, Traction, Electrical Stimulation, Iontophoresis, Heat Therapy, Cold Therapy, Patient/Caregiver Education, Self Care Training, Gait Training, Light Therapy    All portions of the treatment plan may be delegated to the PTA with the exception of spinal joint mobilizations and grade IV and above peripheral joint mobilizations.    Patient to be seen 1-2 times per week for 10 visits    Patient agrees with goals and treatment plan as outlined above:Yes     -    ------------------------------------------------------------------------------------    TREATMENT TODAY:    Diagnosis:   Encounter Diagnoses   Name Primary?    Balance problem Yes     PPO:    Visit number: 1 of 10  Plan of Care Expires: 03/24/2022  Authorization expires:  Medical necessity     PRECAUTIONS: Dizziness/lightheadedness, history of fainting, HTN, type 1 diabetes, peripheral vascular disease    SUBJECTIVE: see evaluation above    OBJECTIVE: see evaluation above    HOME EXERCISE PROGRAM:  Access Code: [email or web address]    THERAPEUTIC EXERCISE / NEURO-MUSCULAR RE-EDUCATION:      Exercise Volume Notes   Range of Motion                           Stretching                     Strengthening                                       Balance / Neuromuscular Re-Education   x Tandem balance 2x30''    x Single leg stance balance 2x30''    x Wobbleboard anterior/posterior  x10ea  Cardiopulmonary               X: performed today  DC: discharged from in-clinic and home exercise program  NV: plan to perform at a future visit      PATIENT EDUCATION:   Education Content: examination findings, plan of care, home exercise program   Education Delivery Method: verbal, written, and demonstration   Education Response: verbalizes understanding and demonstrates understanding    ASSESSMENT:   Patient was able to perform exercises as noted in objective section above with moderate verbal, visual, and tactile cues which demonstrates good understanding and ability to participate in home exercise program.     Patient continues to demonstrate limitations with functional activities of standing, work duties, positional changes, sit to stand, and needs continued skilled physical therapy for return to prior level of independence with household activities, independent community function, work function, and participation in recreational activities     PLAN:   Continue to progress plan of care as tolerated.  Add: balance challenges, incorporating head movements and positional changes    PTA Communication: All portions of the treatment plan may be delegated to the PTA with the exception of spinal joint mobilizations and grade IV and above peripheral joint mobilizations.     Patient was seen for a total of 48 minutes of treatment time.  16 minutes was in services represented by timed CPT codes.    Estefana Taylor N. Poonam Woehrle, DPT

## 2021-12-30 ENCOUNTER — Ambulatory Visit: Payer: BLUE CROSS/BLUE SHIELD

## 2021-12-30 DIAGNOSIS — R2689 Other abnormalities of gait and mobility: Secondary | ICD-10-CM

## 2022-01-01 ENCOUNTER — Ambulatory Visit: Payer: BLUE CROSS/BLUE SHIELD

## 2022-01-01 ENCOUNTER — Ambulatory Visit: Payer: BLUE CROSS/BLUE SHIELD | Attending: Cardiovascular Disease

## 2022-01-02 MED ADMIN — TECHNETIUM TC 99M TETROFOSMIN IV KIT: 8 | INTRAVENOUS | @ 17:00:00 | Stop: 2022-01-02

## 2022-01-02 MED ADMIN — TECHNETIUM TC 99M TETROFOSMIN IV KIT: 25 | INTRAVENOUS | @ 19:00:00 | Stop: 2022-01-02

## 2022-01-06 ENCOUNTER — Ambulatory Visit: Payer: BLUE CROSS/BLUE SHIELD

## 2022-01-06 NOTE — Treatment Summary
PHYSICAL THERAPY TREATMENT NOTE    PATIENT: Chad Watkins  GENDER: male  AGE: 76 y.o.  DOB: 04/23/1945  MRN: [id number]    Visit Number: 2 of 10  Plan of Care Expires: 03/24/2022  Authorization expires:  Medical necessity     PRECAUTIONS: Dizziness/lightheadedness, history of fainting, HTN, type 1 diabetes, peripheral vascular disease    Diagnosis:   Encounter Diagnoses   Name Primary?    Balance problem Yes         SUBJECTIVE:   Patient reports that he does his exercises, but does need to hold on. He did get some foot pedals and can adjust the resistance.     Pain: not at physical therapy for pain    OBJECTIVE:    HOME EXERCISE PROGRAM:  Access Code: [email or web address]    THERAPEUTIC EXERCISE / NEURO-MUSCULAR RE-EDUCATION:       Exercise Volume Notes   Range of Motion                           Stretching                     Strengthening                                       Balance / Neuromuscular Re-Education   x Tandem balance 2x30''    x Single leg stance balance 2x30''    x Wobbleboard anterior/posterior  x10ea    x Dynadisc marching x10ea    x Wobbleboard anterior/posterior staggered stance x15ea    x Wobbleboard  lateral  x15ea    x Wobbleboard surfer balance x1'    x Tandem walking forward/backward 6x6'                      Cardiopulmonary               X: performed today  DC: discharged from in-clinic and home exercise program  NV: plan to perform at a future visit      PATIENT EDUCATION:   Education Content: continued plan of care, reasoning for performance of exercises and other treatment, home exercise program (+ tandem walking )   Education Delivery Method: verbal, written, and demonstration   Education Response: verbalizes understanding and demonstrates understanding    ASSESSMENT:   Changes to impairments and function include no significant changes since initial evaluation.   Patient performed new neuromuscular re-education activities of tandem walking, dynadisc marching, wobbleboard activities with the goal of improving overall balance for improved overall stability. They demonstrated good understanding and verbalized understanding with minimal verbal, visual, and tactile cues.   Patient's home exercise program was modified to include exercises performed today in clinic.    Patient continues to demonstrate limitations with functional activities of standing, work duties, positional changes, sit to stand, and needs continued skilled physical therapy for return to prior level of independence with household activities, independent community function, work function, and participation in recreational activities     PLAN:   Continue to progress plan of care as tolerated.  Add: balance challenges, incorporating head movements and positional changes  Progress:     PTA Communication: All portions of the treatment plan may be delegated to the PTA with the exception of spinal joint mobilizations and grade IV and above peripheral joint mobilizations.  Total Treatment time (minutes) Total time represented by timed CPT codes (minutes)   29  27        Corvin Sorbo N. Trestan Vahle, DPT

## 2022-01-07 ENCOUNTER — Ambulatory Visit: Payer: BLUE CROSS/BLUE SHIELD | Attending: Neurology

## 2022-01-07 DIAGNOSIS — E785 Hyperlipidemia, unspecified: Secondary | ICD-10-CM

## 2022-01-07 DIAGNOSIS — R569 Unspecified convulsions: Secondary | ICD-10-CM

## 2022-01-07 DIAGNOSIS — R55 Syncope and collapse: Secondary | ICD-10-CM

## 2022-01-07 DIAGNOSIS — Z794 Long term (current) use of insulin: Secondary | ICD-10-CM

## 2022-01-07 DIAGNOSIS — E119 Type 2 diabetes mellitus without complications: Secondary | ICD-10-CM

## 2022-01-07 DIAGNOSIS — G119 Hereditary ataxia, unspecified: Secondary | ICD-10-CM

## 2022-01-07 DIAGNOSIS — I10 Essential (primary) hypertension: Secondary | ICD-10-CM

## 2022-01-07 NOTE — Progress Notes
[facility] - HISTORY AND PHYSICAL     PATIENT:  Chad Watkins  MRN:  [id number]  DATE: 01/07/2022    REFERRING PRACTITIONER: No ref. provider found  PRIMARY CARE PROVIDER: Trena Platt., DO    Reason for consult:   Chief Complaint   Patient presents with    Follow-up         History of Present Illness:  Chad Watkins is a 76 y.o. male who  has a past medical history of Alcohol use disorder (09/15/2021), Aortic atherosclerosis (HCC/RAF) (03/13/2020), Colon polyps-2020 (09/19/2019), Diabetic neuropathy (HCC/RAF) (01/27/2018), Diabetic peripheral vascular disease (HCC/RAF) (09/03/2017), DM type 1 with diabetic mixed hyperlipidemia (HCC/RAF) (09/03/2017), Edema leg (07/14/2018), Glaucoma, History of fracture of left ankle (12/01/2017), Hypertension associated with diabetes (HCC/RAF) (09/03/2017), Long term (current) use of insulin (HCC/RAF) (09/19/2019), Peripheral vascular disease (HCC/RAF) (09/03/2017), Tortuous aorta (HCC/RAF) (03/13/2020), Trigger thumb (01/27/2018), and Type 1 diabetes mellitus with diabetic neuropathy, unspecified (HCC/RAF) (09/03/2017). Patient presents for the evaluation of gait imbalance and episodes of loss consciousness. Patient required Spanish interpretation services - [phone].     Reports he has had four big falls. The first two were described by patient: occurred about 3 months but not exactly sure. States he is not sure about what happened leading to the fall. Was with his family, and he had gone to bathroom for a bowel movement and when he came back he had fallen. Does not remember feel sick around the time of this fall. He did have loss of consciousness for a few minutes, does not report significantly confused/fatigued afterwards. No reported tongue lacerations or incontinence during this event.     The second fall occurred about 2 months ago (1 month after the initial fall). Reports at this time, he wanted to go to the bathroom but was unable to make in time. Was with wife at the time and was told he did not trip on anything. Did feel somewhat lightheaded beforehand and endorses feeling weak. Did not having any loss of consciousness during this episode but was incontinent of both urine and feces. Not sure what led to the fall.     Never had any other fainting spells apart from this first fall. No recent changes in his medications. Does admit to drinking the day before during at least one of these falls, possible both. Admit to drinking occasionally, but not much. Denies feeling lightheaded/dizzy with standing up. Denies waking incontinent in the bed or with tongue lacerations. Does report having difficulty making it to the bathroom in time. Was given prescription for PT given chronic gait impairment, but not yet started. No reported focal weakness or numbness but does feel clumsy in both of his feet.     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     Daughter showed ring camera footage of two other falls (unsure if these are the same falls that he had described):     During the first episode, he was sitting at the kitchen table (July 31st at 12:30 AM) when he gets up to check his blood sugar. Is seen stumbling, then stands steadying himself at the table. Starts stumbling backwards and has fall backwards.     During the second episode (in August), was standing at the kitchen counter preparing lettuce when he suddenly is seen stumbling with fall backwards. Family members go to help him, and they try standing him, and he is seen stumbling again losing  tone and falling backwards. Had to get stitches due to head injury. Does admit to unstable diabetes, with rapid fluctuations in his blood sugars, though he is unsure if the blood sugar was high or low during these episodes.     In both instances, he appears awake. Unclear if he is responsive or altered during the first event. Per daughter, was out of it for about 5 minutes after the second episode.     No major changes with memory or mood. No reported tremors. Did have headaches after these falls.    Interval History:    01/07/2022: patient presents for follow-up. Reports since last visit, had seen cardiologist and thus far has had normal stress test. No reported recurrence of syncopal events. Started PT which has been going well. Has been monitoring his blood sugar. Will intermittently feel lightheaded/dizzy with changing positions but last only few seconds to <1 minute.       Patient Active Problem List    Diagnosis Date Noted    Balance problem 12/30/2021    Alcohol use disorder 09/15/2021    Refractive error 02/13/2021     Release spectacle rx      Posterior capsular opacification 07/25/2020     Doing well s/p yag capsulotomy right eye on 11/11/2020  Doing well s/p yag capsulotomy left eye on 10/14/2020    F/u with Dr Excell Seltzer as scheduled      Ptosis of eyelid 07/25/2020     Consider oculoplastics consult if patient wishes      Controlled type 1 diabetes with mild nonproliferative retinopathy (HCC/RAF) 04/24/2020     No diabetic retinopathy seen today.  The importance of tight blood sugar (goal HgA1c <7) and blood pressure (<140/80) was emphasized. Patient understands the need for glucose monitoring/control, compliance with treatment and recommendations, and need for regular dilated fundus examinations, at least annually.      Aortic atherosclerosis (HCC/RAF) 03/13/2020    Tortuous aorta (HCC/RAF) 03/13/2020    Long term (current) use of insulin (HCC/RAF) 09/19/2019    Colon polyps-2020 09/19/2019    Mild nonproliferative diabetic retinopathy (HCC/RAF) 02/14/2019     None right eye.  Very minimal left eye.  Maintain good blood sugar and blood pressure under direction of diabetes doctor.          Pseudophakia 02/14/2019     Multifocal IOLs.  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  RNFL OCT 02/14/2019 normal OU.  04/24/2020 normal, both eyes.  -> repeat oct in 1 year.        Edema leg 07/14/2018    Diabetic neuropathy (HCC/RAF) 01/27/2018    Trigger thumb 01/27/2018    History of fracture of left ankle 12/01/2017    Hypertension associated with diabetes (HCC/RAF) 09/03/2017    DM type 1 with diabetic mixed hyperlipidemia (HCC/RAF) 09/03/2017    DM (diabetes mellitus), type 1, uncontrolled, periph vascular complic 09/03/2017    Peripheral vascular disease (HCC/RAF) 09/03/2017    DM (diabetes mellitus), type 1, uncontrolled w/neurologic complication 09/03/2017     IMO 10-19-20 Regulatory Update      Left ankle pain 04/09/2017    Abnormal EKG 08/23/2012    Palpitation 08/18/2012    Need for vaccination 08/18/2012    Healthcare maintenance 08/18/2012    Bursitis 06/05/2011    Skin cancer  06/05/2011     Possible, RT, ear         Past Medical History:   Diagnosis Date    Alcohol use disorder 09/15/2021    Aortic atherosclerosis (HCC/RAF) 03/13/2020    Colon polyps-2020 09/19/2019    Diabetic neuropathy (HCC/RAF) 01/27/2018    Diabetic peripheral vascular disease (HCC/RAF) 09/03/2017    DM type 1 with diabetic mixed hyperlipidemia (HCC/RAF) 09/03/2017    Edema leg 07/14/2018    Glaucoma     Suspect    History of fracture of left ankle 12/01/2017    Hypertension associated with diabetes (HCC/RAF) 09/03/2017    Long term (current) use of insulin (HCC/RAF) 09/19/2019    Peripheral vascular disease (HCC/RAF) 09/03/2017    Tortuous aorta (HCC/RAF) 03/13/2020    Trigger thumb 01/27/2018    Type 1 diabetes mellitus with diabetic neuropathy, unspecified (HCC/RAF) 09/03/2017       Medications that the patient states to be currently taking   Medication Sig    acetaminophen-codeine (TYLENOL #3) 300-30 mg tablet Take 1-2 tablets by mouth every six (6) hours as needed. Max Daily Amount: 8 tablets    glucose blood test strip Use as instructed    ibuprofen 600 mg tablet Take 1 tablet (600 mg total) by mouth every six (6) hours as needed.    insulin aspart, NovoLOG, (NOVOLOG FLEXPEN) 100 units/mL injection pen Inject 0.02-0.08 mLs (2-8 Units total) under the skin three (3) times daily before meals.    insulin pen needle (B-D UF NANO PEN NEEDLES) 32G X 4 mm Check BS 6 times daily.  DX Type 1 DM on insulin [E10.1, Z79.4].    metFORMIN (GLUCOPHAGE XR) 500 mg PO ER 24 hr tablet Take 2 tablets (1,000 mg total) by mouth two (2) times daily with meals.    simvastatin 20 mg tablet Take 1 tablet (20 mg total) by mouth.    SIMVASTATIN 20 mg tablet TAKE 1 TABLET BY MOUTH EVERYDAY AT BEDTIME         No Known Allergies     Family History   Problem Relation Age of Onset    Glaucoma Neg Hx     Macular degeneration Neg Hx        Social History     Socioeconomic History    Marital status: Married    Number of children: 4   Occupational History    Occupation: Soil scientist    Tobacco Use    Smoking status: Never    Smokeless tobacco: Never   Substance and Sexual Activity    Alcohol use: Yes     Alcohol/week: 4.2 oz     Types: 7 Standard drinks or equivalent per week        Review of Systems:  Other than noted above, a full 14-point review of systems was reviewed and is negative for: General, Eyes, ENMT, respiratory, cardiovascular, GI, GU, musculoskeletal, allergy/immunology, endocrinology, hematology, skin, neurologic, and psychiatric.      Objective:      There were no vitals filed for this visit.       Gen appearance: in NAD, conversational   HEENT; atraumatic head, normocephalic, anicteric sclera, PERRLA  CV: no edema  Resp: breathing comfortably on room air   Skin: No visible lesions or rashes  Psych: Normal affect, conversational     Neuro:  Orientation: patient is alert and oriented to person and situation   Attention: awake and alert; follows commands   Language: non aphasia nor dysarthria  CN  2: PERRLA, no visual field deficits   CN 3, 4, 6: EOMI, eyes conjugate, no nystagmus   CN 5: facial sensation intact   CN 7: no facial droop  CN 8: hearing intact to finger rub   CN 9, 10, 12: symmetric palate elevation; tongue midline   Tone: normal bulk and tone   Strength R/L:    Eye and lip closure   5   Deltoid   5/5   Biceps    5/5   Triceps    5/5   Wrist extension   5/5   Wrist flexion    5/5   FDI    5/5   ABP    5/5   IP    5/5   Hamstring    5/5   Quads    5/5   TA     5/5   Gastroc   5/5   Reflexes: 2+ biceps, brachioradialis, patellar   Movement: no tremor or abnormal movements   Sensation: no sensory deficits to LT   Coordination: no dysmetria on finger-to-nose  Gait: can stand unassisted, unable to tandem, has wide based gait              Lab Results   Component Value Date    WBC 7.75 08/17/2021    HGB 11.1 (L) 08/17/2021    HCT 33.0 (L) 08/17/2021    MCV 88.2 08/17/2021    PLT 315 08/17/2021     Lab Results   Component Value Date    CREAT 0.84 08/17/2021    BUN 17 08/17/2021    NA 129 (L) 08/17/2021    K 5.2 08/17/2021    CL 91 (L) 08/17/2021    CO2 25 08/17/2021     Lab Results   Component Value Date    HGBA1C 8.2 (H) 08/17/2021     Lab Results   Component Value Date    ALT 19 08/17/2021    AST 34 08/17/2021    ALKPHOS 69 08/17/2021    BILITOT 0.4 08/17/2021     Lab Results   Component Value Date    TSH 0.37 08/17/2021     Lab Results   Component Value Date    CALCIUM 10.0 08/17/2021    PHOS 2.8 08/10/2021     Lab Results   Component Value Date    CHOL 173 08/17/2021    CHOLHDL 109 08/17/2021    CHOLDLCAL 55 08/17/2021    CHOLDLQ 80 01/27/2018    TRIGLY 43 08/17/2021        Imaging:  No results found.    Neurologic Data:        Brain MRI 11/01/2021:  Focus of right cerebellar para vermian gliosis. A few white matter T2 hyperintensities, nonspecific though can reflect minimal chronic small vessel ischemic changes.     Brain MRA 11/01/2021:   1. 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   2. 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 A branch of the left M1 that supplies the superior division MCA territory originates at the neck of this aneurysm.   3. No areas of significant stenosis or occlusion.     Carotid Duplex 11/13/2021:  Mild, calcific plaque deposits in bilateral bifurcation and bilateral common carotid artery with stenosis less than 50%.       Routine EEG 12/05/2021:  This awake and drowsy EEG was normal. No epileptiform discharges or seizures were identified. However, interpretation was limited by frequent artifact.  Assessment:      Chad Watkins is a 76 y.o. male with history of recurrent falls, gait imbalance, and lapses in consciousness of unclear etiology. From video footage reviewed, patient is seen going from sitting to standing and stumbling somewhat before he completely loses his balance during one episodes, suggesting possibly a positional component. During the 2nd video, patient is already standing when he has sudden retropulsion leading to fall, and then when stood up appears to lose tone and fall back down to the ground. It is unclear if he was altered or confused during this first episodes as he was alone. Per daughter, he was somewhat confused for about 5 minutes with the second episode. Of note, he endorses having uncontrolled, insulin dependent diabetes and is unsure what his blood sugar was at the time of these episodes.    MRI brain was reviewed, and there was a small area of cerebellar gliosis which could theoretically cause the clumsiness and retropulsion, but would not explain the lapses in consciousness. MRA did show small aneurysms, which he reportedly had on prior studies and does not explain these events. He has not had cardiological evaluation or has any known history of arrhythmia, but another consideration would be cardiac syncope caused by an arrhythmia or heart block and so referral to cardiology was given.    EEG and carotid duplex done without significant abnormalities identified. We also discussed fall precautions, and getting up slowly, slow and steady steps, and avoiding impulsive movements to prevent falling. We discussed that if he does feel faint, to gently lower himself to the ground. If these episodes were to recur, family was asked to check his blood sugar and blood pressure, and perform some orienting questions to see how responsive he is during an episodes. We also discussed initiating PT for gait and balance training. We also discussed possibly getting insulin pump which he was offered in the past but not interested. Will consider.     Spanish translator was used. Carotid duplex and EEG results discussed in in detail.     No diagnosis found.  Plan/ Recommendation:     - EEG WNL; can consider repeat if recurrence of episodes   - Carotid Duplex mild plaque, <50% bilaterally  - Cardiac work-up thus far normal; awaiting ziopatch results   - c/w PT for gait and balance training  - RTC 4 months for follow-up    The above recommendation were discussed with the patient.  The patient had all questions answered satisfactorily and is in agreement with this recommended     Thank you for asking me to participate in the care of this patient.    Total minutes spent today including review of tests/notes, obtaining history, performing exam, counseling/educating patient/family, ordering tests/medications, communicating with other physicians, charting, independently interpreting results and communicating test results to patient/family =  34 minutes    Author: Lucienne Minks A. Jaycelynn Knickerbocker 01/07/2022 9:52 AM               Department of Neurology      [facility]

## 2022-01-08 ENCOUNTER — Ambulatory Visit: Payer: BLUE CROSS/BLUE SHIELD | Attending: Cardiovascular Disease

## 2022-01-08 DIAGNOSIS — R55 Syncope and collapse: Secondary | ICD-10-CM

## 2022-01-08 NOTE — Progress Notes
CARDIOLOGY CONSULT NOTE    DATE OF SERVICE: 01/08/2022    REFERRING PHYSICIAN: Trena Platt., DO    REASON FOR CONSULT: Syncope    HISTORY OF PRESENT ILLNESS: This is a 76 y.o. male with history of DM, HTN, EtOH use here for evaluation of falls and possible syncope. Pt reports that he had two events where he fell. Most recent event around Aug 2023. He went to the bathroom and when he came out of the bathroom he fell. He felt ''tiredness in the head''. He denies dizziness. He does not remember falling, had LOC, and remembers waking up on the floor. Daughter reports that on another episode he was shredding lettuce in the kitchen, lost strength in hs legs and fell. On another episodes he fell backwards and hit the back of his head. During another episode recorded on a Ring camera he stumbled back a few steps before falling. During another witnessed episode (also documented on a Ring camera) he quickly fell and daughter reports that he was out of it but she was able to help him stand up again briefly and then he fell again, was seated in a chair and quickly was able to talk and converse and back to himself. He denies any associated palps. He does not do any exercise. He denies CP or SOB with exercise. Family has checked his glucometer during these falls and it was ''normal''        01/08/22:  Saw neurology yesterday. Had MR Brain that showed small area of cerebellar gliosis which could theoretically cause the clumsiness and retropulsion, but would not explain the lapses in consciousness.   EEG done without significant abnormalities identified.   Carotid Duplex mild plaque, <50% bilaterally   Normal  Echo  Normal stress test, good exercise tolerance  Ziopatch pending  Normotensive in BP today.  Reports feeling occasionally dizzy when getting up too fast.    PAST MEDICAL HISTORY:   DM  HTN  EtOH use  Brain Aneurysm    Past Surgical History:   Procedure Laterality Date    APPENDECTOMY      YAG CAPSULOTOMY Left 10/14/2020 YAG CAPSULOTOMY Right 11/11/2020       ALLERGIES: No Known Allergies    CURRENT MEDICATIONS:   Current Outpatient Medications   Medication Sig    acetaminophen-codeine (TYLENOL #3) 300-30 mg tablet     acetaminophen-codeine (TYLENOL #3) 300-30 mg tablet Take 1-2 tablets by mouth every six (6) hours as needed. Max Daily Amount: 8 tablets    AMLODIPINE-BENAZEPRIL 5-20 mg capsule TAKE 1 CAPSULE BY MOUTH TWO (2) TIMES DAILY.    cephalexin 500 mg capsule Take 1 capsule (500 mg total) by mouth four (4) times daily.    ciclopirox 8% solution Apply topically at bedtime Apply over nail and surrounding skin.Marland Kitchen    FLUZONE HIGH-DOSE 0.5 ML syringe     furosemide 20 mg tablet Take 1-2 tablets (20-40 mg total) by mouth daily.    gatifloxacin 0.5% ophthalmic solution     glucagon (BAQSIMI TWO PACK) 3 mg/dose nasal powder 1 actuation (3 mg total) by Left Nare route every fifteen (15) minutes as needed.    glucose blood test strip Use as instructed    ibuprofen 600 mg tablet Take 1 tablet (600 mg total) by mouth every six (6) hours as needed.    insulin aspart, NovoLOG, (NOVOLOG FLEXPEN) 100 units/mL injection pen Inject 0.02-0.08 mLs (2-8 Units total) under the skin three (3) times daily before meals.  insulin glargine (BASAGLAR KWIKPEN) 100 units/mL injection pen Use 6 units daily. Waste 2 units to prime the needle.    insulin pen needle (B-D UF NANO PEN NEEDLES) 32G X 4 mm Check BS 6 times daily.  DX Type 1 DM on insulin [E10.1, Z79.4].    Insulin Pen Needle (BD AUTOSHIELD DUO) 30G X 5 MM MISC Use to inject insulin up to 6x daily.    LOTEMAX SM 0.38 % GEL     metFORMIN (GLUCOPHAGE XR) 500 mg PO ER 24 hr tablet Take 2 tablets (1,000 mg total) by mouth two (2) times daily with meals.    ONETOUCH ULTRA test strip USE 1 TEST STRIP TESTING 5 TIMES A DAY.    PROLENSA 0.07 % SOLN     SHINGRIX 50 MCG/0.5ML injection     sildenafil 100 mg tablet Take 0.5-1 tablets (50-100 mg total) by mouth daily as needed for Erectile Dysfunction. simvastatin (ZOCOR) 20 mg tablet TOME UNA TABLETA DIARIAMENTE    simvastatin 20 mg tablet Take 1 tablet (20 mg total) by mouth.    SIMVASTATIN 20 mg tablet TAKE 1 TABLET BY MOUTH EVERYDAY AT BEDTIME     No current facility-administered medications for this visit.     Facility-Administered Medications Ordered in Other Visits   Medication Dose Route Frequency    [COMPLETED] technetium tetrofosmin (Myoview) inj 25 millicurie  25 millicurie Intravenous Once    [COMPLETED] technetium tetrofosmin (Myoview) inj 8 millicurie  8 millicurie Intravenous Once       SOCIAL HISTORY:   Social History     Tobacco Use    Smoking status: Never    Smokeless tobacco: Never   Substance Use Topics    Alcohol use: Yes     Alcohol/week: 4.2 oz     Types: 7 Standard drinks or equivalent per week   Married, works in a market cooking    FAMILY HISTORY:  Family History   Problem Relation Age of Onset    Glaucoma Neg Hx     Macular degeneration Neg Hx        REVIEW OF SYSTEMS:  A complete 14 system review of systems was performed and was negative exception of those items mentioned in the HPI past medical history.    PHYSICAL EXAM:  BP 115/49  ~ Pulse 60  ~ Wt 144 lb 3.2 oz (65.4 kg)  ~ SpO2 98%  ~ BMI 24.75 kg/m?   General: The patient is well developed, well nourished and in no apparent distress.  The patient is cooperative with the physical examination and is pleasant during evaluation.    Psychiatric: Alert and oriented.  Affect is appropriate.    Head: Normocephalic, atraumatic.    Neck: Supple with full range of motion.  No masses or thyromegaly.  JVP is flat.    ENT: Sclerae are anicteric.  Oral mucosa is clear.  No epistaxis.    Cardiac: PMI is not displaced on palpation.  Normal S1-S2.  Heart is regular in rate and rhythm.  No murmur.  No rubs, gallops or thrills.    Vascular: No carotid bruits.  Radial pulses are 2+ and symmetric.  Pedal pulses are symmetric.  No femoral artery bruits.   Lungs: Breathing is non-labored.  No use of accessory muscles.  Lungs are clear to auscultation bilaterally without wheeze, rales, or rhonchi.    Abdomen: Bowel sounds are present.  Soft, nontender and nondistended.  No hepatomegaly appreciated.  No pulsatile mass.    Extremities: No  cyanosis, clubbing or edema.    Skin: No visible rash or ulceration.    Neuro: No focal deficits.  Strength is 5/5 in all four extremities.    DIAGNOSTIC STUDIES:    ECG dated 12/02/2021 was personally reviewed and demonstrates NSR @ 63 BPM, early repolarization    Lab Results   Component Value Date    WBC 7.75 08/17/2021    HGB 11.1 (L) 08/17/2021    HCT 33.0 (L) 08/17/2021    PLT 315 08/17/2021    CREAT 0.84 08/17/2021    BUN 17 08/17/2021    NA 129 (L) 08/17/2021    K 5.2 08/17/2021    CL 91 (L) 08/17/2021    CO2 25 08/17/2021    CHOL 173 08/17/2021    CHOLDLCAL 55 08/17/2021    CHOLHDL 109 08/17/2021    TRIGLY 43 08/17/2021    TROPONIN 8 (H) 08/10/2021    CKTOT 295 02/13/2020    INR 1.0 08/10/2021    BNP 161 (H) 08/10/2021    HGBA1C 8.2 (H) 08/17/2021    TSH 0.37 08/17/2021     IMAGING:     08/10/21 CXR - Normal cardiomediastinal silhouette. Calcified and mildly tortuous thoracic aorta.  Normal lung volumes. No consolidation. Mild bronchial wall thickening. Left basilar atelectasis.  No pleural effusions. No pneumothorax. Patchy opacification over the lower thoracic vertebral bodies seen only on lateral view, may represent a focal infectious/inflammatory process, likely in the left lung base. No focal dense lung consolidation.  Degenerative changes within the spine.    11/01/21 Brain MRI -  1. 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   2. 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 A branch of the left M1 that supplies the superior division MCA territory originates at the neck of this aneurysm.   3. No areas of significant stenosis or occlusion.    Echo adult transthoracic complete  Result Date: 01/07/2022   1. Normal left ventricular size.   2. Left ventricular ejection fraction is approximately 60 to 65%.   3. Normal LV diastolic function.   4. There are no prior studies on this patient for comparison purposes.  [id number] Billy Fischer MD        NM treadmill test-ECG  Result Date: 01/04/2022   1. Good exercise tolerance, reaching 5 min and 00 sec on a Bruce protocol, and achieving a peak heart rate of 143 bpm (102% of maximum predicted), 7.0 METS, and a double product of 25,404. .   2. No evidence of ischemia by anginal symptoms or electrocardiographic findings.  MPI Normal. LVEF 66%        11/2021 Ziopatch - Patient had a min HR of 53 bpm, max HR of 190 bpm, and avg HR of 84  bpm. Predominant underlying rhythm was Sinus Rhythm. 2  Supraventricular Tachycardia runs occurred, the run with the fastest  interval lasting 5 beats with a max rate of 190 bpm (avg 174 bpm); the  run with the fastest interval was also the longest. Isolated SVEs were rare  (<1.0%), SVE Couplets were rare (<1.0%), and SVE Triplets were rare  (<1.0%). Isolated VEs were rare (<1.0%), VE Couplets were rare (<1.0%),  and no VE Triplets were present.        ASSESSMENT: 76 y.o. male with history of DM, HTN, EtOH use here for evaluation of falls and possible syncope    PLAN:    1. Syncope/Falls -  No cardiac etiology identified  No post-ictal state by history  and by review of the Ring camera videos  Echo, TM Myoview, Ziopatch were all negative  Query to what degree alcohol playing a role?  Orthostatics negative on today's exam  Daughter denies hypoglycemia as a cause    2. HTN -  BP controlled    3. DM -  Mgmt as per PCP    F/u after above    Thank you again for including me in the care of [facility] .    Sincerely,    Billy Fischer, MD  [facility]  [address] [place], [address]  [place], [place] [address]  T - [phone]  F - [phone]

## 2022-01-13 MED ORDER — METFORMIN HCL ER 500 MG PO TB24
ORAL_TABLET | 3 refills
Start: 2022-01-13 — End: ?

## 2022-01-14 MED ORDER — METFORMIN HCL ER 500 MG PO TB24
ORAL_TABLET | 3 refills | Status: AC
Start: 2022-01-14 — End: ?

## 2022-01-14 NOTE — Treatment Summary
PHYSICAL THERAPY TREATMENT NOTE    PATIENT: Chad Watkins  GENDER: male  AGE: 76 y.o.  DOB: [birth year]  MRN: [id number]    Visit Number: 3 of 10  Plan of Care Expires: 03/24/2022  Authorization expires:  Medical necessity     PRECAUTIONS: Dizziness/lightheadedness, history of fainting, HTN, type 1 diabetes, peripheral vascular disease    Diagnosis:   Encounter Diagnoses   Name Primary?    Balance problem Yes       SUBJECTIVE:   Patient reports that he followed up with the cardiologist and neurologist, and  the findings are pretty much normal. He has been doing his exercises.     Pain: not at physical therapy for pain    OBJECTIVE:    HOME EXERCISE PROGRAM:  Access Code: [email or web address]    THERAPEUTIC EXERCISE / NEURO-MUSCULAR RE-EDUCATION:        Exercise Volume Notes   Range of Motion                           Stretching                     Strengthening                                       Balance / Neuromuscular Re-Education   x Tandem balance 2x30''    x Tandem balance with head turns X15ea horizontal  X15ea vertical     Single leg stance balance 2x30''    x Wobbleboard anterior/posterior  x10ea     Dynadisc marching x10ea    x Wobbleboard anterior/posterior staggered stance x15ea    x Wobbleboard  lateral  x15ea    x Wobbleboard surfer balance x1'     Tandem walking forward/backward 6x6'    x Blue foam high knee march x15ea                Cardiopulmonary               X: performed today  DC: discharged from in-clinic and home exercise program  NV: plan to perform at a future visit      PATIENT EDUCATION:   Education Content: continued plan of care, reasoning for performance of exercises and other treatment, home exercise program (continue with current program)     Education Delivery Method: verbal, written (in spanish), and demonstration   Education Response: verbalizes understanding and demonstrates understanding    ASSESSMENT:   Changes to impairments and function include no significant changes since last treatment session.   Patient performed neuromuscular re-education activities as noted in objective section above, with the goal of improving overall balance for improved community and household safety . They demonstrated good understanding and verbalized understanding with minimal verbal and visual cues.   Patient's home exercise program was modified to include exercises performed today in clinic.      Patient continues to demonstrate limitations with functional activities of standing, work duties, positional changes, sit to stand, and needs continued skilled physical therapy for return to prior level of independence with household activities, independent community function, work function, and participation in recreational activities     PLAN:   Continue to progress plan of care as tolerated.  Add: balance challenges, incorporating head movements and positional changes  Progress:      PTA Communication: All portions of  the treatment plan may be delegated to the PTA with the exception of spinal joint mobilizations and grade IV and above peripheral joint mobilizations.     Total Treatment time (minutes) Total time represented by timed CPT codes (minutes)   28   27         Nayara Taplin N. Clayvon Parlett, DPT

## 2022-01-15 ENCOUNTER — Ambulatory Visit: Payer: BLUE CROSS/BLUE SHIELD

## 2022-01-22 ENCOUNTER — Ambulatory Visit: Payer: BLUE CROSS/BLUE SHIELD

## 2022-01-22 DIAGNOSIS — E119 Type 2 diabetes mellitus without complications: Secondary | ICD-10-CM

## 2022-01-22 MED ORDER — NOVOLOG FLEXPEN 100 UNIT/ML SC SOPN
2-8 [IU] | Freq: Three times a day (TID) | SUBCUTANEOUS | 19 refills
Start: 2022-01-22 — End: ?

## 2022-01-22 MED ORDER — AMLODIPINE BESY-BENAZEPRIL HCL 5-20 MG PO CAPS
1 | ORAL_CAPSULE | Freq: Two times a day (BID) | ORAL | 1 refills
Start: 2022-01-22 — End: ?

## 2022-01-22 NOTE — Progress Notes
PATIENT: Chad Watkins  MRN: [id number]  DOB: 10/30/1945  DATE OF SERVICE: 01/22/2022    REFERRING PRACTITIONER: Lubertha Sayres., MD   PRIMARY CARE PROVIDER: Trena Platt., DO    Reason for Consultation: Type 1 Diabetes Mellitus    Subjective:     History of Present Illness  Chad Watkins is a 77 y.o. male with type 1 diabetes mellitus. The patient presents for follow-up to the [facility].       Type 1 diabetes was diagnosed in 1989, and treated with insulin then.     He denies hx of DKA, but has had ''comas'' attributed to hypoglycemia that required paramedic visits, twice in July 2019.      Interval events:  Not using insulin at work due to fear lows. Likes sugars to be higher.   Bolusing mostly when his sugar is high and he is going to eat.   Patient is testing BG 4x daily when not using CGM.   Patient uses insulin 4x daily.     When patient has CGM device, patient uses it consistently.   Patient tests blood glucose to calibrate their CGM.   Patient continues to dose insulin >3x/day and frequently adjusts insulin dosage based on the CGM device information.      Current DM Medications  Continuar Lantus 7 units en la manana    Para desayuno    Blood glucose   Humalog Insulin  101-200    4  Units  201-300    5 units  >301     6 units     Para Lonche y Cena    Blood glucose   Humalog Insulin  101-200    3  Units  201-300    4 units  >301     5 units     Glucose Review:  Data from glucose meter:        Dexcom Personal CGM Physician Interpretation Report  Dates Reviewed   (> 72 hours):  01/09/2022-01/22/2021   Avg Glucose:   246   % Serious Low (<54 mg/dL):  0   % Low (<45 mg/dL):  0   % In Target (40-981 mg/dL):  26   % High (191-478 mg/dL):  27   % Serious High (> 250 mg/dL):  47   Glucose variability:   34.3      In examining the patterns and providing interpretation, I noted normal fasting sugars. No hypoglycemia trends. Having significant PP hyperglycemia due to insufficient insulin with meals and/or poor timing of insulin. Based on cgm interpretation, recommended using insulin more often before meals. Discussed benefits of cequr or insulin pump. Patient is interested in insulin pump and daughter can help with placement.     Exercise:   Walks. On his feet 8 hrs at work all day.      Complications & Associated Co-morbidities Yes No Comments/Treatment to Date   Retinopathy  []    [x]      Nephropathy  []    [x]   Lab Results   Component Value Date    CREAT 0.84 08/17/2021    ALBCREATUR  09/09/2020      Comment:      Unable to calculate. Test result is below detection limit.       Neuropathy  []    [x]      Gastroparesis  []   [x]     CAD  []    [x]      Stroke  []    [  x]     PVD  []   [x]     Sleep Apnea  [x]    []   Not sure. Poor sleep?       Past Medical History  Past Medical History:   Diagnosis Date    Alcohol use disorder 09/15/2021    Aortic atherosclerosis (HCC/RAF) 03/13/2020    Colon polyps-2020 09/19/2019    Diabetic neuropathy (HCC/RAF) 01/27/2018    Diabetic peripheral vascular disease (HCC/RAF) 09/03/2017    DM type 1 with diabetic mixed hyperlipidemia (HCC/RAF) 09/03/2017    Edema leg 07/14/2018    Glaucoma     Suspect    History of fracture of left ankle 12/01/2017    Hypertension associated with diabetes (HCC/RAF) 09/03/2017    Long term (current) use of insulin (HCC/RAF) 09/19/2019    Peripheral vascular disease (HCC/RAF) 09/03/2017    Tortuous aorta (HCC/RAF) 03/13/2020    Trigger thumb 01/27/2018    Type 1 diabetes mellitus with diabetic neuropathy, unspecified (HCC/RAF) 09/03/2017       Past Surgical History  Past Surgical History:   Procedure Laterality Date    APPENDECTOMY      YAG CAPSULOTOMY Left 10/14/2020    YAG CAPSULOTOMY Right 11/11/2020       Medications  Current Outpatient Medications   Medication Sig    acetaminophen-codeine (TYLENOL #3) 300-30 mg tablet     acetaminophen-codeine (TYLENOL #3) 300-30 mg tablet Take 1-2 tablets by mouth every six (6) hours as needed. Max Daily Amount: 8 tablets    AMLODIPINE-BENAZEPRIL 5-20 mg capsule TAKE 1 CAPSULE BY MOUTH TWO (2) TIMES DAILY.    cephalexin 500 mg capsule Take 1 capsule (500 mg total) by mouth four (4) times daily.    ciclopirox 8% solution Apply topically at bedtime Apply over nail and surrounding skin.Marland Kitchen    FLUZONE HIGH-DOSE 0.5 ML syringe     furosemide 20 mg tablet Take 1-2 tablets (20-40 mg total) by mouth daily.    gatifloxacin 0.5% ophthalmic solution     glucagon (BAQSIMI TWO PACK) 3 mg/dose nasal powder 1 actuation (3 mg total) by Left Nare route every fifteen (15) minutes as needed.    glucose blood test strip Use as instructed    ibuprofen 600 mg tablet Take 1 tablet (600 mg total) by mouth every six (6) hours as needed.    insulin aspart, NovoLOG, (NOVOLOG FLEXPEN) 100 units/mL injection pen Inject 0.02-0.08 mLs (2-8 Units total) under the skin three (3) times daily before meals.    insulin glargine (BASAGLAR KWIKPEN) 100 units/mL injection pen Use 6 units daily. Waste 2 units to prime the needle.    insulin pen needle (B-D UF NANO PEN NEEDLES) 32G X 4 mm Check BS 6 times daily.  DX Type 1 DM on insulin [E10.1, Z79.4].    Insulin Pen Needle (BD AUTOSHIELD DUO) 30G X 5 MM MISC Use to inject insulin up to 6x daily.    LOTEMAX SM 0.38 % GEL     metFORMIN (GLUCOPHAGE XR) 500 mg PO ER 24 hr tablet TAKE 2 TABLETS (1,000 MG TOTAL) BY MOUTH TWICE A DAY WITH FOOD    ONETOUCH ULTRA test strip USE 1 TEST STRIP TESTING 5 TIMES A DAY.    PROLENSA 0.07 % SOLN     SHINGRIX 50 MCG/0.5ML injection     sildenafil 100 mg tablet Take 0.5-1 tablets (50-100 mg total) by mouth daily as needed for Erectile Dysfunction.    simvastatin (ZOCOR) 20 mg tablet TOME UNA TABLETA DIARIAMENTE  simvastatin 20 mg tablet Take 1 tablet (20 mg total) by mouth.    SIMVASTATIN 20 mg tablet TAKE 1 TABLET BY MOUTH EVERYDAY AT BEDTIME     No current facility-administered medications for this visit.     Allergies  Patient has no known allergies.    Family History  Family History   Problem Relation Age of Onset    Glaucoma Neg Hx     Macular degeneration Neg Hx        Social History  Social History     Socioeconomic History    Marital status: Married    Number of children: 4   Occupational History    Occupation: Soil scientist    Tobacco Use    Smoking status: Never    Smokeless tobacco: Never   Substance and Sexual Activity    Alcohol use: Yes     Alcohol/week: 4.2 oz     Types: 7 Standard drinks or equivalent per week       Objective:      Vitals: BP 139/69  ~ Pulse 60  ~ Wt 144 lb 12.8 oz (65.7 kg)  ~ BMI 24.85 kg/m?    Wt Readings from Last 3 Encounters:   01/22/22 144 lb 12.8 oz (65.7 kg)   01/08/22 144 lb 3.2 oz (65.4 kg)   01/07/22 144 lb 3.2 oz (65.4 kg)            Additional Findings   General  [x]  NAD  [x]  Well nourished      Eyes  [x]  Moist conjunctivae   [x]  Anicteric sclera []  PERRL  [x]  No lid lag    HENT  [x]  Normocephalic  [x]  Atraumatic   []  oropharynx clear with moist mucous membranes  []  no caries    Neck  []  Neck supple    []  No JVD  []  No cervical fat pad     Thyroid  []  Normal size  []  No masses/nodules  []  Non-tender  []  No bruit    Lymph  []  No cervical LAD   []  No axillary LAD      Respiratory  []  CTABL  []  No wheezes  []  No crackles []  Normal effort    CV  []  RRR  []  Normal S1/S2  []  No m/r/g       []  Pedal pulses 2+  []  No carotid bruit  []  No LE edema     Abdomen:  []  Soft  []  Non-tender  []  No organomegaly  []  Normal BS    MSK  []  Normal gait  []  Normal ROM   []  Normal muscle tone  []  No joint tenderness    Neuro  []  Normal       monofilament  []  Normal vibratory sense  []  Normal strength  []  DTR 2+    Derm  []  No foot ulcers  [x]  No rashes   []  No lipohypertrophy      Psych  [x]  Appropriate affect  [x]  Alert and oriented x3          Lab Review:  Lab Results   Component Value Date    HGBA1C 8.2 (H) 08/17/2021    HGBA1C 8.3 06/24/2021    HGBA1C 7.0 01/22/2021      Lab Results   Component Value Date    GLUCOSE 253 (H) 08/17/2021    ALBCREATUR 09/09/2020      Comment:      Unable to calculate. Test result is below detection limit.  CHOLDLQ 80 01/27/2018    CHOLDLCAL 55 08/17/2021    CPEPTIDE <0.2 (L) 09/03/2017      Lab Results   Component Value Date    CREAT 0.84 08/17/2021    BUN 17 08/17/2021    NA 129 (L) 08/17/2021    K 5.2 08/17/2021    CL 91 (L) 08/17/2021    CO2 25 08/17/2021      Lab Results   Component Value Date    WBC 7.75 08/17/2021    HGB 11.1 (L) 08/17/2021    HCT 33.0 (L) 08/17/2021    MCV 88.2 08/17/2021    PLT 315 08/17/2021      No results found for: ''OSMOLALITY''   Lab Results   Component Value Date    KETONESUR Negative 08/17/2021      No results found for: ''PHVEN'', ''PHART''   Lab Results   Component Value Date    CHOL 173 08/17/2021    CHOLHDL 109 08/17/2021    CHOLDLQ 80 01/27/2018    CHOLDLCAL 55 08/17/2021    TRIGLY 43 08/17/2021        Assessment/Plan:    77 y.o. male presenting to the [facility] with the following issues:    1. Type 1 Diabetes Mellitus  Lab Results   Component Value Date    HGBA1C 8.2 (H) 08/17/2021    HGBA1C 8.3 06/24/2021    HGBA1C 7.0 01/22/2021     Uncontrolled T1DM.  No known DM complications. Complicated by severe hypoglycemia.    A1C goal < 7.5%: to reduce risk of diabetes related complications to the eyes, feet and kidneys with additional goal of hypoglycemia risk reduction.  On CGM,  I noted normal fasting sugars. No hypoglycemia trends. Having significant PP hyperglycemia due to insufficient insulin with meals and/or poor timing of insulin. Based on cgm interpretation, recommended using insulin more often before meals. Discussed benefits of cequr or insulin pump. Patient is interested in insulin pump and daughter can help with placement.   Referral for omnipod 5 placed.    -Please see new DM regimen below in patient instructions   -High frequency glucose monitoring with CGM   -Hypoglycemia protocol reviewed. ''Rule of 15'' handout given also. Carry glucose tabs at all times.   -Check glucose prior to driving. Glucose must be > 100 mg/dl. Reviewed risks related to driving with hypoglycemia   -Glucagon reviewed.  Baqsimi (glucagon) nasal spray if possible.  Ordered.  Waiting for coupon.     2. Hypertension   -Continue current regimen, including ACE-I inhibitor or ARB therapy    3. Hyperlipidemia   Per AHA guidelines, statin therapy is recommended with history of diabetes mellitus   -Continue statin therapy.    4. Diabetic microvascular disease screening:  Health Maintenance   Topic Date Due    Diabetes: Urine albumin-creatinine ratio monitoring  09/09/2021    Diabetes: EYE EXAM  11/19/2021    Diabetes: Hemoglobin A1c Blood Test  02/17/2022     5. ETOH use.  Hypoglycemia precautions discussed with patient.     6. Sleep Disorder.  Dtr suspects OSA.  See PMD re: eval.    RTC in 4 monhts    Orders Placed This Encounter    Albumin/Creat Ratio Ur    POCT Glucose (Ambulatory Protocol - POCT Glucose)     42 minutes were spent personally by me today on this encounter which include today's pre-visit review of the chart, obtaining appropriate history, performing an evaluation,  documentation and discussion of management with details supported within the note for today's visit. The time documented was exclusive of any time spent on the separately billed procedure.      Author: Jeri Cos. Noreene Filbert, MD 01/22/2022 11:15 AM     There are no Patient Instructions on file for this visit.

## 2022-01-23 LAB — Albumin/Creatinine Ratio,Urine: CREATININE,RANDOM URINE: 5.2 mg/dL

## 2022-01-24 MED ORDER — NOVOLOG FLEXPEN 100 UNIT/ML SC SOPN
2-8 [IU] | Freq: Three times a day (TID) | SUBCUTANEOUS | 4 refills | Status: AC
Start: 2022-01-24 — End: ?

## 2022-01-26 ENCOUNTER — Ambulatory Visit: Payer: BLUE CROSS/BLUE SHIELD

## 2022-01-26 NOTE — Treatment Summary
PHYSICAL THERAPY TREATMENT NOTE    PATIENT: Chad Watkins  GENDER: male  AGE: 77 y.o.  DOB: 11/27/1945  MRN: [id number]    Visit Number: 4 of 10  Plan of Care Expires: 03/24/2022  Authorization expires:  Medical necessity     PRECAUTIONS: Dizziness/lightheadedness, history of fainting, HTN, type 1 diabetes, peripheral vascular disease    Diagnosis:   Encounter Diagnoses   Name Primary?    Balance problem Yes         SUBJECTIVE:   Patient reports that he tries to do his exercises but sometimes feels like he is not doing them right.      Pain: not at physical therapy for pain    OBJECTIVE:    HOME EXERCISE PROGRAM:  Access Code: [email or web address]    THERAPEUTIC EXERCISE / NEURO-MUSCULAR RE-EDUCATION:         Exercise Volume Notes   Range of Motion                           Stretching                     Strengthening                                       Balance / Neuromuscular Re-Education    Tandem balance 2x30''     Tandem balance with head turns X15ea horizontal  X15ea vertical    x Single leg stance balance 2x30''    x Single leg stance with horizontal head turns X30''ea    x Wobbleboard anterior/posterior  X20ea DL  [id number] SL    x Dynadisc marching x10ea     Wobbleboard anterior/posterior staggered stance x15ea    x Wobbleboard  lateral  x15ea    x Wobbleboard surfer balance x1'    x Tandem walking forward/backward 6x6'     Blue foam high knee march x15ea    x Staggered stance with ball toss to rebounder x15ea  Blue yoga ball   x Step ups to 6'' step + blue foam x10ea                      Cardiopulmonary               X: performed today  DC: discharged from in-clinic and home exercise program  NV: plan to perform at a future visit      PATIENT EDUCATION:   Education Content: continued plan of care, reasoning for performance of exercises and other treatment, home exercise program (continue with current program)      Education Delivery Method: verbal, written (in spanish), and demonstration   Education Response: verbalizes understanding and demonstrates understanding    ASSESSMENT:   Changes to impairments and function include no significant changes since last treatment session.   Patient was able to tolerate progressions in neuromuscular re-education activities of performing balance exercises on a more unstable surface.  Patient performed therapeutic exercises and neuromuscular re-education activities as noted in objective section above, with the goal of improving muscle strength of bilateral lower extremities  and improving overall balance for improved household and community safety . They demonstrated good understanding and verbalized understanding with minimal verbal, visual, and tactile cues.   Patient's home exercise program was modified to include exercises performed today in clinic.  Patient continues to demonstrate limitations with functional activities of standing, work duties, positional changes, sit to stand, and needs continued skilled physical therapy for return to prior level of independence with household activities, independent community function, work function, and participation in recreational activities     PLAN:   Continue to progress plan of care as tolerated.  Add: balance challenges, incorporating head movements and positional changes  Progress:       PTA Communication: All portions of the treatment plan may be delegated to the PTA with the exception of spinal joint mobilizations and grade IV and above peripheral joint mobilizations.     Total Treatment time (minutes) Total time represented by timed CPT codes (minutes)   28    27          Raizel Wesolowski N. Eudora Guevarra, DPT

## 2022-01-27 NOTE — Progress Notes
Results are normal.

## 2022-01-28 ENCOUNTER — Telehealth: Payer: BLUE CROSS/BLUE SHIELD

## 2022-01-28 MED ORDER — OMNIPOD 5 G6 POD (GEN 5) MISC
4 refills | Status: AC
Start: 2022-01-28 — End: ?

## 2022-01-28 MED ORDER — OMNIPOD 5 G6 INTRO (GEN 5) KIT
PACK | 0 refills | Status: AC
Start: 2022-01-28 — End: ?

## 2022-01-28 NOTE — Addendum Note
Addended by: Kai Levins on: 01/28/2022 03:33 PM     Modules accepted: Orders

## 2022-01-28 NOTE — Telephone Encounter
-----   Message from [place]. Rosiland Oz, MD sent at 01/22/2022  2:24 PM PST -----  Please assist with omnipod referral. Thank you.

## 2022-01-28 NOTE — Telephone Encounter
Omniopod Rx sent to [facility]   Thank you.

## 2022-01-30 MED ORDER — AMLODIPINE BESY-BENAZEPRIL HCL 5-20 MG PO CAPS
1 | ORAL_CAPSULE | Freq: Two times a day (BID) | ORAL | 1 refills | Status: AC
Start: 2022-01-30 — End: ?

## 2022-02-10 ENCOUNTER — Ambulatory Visit: Payer: BLUE CROSS/BLUE SHIELD

## 2022-02-10 NOTE — Treatment Summary
PHYSICAL THERAPY TREATMENT NOTE    PATIENT: Chad Watkins  GENDER: male  AGE: 77 y.o.  DOB: Mar 27, 1945  MRN: [id number]    Visit Number: 5 of 10  Plan of Care Expires: 03/24/2022  Authorization expires:  Medical necessity     PRECAUTIONS: Dizziness/lightheadedness, history of fainting, HTN, type 1 diabetes, peripheral vascular disease    Diagnosis:   Encounter Diagnoses   Name Primary?    Balance problem Yes       SUBJECTIVE:   Patient reports that he is continuing to try to do all of his exercises consistently, but he is not seeing any significant progress.      Pain: not at physical therapy for pain    OBJECTIVE:    HOME EXERCISE PROGRAM:  Access Code: [email or web address]    THERAPEUTIC EXERCISE / NEURO-MUSCULAR RE-EDUCATION:        Exercise Volume Notes   Range of Motion                           Stretching                     Strengthening   x Hip 3 way x10ea Red theraband                                  Balance / Neuromuscular Re-Education    Tandem balance 2x30''     Tandem balance with head turns X15ea horizontal  X15ea vertical     Single leg stance balance 2x30''     Single leg stance with horizontal head turns X30''ea     Wobbleboard anterior/posterior  X20ea DL  E95MW SL     Dynadisc marching x10ea    x Wobbleboard anterior/posterior  X15ea DL and SL     Wobbleboard  lateral  x15ea     Wobbleboard surfer balance x1'    x Tandem walking forward/backward 6x6'     Blue foam high knee march x15ea    x Staggered stance with ball toss to rebounder x15ea  Yellow physioball   x Step ups to 6'' step + blue foam x10ea                      Cardiopulmonary               X: performed today  DC: discharged from in-clinic and home exercise program  NV: plan to perform at a future visit    PATIENT EDUCATION:   Education Content: continued plan of care, reasoning for performance of exercises and other treatment, home exercise program (continue with current program)       Education Delivery Method: verbal, written (in spanish), and demonstration   Education Response: verbalizes understanding and demonstrates understanding    ASSESSMENT:   Changes to impairments and function include no significant changes since last treatment session.   Patient was able to tolerate progressions in neuromuscular re-education activities of performing balance exercises on a more unstable surface.  Patient performed therapeutic exercises and neuromuscular re-education activities as noted in objective section above, with the goal of improving muscle strength of bilateral lower extremities  and improving overall balance for improvved household and community safety . They demonstrated good understanding and verbalized understanding with minimal verbal, visual, and tactile cues.   Patient's home exercise program was modified to include exercises performed today  in clinic.     Patient continues to demonstrate limitations with functional activities of standing, work duties, positional changes, sit to stand, and needs continued skilled physical therapy for return to prior level of independence with household activities, independent community function, work function, and participation in recreational activities     PLAN:   Continue to progress plan of care as tolerated.  Add: balance challenges, incorporating head movements and positional changes  Progress:        PTA Communication: All portions of the treatment plan may be delegated to the PTA with the exception of spinal joint mobilizations and grade IV and above peripheral joint mobilizations.     Total Treatment time (minutes) Total time represented by timed CPT codes (minutes)   28     26           Saisha Hogue N. Darrelyn Morro, DPT

## 2022-02-16 ENCOUNTER — Ambulatory Visit: Payer: BLUE CROSS/BLUE SHIELD

## 2022-02-16 NOTE — Treatment Summary
PHYSICAL THERAPY TREATMENT NOTE    PATIENT: Chad Watkins  GENDER: male  AGE: 77 y.o.  DOB: 1945-07-21  MRN: [id number]    Visit Number: 6 of 10  Plan of Care Expires: 03/24/2022  Authorization expires:  Medical necessity     PRECAUTIONS: Dizziness/lightheadedness, history of fainting, HTN, type 1 diabetes, peripheral vascular disease    Diagnosis:   Encounter Diagnoses   Name Primary?    Balance problem Yes     SUBJECTIVE:   Patient reports that he has been feeling okay. He does exercises daily, not always all of them every day but does get a few done daily.  The tandem walking especially is harder when he is not looking down at his feet    Pain: not at physical therapy for pain    OBJECTIVE:    HOME EXERCISE PROGRAM:  Access Code: [email or web address]    THERAPEUTIC EXERCISE / NEURO-MUSCULAR RE-EDUCATION:         Exercise Volume Notes   Range of Motion                           Stretching                     Strengthening    Hip 3 way x10ea Red theraband    x  Lunge to bosu X75fwd  x10lat                            Balance / Neuromuscular Re-Education    Tandem balance 2x30''     Tandem balance with head turns X15ea horizontal  X15ea vertical    x  Single leg stance balance 3x30''     Single leg stance with horizontal head turns X30''ea     Wobbleboard anterior/posterior  X20ea DL  [id number] SL    x  Dynadisc marching x10ea     Wobbleboard anterior/posterior  X15ea DL and SL     Wobbleboard  lateral  x15ea     Wobbleboard surfer balance x1'    x  Tandem walking forward/backward 4x10'  No hand holding on rail; CGA by PT    Blue foam high knee march x15ea    x  Staggered stance with ball toss to rebounder x15ea  Yellow physioball   x  Step ups to 6'' step + blue foam x10ea                      Cardiopulmonary               X: performed today  DC: discharged from in-clinic and home exercise program  NV: plan to perform at a future visit    PATIENT EDUCATION:   Education Content: continued plan of care, reasoning for performance of exercises and other treatment, home exercise program (continue with current program)        Education Delivery Method: verbal, written (in spanish), and demonstration   Education Response: verbalizes understanding and demonstrates understanding    ASSESSMENT:   Changes to impairments and function include no significant changes since last treatment session.   Patient performed new neuromuscular re-education activities of bosu lunges with the goal of improving overall balance for improved stability and decreased fall risk.   Patient was able to tolerate progressions in neuromuscular re-education activities of performing balance exercises on a more unstable surface. They demonstrated good understanding and  verbalized understanding with minimal verbal and visual cues.   Patient's home exercise program was modified to include exercises performed today in clinic.     Patient continues to demonstrate limitations with functional activities of standing, work duties, positional changes, sit to stand, and needs continued skilled physical therapy for return to prior level of independence with household activities, independent community function, work function, and participation in recreational activities     PLAN:   Continue to progress plan of care as tolerated.  Add: balance challenges, incorporating head movements and positional changes  Progress note/ discharge- last visit next time     PTA Communication: All portions of the treatment plan may be delegated to the PTA with the exception of spinal joint mobilizations and grade IV and above peripheral joint mobilizations.     Total Treatment time (minutes) Total time represented by timed CPT codes (minutes)   29      27            Nickie Deren N. Bereket Gernert, DPT

## 2022-02-24 ENCOUNTER — Ambulatory Visit: Payer: BLUE CROSS/BLUE SHIELD

## 2022-02-24 NOTE — Treatment Summary
PHYSICAL THERAPY TREATMENT NOTE    PATIENT: Chad Watkins  GENDER: male  AGE: 77 y.o.  DOB: 04/07/1945  MRN: [id number]    Visit Number: 7 of 10  Plan of Care Expires: 03/24/2022  Authorization expires:  Medical necessity     PRECAUTIONS: Dizziness/lightheadedness, history of fainting, HTN, type 1 diabetes, peripheral vascular disease    Diagnosis:   Encounter Diagnoses   Name Primary?    Balance problem Yes       SUBJECTIVE:   Patient reports that he is doing okay. He has been doing okay with his exercises at home. He does get some pain in his legs when he first wakes up, but the pain does resolve with walking around a bit and drinking more water.     Pain: not at physical therapy for pain    OBJECTIVE:     At Evaluation  12/30/2021 Today  02/24/2022   Single Leg Stance 5sec Right  3sec Left 4'' right   4'' left    Tandem stance eyes open 6sec Right in front  14sec Left in front 30'' right in front  30'' left in front   5 time sit to stand 12.2 seconds 10.12 seconds    Timed Up and Go 9.33 seconds 8.27 seconds    Dynamic Gait Index 21/24 23/24       HOME EXERCISE PROGRAM:  Access Code: [email or web address]    THERAPEUTIC EXERCISE / NEURO-MUSCULAR RE-EDUCATION:      Single leg stance balance 2x30''ea  Tandem stance balance x30''ea  Stairs x10ea  Wobbleboard anterior/posterior x10ea  Wobbleboard anterior/posterior staggered stance x10ea  Wobbleboard lateral x10ea  Tandem stance ball toss to rebounder yellow physioball x10ea      PATIENT EDUCATION:   Education Content: continued plan of care, reasoning for performance of exercises and other treatment, home exercise program (continue with current program)     Education Delivery Method: verbal, written (in spanish), and demonstration   Education Response: verbalizes understanding and demonstrates understanding    *FOR ALL GOALS: M = goal met, IP = goal in progress*  SHORT TERM GOALS: 5 visits  Patient will participate in home program management of deficits contributing to impaired balance, falls, and weakness  including exercise, balance drills, and strategies to compensate for sensory problems.   m  Patient will perform tandem stance without upper extremity support for >10 seconds on each side to demonstrate improved functional balance and decreased fall risk, resulting in improved safety in the community. m  Patient will be able to walk on even ground outside for 1 mile with symmetrical gait pattern without assistive device and without significant pain or compensations to return to prior level of community function m  Patient will ascend/descend 12 stairs with reciprocal gait pattern, one hand upper extremity support, and no compensations to return to prior level of household and community function. m    LONG TERM GOALS: 10 visits   Patient will be independent with home program management of deficits contributing to impaired balance, falls, and weakness includingexercise, balance drills, and strategies to compensate for sensory problems. m  Patient will perform Timed Up and Go (TUG) test in 9 seconds or less to demonstrate improved functional balance and decreased fall risk, resulting in improved safety in the community. m  Patient will perform single leg stance without upper extremity support for >20 seconds on each side to demonstrate improved functional balance and decreased fall risk, resulting in improved safety in the community. IP  Patient  will perform tandem stance without upper extremity support for >30 seconds on each side to demonstrate improved functional balance and decreased fall risk, resulting in improved safety in the community. m  Patient will score greater than or equal to 22/24 on the Dynamic Gait Index to demonstrate improved functional balance and decreased fall risk, resulting in improved safety in the community. m    ASSESSMENT:   Changes to impairments and function include improved overall function since last treatment session.   Patient performed therapeutic exercises and neuromuscular re-education activities as noted in objective section above, with the goal of improving muscle strength of bilateral lower extremities  and improving overall balance for improved community safety . They demonstrated good understanding and verbalized understanding with minimal verbal and visual cues.   Patient's home exercise program was modified to include exercises performed today in clinic.     He has shown good improvements with physical therapy treatment in terms of balance and safety with gait and balance assessments. He is ready to continue with an independent home exercise program to maintain good balance and community safety.     PLAN:   Discharge patient to independent home exercise program   Patient to contact the clinic or referring physician if anything new arises in the future.    PTA Communication: All portions of the treatment plan may be delegated to the PTA with the exception of spinal joint mobilizations and grade IV and above peripheral joint mobilizations.     Total Treatment time (minutes) Total time represented by timed CPT codes (minutes)   27       24             Jennier Schissler N. Shakila Mak, DPT

## 2022-03-19 ENCOUNTER — Ambulatory Visit: Payer: BLUE CROSS/BLUE SHIELD

## 2022-03-23 ENCOUNTER — Ambulatory Visit: Payer: BLUE CROSS/BLUE SHIELD

## 2022-03-25 ENCOUNTER — Ambulatory Visit: Payer: BLUE CROSS/BLUE SHIELD

## 2022-03-25 DIAGNOSIS — R262 Difficulty in walking, not elsewhere classified: Secondary | ICD-10-CM

## 2022-03-31 ENCOUNTER — Ambulatory Visit: Payer: BLUE CROSS/BLUE SHIELD

## 2022-04-09 ENCOUNTER — Ambulatory Visit: Payer: BLUE CROSS/BLUE SHIELD

## 2022-04-09 NOTE — Progress Notes
PHYSICAL THERAPY PROGRESS REPORT AND DISCHARGE    Reporting Period: 12/30/21  - 04/09/2022    PATIENT: Chad Watkins  GENDER: male  AGE: 77 y.o.  DOB: 08-Jan-1946  MRN: [id number]    Payor: Rachael Fee CROSS [place] / Plan: [employer] BLUE CROSS [place] PPO / Product Type: PPO /     Visit Number: 8  of 8  Plan of Care Expires: 04/09/2022   Referring Provider: Shawnee Knapp A., DO, MS    Diagnosis:  1. Balance problem          Precautions: Dizziness/lightheadedness, history of fainting, HTN, type 1 diabetes, peripheral vascular disease     Pain: not at PT for pain    SUBJECTIVE  Patient comes back to physical therapy after he was discharged to an independent home exercise program last visit. He reports that he has been practicing his balance exercises at home. His daughter called his physician to get a new referral and made today's appointment, he does not report that he has had any change in status since his last visit about 6 weeks ago. No reports of falls, and he has not had any follow up appointments with any of his physicians. The only symptom is some pain on the bottom of the feet when he first stands up in both feet.    OBJECTIVE       At Evaluation  12/30/2021   02/24/2022 04/09/2022   Single Leg Stance 5sec Right  3sec Left 4'' right   4'' left  5'' left   4'' right    Tandem stance eyes open 6sec Right in front  14sec Left in front 30'' right in front  30'' left in front 30'' ea   5 time sit to stand 12.2 seconds 10.12 seconds  9.87 seconds    Timed Up and Go 9.33 seconds 8.27 seconds  8.43 seconds    Dynamic Gait Index 21/24 23/24 23/24       HOME EXERCISE PROGRAM:  Access Code: [email or web address]     THERAPEUTIC EXERCISE / NEURO-MUSCULAR RE-EDUCATION:       Single leg stance balance 2x30''ea  Tandem stance balance x30''ea  Stairs x10ea  stagger stance ball toss to wall yellow yoga ball x10ea  stagger stance ball bounce on ground x15ea    Patient Education:   Education Content: home exercise program, reasoning for discharge from formal physical therapy, information provided on SAIL classes offered through [facility] rehab department if interested in a regular virtual group balance class to continue working on balance and stability for patients who have achieved maximum benefit and improvement with formal physical therapy treatment; encouraged patient to have his daughter contact me through [employer] if she has further questions regarding patient's discharge from formal physical therapy   Education Delivery Method: Verbal and demonstration   Education Response: Verbalizes understanding and demonstrates understanding  ------------------------------------------------------------------        SHORT TERM GOALS: 5 visits  Patient will participate in home program management of deficits contributing to impaired balance, falls, and weakness  including exercise, balance drills, and strategies to compensate for sensory problems.   MET  Patient will perform tandem stance without upper extremity support for >10 seconds on each side to demonstrate improved functional balance and decreased fall risk, resulting in improved safety in the community. MET  Patient will be able to walk on even ground outside for 1 mile with symmetrical gait pattern without assistive device and without significant pain or compensations to return to prior level of  community function MET  Patient will ascend/descend 12 stairs with reciprocal gait pattern, one hand upper extremity support, and no compensations to return to prior level of household and community function. MET     LONG TERM GOALS: 10 visits   Patient will be independent with home program management of deficits contributing to impaired balance, falls, and weakness includingexercise, balance drills, and strategies to compensate for sensory problems. MET  Patient will perform Timed Up and Go (TUG) test in 9 seconds or less to demonstrate improved functional balance and decreased fall risk, resulting in improved safety in the community. MET  Patient will perform single leg stance without upper extremity support for >20 seconds on each side to demonstrate improved functional balance and decreased fall risk, resulting in improved safety in the community. IN PROGRESS  Patient will perform tandem stance without upper extremity support for >30 seconds on each side to demonstrate improved functional balance and decreased fall risk, resulting in improved safety in the community. MET  Patient will score greater than or equal to 22/24 on the Dynamic Gait Index to demonstrate improved functional balance and decreased fall risk, resulting in improved safety in the community. MET      ASSESSMENT:  The patient?s condition has improved in response to therapy; maximum improvement in skilled physical therapy has likely been attained at this time.He has shown improvements in balance and coordination and overall ambulatory and stationary safety, and has met 8/9 of his goals for physical therapy. The patient would benefit from continuing with an independent home exercise program to continue to support his remaining balance deficits.      PLAN:   Discharge patient to independent home exercise program- provided education on [employer] program for virtual balance classes in case he is interested in balance.stability maintenance group classes.  Patient to contact physician if anything new arises in the future.       ------------------------------------------------------------------    Patient was seen for:  Total Treatment time (minutes) Total time represented by timed CPT codes (minutes)   29  28        Raed Schalk N. Unknown Foley, DPT  Board-Certified Clinical Specialist in Orthopaedic Physical Therapy

## 2022-04-15 ENCOUNTER — Ambulatory Visit: Payer: BLUE CROSS/BLUE SHIELD

## 2022-04-27 ENCOUNTER — Ambulatory Visit: Payer: BLUE CROSS/BLUE SHIELD

## 2022-05-04 ENCOUNTER — Ambulatory Visit: Payer: BLUE CROSS/BLUE SHIELD

## 2022-05-11 ENCOUNTER — Ambulatory Visit: Payer: BLUE CROSS/BLUE SHIELD | Attending: Neurology

## 2022-05-11 ENCOUNTER — Ambulatory Visit: Payer: BLUE CROSS/BLUE SHIELD

## 2022-05-12 ENCOUNTER — Ambulatory Visit: Payer: BLUE CROSS/BLUE SHIELD

## 2022-05-12 DIAGNOSIS — E1065 Type 1 diabetes mellitus with hyperglycemia: Secondary | ICD-10-CM

## 2022-05-12 MED ORDER — FREESTYLE LIBRE 3 SENSOR MISC
11 refills | Status: AC
Start: 2022-05-12 — End: ?

## 2022-05-12 MED ORDER — FREESTYLE LIBRE 3 READER DEVI
1 [IU] | Freq: Once | 0 refills | Status: AC
Start: 2022-05-12 — End: ?

## 2022-05-12 NOTE — Patient Instructions
Continuar Lantus 6 units en la manana    Para desayuno    Blood glucose   Humalog Insulin  101-200    4  Units  201-300    5 units  >301     6 units     Para Lonche y Cena    Blood glucose   Humalog Insulin  101-200    3  Units  201-300    4 units  >301     5 units

## 2022-05-12 NOTE — Progress Notes
PATIENT: Chad Watkins  MRN: [id number]  DOB: 1945-07-29  DATE OF SERVICE: 05/12/2022    REFERRING PRACTITIONER: Lubertha Sayres., MD   PRIMARY CARE PROVIDER: Trena Platt., DO    Reason for Consultation: Type 1 Diabetes Mellitus    Subjective:     History of Present Illness  Chad Watkins is a 77 y.o. male with type 1 diabetes mellitus. The patient presents for follow-up to the [facility].       Type 1 diabetes was diagnosed in 1989, and treated with insulin then.     He denies hx of DKA, but has had ''comas'' attributed to hypoglycemia that required paramedic visits, twice in July 2019.      Interval events:  Not using insulin at work due to fear lows. Again likes sugars to be higher.   Bolusing mostly when his sugar is high and he is going to eat.   Patient is testing BG 4x daily when not using CGM.   Patient uses insulin 4x daily.     When patient has CGM device, patient uses it consistently.   Patient tests blood glucose to calibrate their CGM.   Patient continues to dose insulin >3x/day and frequently adjusts insulin dosage based on the CGM device information.      Current DM Medications  Continuar Lantus 6 units en la manana    Para desayuno    Blood glucose   Humalog Insulin  101-200    4  Units  201-300    5 units  >301     6 units     Para Lonche y Cena    Blood glucose   Humalog Insulin  101-200    3  Units  201-300    4 units  >301     5 units     Glucose Review:  Data from glucose meter:        Dexcom Personal CGM Physician Interpretation Report  Dates Reviewed   (> 72 hours):  04/29/2022-05/12/2022   Avg Glucose:   240   % Serious Low (<54 mg/dL):  0   % Low (<95 mg/dL):  0   % In Target (62-130 mg/dL):  24   % High (865-784 mg/dL):  34   % Serious High (> 250 mg/dL):  42   Glucose variability:   33.2      In examining the patterns and providing interpretation, I noted variable fasting sugars due to variable blood sugars after meals.  No hypoglycemia trends. Having significant PP hyperglycemia due to insufficient insulin with meals and/or poor timing of insulin. Based on cgm interpretation, recommended using insulin more often before meals. Patient is interested in omnipod but not until it is compatible with his CGM Dexcom G7. Ok to defer until then.       Exercise:   Walks. On his feet 8 hrs at work all day.      Complications & Associated Co-morbidities Yes No Comments/Treatment to Date   Retinopathy  []    [x]      Nephropathy  []    [x]   Lab Results   Component Value Date    CREAT 0.84 08/17/2021    ALBCREATUR  01/22/2022      Comment:      Unable to calculate. Test result is below detection limit.       Neuropathy  []    [x]      Gastroparesis  []   [x]     CAD  []    [  x]     Stroke  []    [x]      PVD  []   [x]     Sleep Apnea  [x]    []   Not sure. Poor sleep?       Past Medical History  Past Medical History:   Diagnosis Date    Alcohol use disorder 09/15/2021    Aortic atherosclerosis (HCC/RAF) 03/13/2020    Colon polyps-2020 09/19/2019    Diabetic neuropathy (HCC/RAF) 01/27/2018    Diabetic peripheral vascular disease (HCC/RAF) 09/03/2017    DM type 1 with diabetic mixed hyperlipidemia (HCC/RAF) 09/03/2017    Edema leg 07/14/2018    Glaucoma     Suspect    History of fracture of left ankle 12/01/2017    Hypertension associated with diabetes (HCC/RAF) 09/03/2017    Long term (current) use of insulin (HCC/RAF) 09/19/2019    Peripheral vascular disease (HCC/RAF) 09/03/2017    Tortuous aorta (HCC/RAF) 03/13/2020    Trigger thumb 01/27/2018    Type 1 diabetes mellitus with diabetic neuropathy, unspecified (HCC/RAF) 09/03/2017       Past Surgical History  Past Surgical History:   Procedure Laterality Date    APPENDECTOMY      YAG CAPSULOTOMY Left 10/14/2020    YAG CAPSULOTOMY Right 11/11/2020       Medications  Current Outpatient Medications   Medication Sig    acetaminophen-codeine (TYLENOL #3) 300-30 mg tablet     acetaminophen-codeine (TYLENOL #3) 300-30 mg tablet Take 1-2 tablets by mouth every six (6) hours as needed. Max Daily Amount: 8 tablets    AMLODIPINE-BENAZEPRIL 5-20 mg capsule TAKE 1 CAPSULE BY MOUTH TWO (2) TIMES DAILY.    cephalexin 500 mg capsule Take 1 capsule (500 mg total) by mouth four (4) times daily.    ciclopirox 8% solution Apply topically at bedtime Apply over nail and surrounding skin.Marland Kitchen    FLUZONE HIGH-DOSE 0.5 ML syringe     furosemide 20 mg tablet Take 1-2 tablets (20-40 mg total) by mouth daily.    gatifloxacin 0.5% ophthalmic solution     glucagon (BAQSIMI TWO PACK) 3 mg/dose nasal powder 1 actuation (3 mg total) by Left Nare route every fifteen (15) minutes as needed.    glucose blood test strip Use as instructed    ibuprofen 600 mg tablet Take 1 tablet (600 mg total) by mouth every six (6) hours as needed.    insulin aspart, NovoLOG, (NOVOLOG FLEXPEN) 100 units/mL injection pen INJECT 0.02-0.08 MLS (2-8 UNITS TOTAL) UNDER THE SKIN THREE (3) TIMES DAILY BEFORE MEALS.    Insulin Disposable Pump (OMNIPOD 5 G6 INTRO, GEN 5,) KIT Use for insulin delivery..    Insulin Disposable Pump (OMNIPOD 5 G6 POD, GEN 5,) MISC Change every 2 days..    insulin glargine (BASAGLAR KWIKPEN) 100 units/mL injection pen Use 6 units daily. Waste 2 units to prime the needle.    insulin pen needle (B-D UF NANO PEN NEEDLES) 32G X 4 mm Check BS 6 times daily.  DX Type 1 DM on insulin [E10.1, Z79.4].    Insulin Pen Needle (BD AUTOSHIELD DUO) 30G X 5 MM MISC Use to inject insulin up to 6x daily.    LOTEMAX SM 0.38 % GEL     metFORMIN (GLUCOPHAGE XR) 500 mg PO ER 24 hr tablet TAKE 2 TABLETS (1,000 MG TOTAL) BY MOUTH TWICE A DAY WITH FOOD    ONETOUCH ULTRA test strip USE 1 TEST STRIP TESTING 5 TIMES A DAY.    PROLENSA 0.07 % SOLN  SHINGRIX 50 MCG/0.5ML injection     sildenafil 100 mg tablet Take 0.5-1 tablets (50-100 mg total) by mouth daily as needed for Erectile Dysfunction.    simvastatin (ZOCOR) 20 mg tablet TOME UNA TABLETA DIARIAMENTE    simvastatin 20 mg tablet Take 1 tablet (20 mg total) by mouth.    SIMVASTATIN 20 mg tablet TAKE 1 TABLET BY MOUTH EVERYDAY AT BEDTIME     No current facility-administered medications for this visit.     Allergies  Patient has no known allergies.    Family History  Family History   Problem Relation Age of Onset    Glaucoma Neg Hx     Macular degeneration Neg Hx        Social History  Social History     Socioeconomic History    Marital status: Married    Number of children: 4   Occupational History    Occupation: Soil scientist    Tobacco Use    Smoking status: Never    Smokeless tobacco: Never   Substance and Sexual Activity    Alcohol use: Yes     Alcohol/week: 4.2 oz     Types: 7 Standard drinks or equivalent per week       Objective:      Vitals: There were no vitals taken for this visit.   Wt Readings from Last 3 Encounters:   01/22/22 144 lb 12.8 oz (65.7 kg)   01/08/22 144 lb 3.2 oz (65.4 kg)   01/07/22 144 lb 3.2 oz (65.4 kg)            Additional Findings   General  [x]  NAD  [x]  Well nourished      Eyes  [x]  Moist conjunctivae   [x]  Anicteric sclera []  PERRL  [x]  No lid lag    HENT  [x]  Normocephalic  [x]  Atraumatic   []  oropharynx clear with moist mucous membranes  []  no caries    Neck  []  Neck supple    []  No JVD  []  No cervical fat pad     Thyroid  []  Normal size  []  No masses/nodules  []  Non-tender  []  No bruit    Lymph  []  No cervical LAD   []  No axillary LAD      Respiratory  []  CTABL  []  No wheezes  []  No crackles []  Normal effort    CV  []  RRR  []  Normal S1/S2  []  No m/r/g       []  Pedal pulses 2+  []  No carotid bruit  []  No LE edema     Abdomen:  []  Soft  []  Non-tender  []  No organomegaly  []  Normal BS    MSK  []  Normal gait  []  Normal ROM   []  Normal muscle tone  []  No joint tenderness    Neuro  []  Normal       monofilament  []  Normal vibratory sense  []  Normal strength  []  DTR 2+    Derm  []  No foot ulcers  [x]  No rashes   []  No lipohypertrophy      Psych  [x]  Appropriate affect  [x]  Alert and oriented x3          Lab Review:  Lab Results   Component Value Date    HGBA1C 8.2 (H) 08/17/2021    HGBA1C 8.3 06/24/2021    HGBA1C 7.0 01/22/2021      Lab Results   Component Value Date    GLUCOSE 253 (  H) 08/17/2021    ALBCREATUR  01/22/2022      Comment:      Unable to calculate. Test result is below detection limit.    CHOLDLQ 80 01/27/2018    CHOLDLCAL 55 08/17/2021    CPEPTIDE <0.2 (L) 09/03/2017      Lab Results   Component Value Date    CREAT 0.84 08/17/2021    BUN 17 08/17/2021    NA 129 (L) 08/17/2021    K 5.2 08/17/2021    CL 91 (L) 08/17/2021    CO2 25 08/17/2021      Lab Results   Component Value Date    WBC 7.75 08/17/2021    HGB 11.1 (L) 08/17/2021    HCT 33.0 (L) 08/17/2021    MCV 88.2 08/17/2021    PLT 315 08/17/2021      No results found for: ''OSMOLALITY''   Lab Results   Component Value Date    KETONESUR Negative 08/17/2021      No results found for: ''PHVEN'', ''PHART''   Lab Results   Component Value Date    CHOL 173 08/17/2021    CHOLHDL 109 08/17/2021    CHOLDLQ 80 01/27/2018    CHOLDLCAL 55 08/17/2021    TRIGLY 43 08/17/2021        Assessment/Plan:    77 y.o. male presenting to the [facility] with the following issues:    1. Type 1 Diabetes Mellitus  Lab Results   Component Value Date    HGBA1C 8.2 (H) 08/17/2021    HGBA1C 8.3 06/24/2021    HGBA1C 7.0 01/22/2021     Uncontrolled T1DM.  No known DM complications. Complicated by severe hypoglycemia.    A1C goal < 7.5%: to reduce risk of diabetes related complications to the eyes, feet and kidneys with additional goal of hypoglycemia risk reduction.  On CGM,  I noted variable fasting sugars due to variable blood sugars after meals.  No hypoglycemia trends. Having significant PP hyperglycemia due to insufficient insulin with meals and/or poor timing of insulin. Based on cgm interpretation, recommended using insulin more often before meals. Patient is interested in omnipod but not until it is compatible with his CGM Dexcom G7. Ok to defer until then.    -Please see new DM regimen below in patient instructions   -High frequency glucose monitoring with CGM. Ordered freestyle libre 3 reader and cgm as backup in case his dexcom fails.  More cost effective, out of pocket. Also emailed dexcom rep to see if there is any way that extra sensors can be mailed out when his sensors fail.    -Hypoglycemia protocol reviewed. ''Rule of 15'' handout given also. Carry glucose tabs at all times.   -Check glucose prior to driving. Glucose must be > 100 mg/dl. Reviewed risks related to driving with hypoglycemia   -Glucagon reviewed.  Baqsimi (glucagon) nasal spray if possible.  Ordered.  Waiting for coupon.     2. Hypertension   -Continue current regimen, including ACE-I inhibitor or ARB therapy    3. Hyperlipidemia   Per AHA guidelines, statin therapy is recommended with history of diabetes mellitus   -Continue statin therapy.    4. Diabetic microvascular disease screening:  Health Maintenance   Topic Date Due    Diabetes: EYE EXAM  11/19/2021    Diabetes: Hemoglobin A1c Blood Test  02/17/2022    Diabetes Kidney Monitoring: Urine Test  01/23/2023     5. ETOH use.  Hypoglycemia precautions  discussed with patient.     6. Sleep Disorder.  Dtr suspects OSA.  See PMD re: eval.      Renal function normal.   Lab Results   Component Value Date    GFRESTCKDEPI >89 08/17/2021    GFRESTCKDEPI 74 08/10/2021    GFRESTCKDEPI 84 09/09/2020         RTC in 4 monhts    Orders Placed This Encounter    POCT glycosylated hemoglobin (HgbA1C)    Continuous Blood Gluc Receiver (FREESTYLE LIBRE 3 READER) DEVI    Continuous Blood Gluc Sensor (FREESTYLE LIBRE 3 SENSOR) MISC     45 minutes were spent personally by me today on this encounter which include today's pre-visit review of the chart, obtaining appropriate history, performing an evaluation, documentation and discussion of management with details supported within the note for today's visit. The time documented was exclusive of any time spent on the separately billed procedure.      Author: Jeri Cos. Noreene Filbert, MD 05/12/2022 11:04 AM     Patient Instructions   Continuar Lantus 6 units en la manana    Para desayuno    Blood glucose   Humalog Insulin  101-200    4  Units  201-300    5 units  >301     6 units     Para Lonche y Cena    Blood glucose   Humalog Insulin  101-200    3  Units  201-300    4 units  >301     5 units

## 2022-05-13 ENCOUNTER — Ambulatory Visit: Payer: BLUE CROSS/BLUE SHIELD

## 2022-05-18 ENCOUNTER — Ambulatory Visit: Payer: BLUE CROSS/BLUE SHIELD

## 2022-05-22 ENCOUNTER — Telehealth: Payer: BLUE CROSS/BLUE SHIELD | Attending: Neurology

## 2022-05-22 DIAGNOSIS — G119 Hereditary ataxia, unspecified: Secondary | ICD-10-CM

## 2022-05-22 DIAGNOSIS — R55 Syncope and collapse: Secondary | ICD-10-CM

## 2022-05-22 DIAGNOSIS — R262 Difficulty in walking, not elsewhere classified: Secondary | ICD-10-CM

## 2022-05-22 DIAGNOSIS — E109 Type 1 diabetes mellitus without complications: Secondary | ICD-10-CM

## 2022-05-22 NOTE — Progress Notes
[facility] - HISTORY AND PHYSICAL     PATIENT:  Chad Watkins  MRN:  [id number]  DATE: 05/22/2022    REFERRING PRACTITIONER: No ref. provider found  PRIMARY CARE PROVIDER: Trena Platt., DO    Reason for consult:   Chief Complaint   Patient presents with    Follow-up         History of Present Illness:  Chad Watkins is a 77 y.o. male who  has a past medical history of Alcohol use disorder (09/15/2021), Aortic atherosclerosis (HCC/RAF) (03/13/2020), Colon polyps-2020 (09/19/2019), Diabetic neuropathy (HCC/RAF) (01/27/2018), Diabetic peripheral vascular disease (HCC/RAF) (09/03/2017), DM type 1 with diabetic mixed hyperlipidemia (HCC/RAF) (09/03/2017), Edema leg (07/14/2018), Glaucoma, History of fracture of left ankle (12/01/2017), Hypertension associated with diabetes (HCC/RAF) (09/03/2017), Long term (current) use of insulin (HCC/RAF) (09/19/2019), Peripheral vascular disease (HCC/RAF) (09/03/2017), Tortuous aorta (HCC/RAF) (03/13/2020), Trigger thumb (01/27/2018), and Type 1 diabetes mellitus with diabetic neuropathy, unspecified (HCC/RAF) (09/03/2017). Patient presents for the evaluation of gait imbalance and episodes of loss consciousness. Patient required Spanish interpretation services - [phone].     Reports he has had four big falls. The first two were described by patient: occurred about 3 months but not exactly sure. States he is not sure about what happened leading to the fall. Was with his family, and he had gone to bathroom for a bowel movement and when he came back he had fallen. Does not remember feel sick around the time of this fall. He did have loss of consciousness for a few minutes, does not report significantly confused/fatigued afterwards. No reported tongue lacerations or incontinence during this event.     The second fall occurred about 2 months ago (1 month after the initial fall). Reports at this time, he wanted to go to the bathroom but was unable to make in time. Was with wife at the time and was told he did not trip on anything. Did feel somewhat lightheaded beforehand and endorses feeling weak. Did not having any loss of consciousness during this episode but was incontinent of both urine and feces. Not sure what led to the fall.     Never had any other fainting spells apart from this first fall. No recent changes in his medications. Does admit to drinking the day before during at least one of these falls, possible both. Admit to drinking occasionally, but not much. Denies feeling lightheaded/dizzy with standing up. Denies waking incontinent in the bed or with tongue lacerations. Does report having difficulty making it to the bathroom in time. Was given prescription for PT given chronic gait impairment, but not yet started. No reported focal weakness or numbness but does feel clumsy in both of his feet.     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     Daughter showed ring camera footage of two other falls (unsure if these are the same falls that he had described):     During the first episode, he was sitting at the kitchen table (July 31st at 12:30 AM) when he gets up to check his blood sugar. Is seen stumbling, then stands steadying himself at the table. Starts stumbling backwards and has fall backwards.     During the second episode (in August), was standing at the kitchen counter preparing lettuce when he suddenly is seen stumbling with fall backwards. Family members go to help him, and they try standing him, and he is seen stumbling again losing  tone and falling backwards. Had to get stitches due to head injury. Does admit to unstable diabetes, with rapid fluctuations in his blood sugars, though he is unsure if the blood sugar was high or low during these episodes.     In both instances, he appears awake. Unclear if he is responsive or altered during the first event. Per daughter, was out of it for about 5 minutes after the second episode.     No major changes with memory or mood. No reported tremors. Did have headaches after these falls.    Interval History:    01/07/2022: patient presents for follow-up. Reports since last visit, had seen cardiologist and thus far has had normal stress test. No reported recurrence of syncopal events. Started PT which has been going well. Has been monitoring his blood sugar. Will intermittently feel lightheaded/dizzy with changing positions but last only few seconds to <1 minute.     05/22/2022: patient presents for telehealth follow-up. Doing overall very well, no further episodes. Does get dizzy if turning to quickly.      Patient Active Problem List    Diagnosis Date Noted    Alcohol use disorder 09/15/2021    Refractive error 02/13/2021     Release spectacle rx      Posterior capsular opacification 07/25/2020     Doing well s/p yag capsulotomy right eye on 11/11/2020  Doing well s/p yag capsulotomy left eye on 10/14/2020    F/u with Dr Excell Seltzer as scheduled      Ptosis of eyelid 07/25/2020     Consider oculoplastics consult if patient wishes      Controlled type 1 diabetes with mild nonproliferative retinopathy (HCC/RAF) 04/24/2020     No diabetic retinopathy seen today.  The importance of tight blood sugar (goal HgA1c <7) and blood pressure (<140/80) was emphasized. Patient understands the need for glucose monitoring/control, compliance with treatment and recommendations, and need for regular dilated fundus examinations, at least annually.      Aortic atherosclerosis (HCC/RAF) 03/13/2020    Tortuous aorta (HCC/RAF) 03/13/2020    Long term (current) use of insulin (HCC/RAF) 09/19/2019    Colon polyps-2020 09/19/2019    Mild nonproliferative diabetic retinopathy (HCC/RAF) 02/14/2019     None right eye.  Very minimal left eye.  Maintain good blood sugar and blood pressure under direction of diabetes doctor.          Pseudophakia 02/14/2019     Multifocal IOLs.  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  RNFL OCT 02/14/2019 normal OU.  04/24/2020 normal, both eyes.  -> repeat oct in 1 year.        Edema leg 07/14/2018    Diabetic neuropathy (HCC/RAF) 01/27/2018    Trigger thumb 01/27/2018    History of fracture of left ankle 12/01/2017    Hypertension associated with diabetes (HCC/RAF) 09/03/2017    DM type 1 with diabetic mixed hyperlipidemia (HCC/RAF) 09/03/2017    DM (diabetes mellitus), type 1, uncontrolled, periph vascular complic 09/03/2017    Peripheral vascular disease (HCC/RAF) 09/03/2017    DM (diabetes mellitus), type 1, uncontrolled w/neurologic complication 09/03/2017     IMO 10-19-20 Regulatory Update      Left ankle pain 04/09/2017    Abnormal EKG 08/23/2012    Palpitation 08/18/2012  Need for vaccination 08/18/2012    Healthcare maintenance 08/18/2012    Bursitis 06/05/2011    Skin cancer 06/05/2011     Possible, RT, ear         Past Medical History:   Diagnosis Date    Alcohol use disorder 09/15/2021    Aortic atherosclerosis (HCC/RAF) 03/13/2020    Colon polyps-2020 09/19/2019    Diabetic neuropathy (HCC/RAF) 01/27/2018    Diabetic peripheral vascular disease (HCC/RAF) 09/03/2017    DM type 1 with diabetic mixed hyperlipidemia (HCC/RAF) 09/03/2017    Edema leg 07/14/2018    Glaucoma     Suspect    History of fracture of left ankle 12/01/2017    Hypertension associated with diabetes (HCC/RAF) 09/03/2017    Long term (current) use of insulin (HCC/RAF) 09/19/2019    Peripheral vascular disease (HCC/RAF) 09/03/2017    Tortuous aorta (HCC/RAF) 03/13/2020    Trigger thumb 01/27/2018    Type 1 diabetes mellitus with diabetic neuropathy, unspecified (HCC/RAF) 09/03/2017       Medications that the patient states to be currently taking   Medication Sig    acetaminophen-codeine (TYLENOL #3) 300-30 mg tablet     acetaminophen-codeine (TYLENOL #3) 300-30 mg tablet Take 1-2 tablets by mouth every six (6) hours as needed. Max Daily Amount: 8 tablets    AMLODIPINE-BENAZEPRIL 5-20 mg capsule TAKE 1 CAPSULE BY MOUTH TWO (2) TIMES DAILY.    cephalexin 500 mg capsule Take 1 capsule (500 mg total) by mouth four (4) times daily.    ciclopirox 8% solution Apply topically at bedtime Apply over nail and surrounding skin..    Continuous Blood Gluc Sensor (FREESTYLE LIBRE 3 SENSOR) MISC Use sensor q14 days.    FLUZONE HIGH-DOSE 0.5 ML syringe     furosemide 20 mg tablet Take 1-2 tablets (20-40 mg total) by mouth daily.    gatifloxacin 0.5% ophthalmic solution     glucagon (BAQSIMI TWO PACK) 3 mg/dose nasal powder 1 actuation (3 mg total) by Left Nare route every fifteen (15) minutes as needed.    glucose blood test strip Use as instructed    ibuprofen 600 mg tablet Take 1 tablet (600 mg total) by mouth every six (6) hours as needed.    insulin aspart, NovoLOG, (NOVOLOG FLEXPEN) 100 units/mL injection pen INJECT 0.02-0.08 MLS (2-8 UNITS TOTAL) UNDER THE SKIN THREE (3) TIMES DAILY BEFORE MEALS.    Insulin Disposable Pump (OMNIPOD 5 G6 INTRO, GEN 5,) KIT Use for insulin delivery..    Insulin Disposable Pump (OMNIPOD 5 G6 POD, GEN 5,) MISC Change every 2 days..    insulin glargine (BASAGLAR KWIKPEN) 100 units/mL injection pen Use 6 units daily. Waste 2 units to prime the needle.    insulin pen needle (B-D UF NANO PEN NEEDLES) 32G X 4 mm Check BS 6 times daily.  DX Type 1 DM on insulin [E10.1, Z79.4].    Insulin Pen Needle (BD AUTOSHIELD DUO) 30G X 5 MM MISC Use to inject insulin up to 6x daily.    LOTEMAX SM 0.38 % GEL     metFORMIN (GLUCOPHAGE XR) 500 mg PO ER 24 hr tablet TAKE 2 TABLETS (1,000 MG TOTAL) BY MOUTH TWICE A DAY WITH FOOD    ONETOUCH ULTRA test strip USE 1 TEST STRIP TESTING 5 TIMES A DAY.    PROLENSA 0.07 % SOLN     SHINGRIX 50 MCG/0.5ML injection     sildenafil 100 mg tablet Take 0.5-1 tablets (50-100 mg total) by mouth daily as needed  for Erectile Dysfunction. simvastatin (ZOCOR) 20 mg tablet TOME UNA TABLETA DIARIAMENTE    simvastatin 20 mg tablet Take 1 tablet (20 mg total) by mouth.    SIMVASTATIN 20 mg tablet TAKE 1 TABLET BY MOUTH EVERYDAY AT BEDTIME         No Known Allergies     Family History   Problem Relation Age of Onset    Glaucoma Neg Hx     Macular degeneration Neg Hx        Social History     Socioeconomic History    Marital status: Married    Number of children: 4   Occupational History    Occupation: Soil scientist    Tobacco Use    Smoking status: Never    Smokeless tobacco: Never   Substance and Sexual Activity    Alcohol use: Yes     Alcohol/week: 4.2 oz     Types: 7 Standard drinks or equivalent per week        Review of Systems:  Other than noted above, a full 14-point review of systems was reviewed and is negative for: General, Eyes, ENMT, respiratory, cardiovascular, GI, GU, musculoskeletal, allergy/immunology, endocrinology, hematology, skin, neurologic, and psychiatric.      Objective:      There were no vitals filed for this visit.       Gen appearance: in NAD, conversational   HEENT; atraumatic head, normocephalic, anicteric sclera, PERRLA  CV: no edema  Resp: breathing comfortably on room air   Skin: No visible lesions or rashes  Psych: Normal affect, conversational     Neuro:  Orientation: patient is alert and oriented to person and situation   Attention: awake and alert; follows commands   Language: non aphasia nor dysarthria  CN 2: PERRLA, no visual field deficits   CN 3, 4, 6: EOMI, eyes conjugate, no nystagmus   CN 5: facial sensation intact   CN 7: no facial droop  CN 8: hearing intact to finger rub   CN 9, 10, 12: symmetric palate elevation; tongue midline   Tone: normal bulk and tone   Strength R/L:    Eye and lip closure   5   Deltoid   5/5   Biceps    5/5   Triceps    5/5   Wrist extension   5/5   Wrist flexion    5/5   FDI    5/5   ABP    5/5   IP    5/5   Hamstring    5/5   Quads    5/5   TA     5/5   Gastroc   5/5   Reflexes: 2+ biceps, brachioradialis, patellar   Movement: no tremor or abnormal movements   Sensation: no sensory deficits to LT   Coordination: no dysmetria on finger-to-nose  Gait: can stand unassisted, unable to tandem, has wide based gait              Lab Results   Component Value Date    WBC 7.75 08/17/2021    HGB 11.1 (L) 08/17/2021    HCT 33.0 (L) 08/17/2021    MCV 88.2 08/17/2021    PLT 315 08/17/2021     Lab Results   Component Value Date    CREAT 0.84 08/17/2021    BUN 17 08/17/2021    NA 129 (L) 08/17/2021    K 5.2 08/17/2021    CL 91 (L) 08/17/2021    CO2  25 08/17/2021     Lab Results   Component Value Date    HGBA1C 8.1 05/12/2022     Lab Results   Component Value Date    ALT 19 08/17/2021    AST 34 08/17/2021    ALKPHOS 69 08/17/2021    BILITOT 0.4 08/17/2021     Lab Results   Component Value Date    TSH 0.37 08/17/2021     Lab Results   Component Value Date    CALCIUM 10.0 08/17/2021    PHOS 2.8 08/10/2021     Lab Results   Component Value Date    CHOL 173 08/17/2021    CHOLHDL 109 08/17/2021    CHOLDLCAL 55 08/17/2021    CHOLDLQ 80 01/27/2018    TRIGLY 43 08/17/2021        Imaging:  No results found.    Neurologic Data:        Brain MRI 11/01/2021:  Focus of right cerebellar para vermian gliosis. A few white matter T2 hyperintensities, nonspecific though can reflect minimal chronic small vessel ischemic changes.     Brain MRA 11/01/2021:   1. 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   2. 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 A branch of the left M1 that supplies the superior division MCA territory originates at the neck of this aneurysm.   3. No areas of significant stenosis or occlusion.     Carotid Duplex 11/13/2021:  Mild, calcific plaque deposits in bilateral bifurcation and bilateral common carotid artery with stenosis less than 50%.       Routine EEG 12/05/2021:  This awake and drowsy EEG was normal. No epileptiform discharges or seizures were identified. However, interpretation was limited by frequent artifact.       Assessment:      Chad Watkins is a 77 y.o. male with history of recurrent falls, gait imbalance, and lapses in consciousness of unclear etiology. From video footage reviewed, patient is seen going from sitting to standing and stumbling somewhat before he completely loses his balance during one episodes, suggesting possibly a positional component. During the 2nd video, patient is already standing when he has sudden retropulsion leading to fall, and then when stood up appears to lose tone and fall back down to the ground. It is unclear if he was altered or confused during this first episodes as he was alone. Per daughter, he was somewhat confused for about 5 minutes with the second episode. Of note, he endorses having uncontrolled, insulin dependent diabetes and is unsure what his blood sugar was at the time of these episodes.    MRI brain was reviewed, and there was a small area of cerebellar gliosis which could theoretically cause the clumsiness and retropulsion, but would not explain the lapses in consciousness. MRA did show small aneurysms, which he reportedly had on prior studies and does not explain these events. He has not had cardiological evaluation or has any known history of arrhythmia, but another consideration would be cardiac syncope caused by an arrhythmia or heart block and so referral to cardiology was given.    EEG and carotid duplex done without significant abnormalities identified. We also discussed fall precautions, and getting up slowly, slow and steady steps, and avoiding impulsive movements to prevent falling. We discussed that if he does feel faint, to gently lower himself to the ground. If these episodes were to recur, family was asked to check his blood sugar and blood pressure, and perform some orienting  questions to see how responsive he is during an episodes. We also discussed initiating PT for gait and balance training. We also discussed possibly getting insulin pump which he was offered in the past but not interested. Will consider.     No diagnosis found.  Plan/ Recommendation:     - EEG WNL; can consider repeat if recurrence of episodes   - Carotid Duplex mild plaque, <50% bilaterally  - Cardiac work-up thus far normal; awaiting ziopatch results   - s/pPT for gait and balance training  - RTC 6 months for follow-up    The above recommendation were discussed with the patient.  The patient had all questions answered satisfactorily and is in agreement with this recommended     Thank you for asking me to participate in the care of this patient.    Total minutes spent today including review of tests/notes, obtaining history, performing exam, counseling/educating patient/family, ordering tests/medications, communicating with other physicians, charting, independently interpreting results and communicating test results to patient/family =  22 minutes    Author: Lucienne Minks A. Latondra Gebhart 05/22/2022 11:36 AM               Department of Neurology      [facility]

## 2022-06-05 ENCOUNTER — Telehealth: Payer: BLUE CROSS/BLUE SHIELD

## 2022-06-05 NOTE — Telephone Encounter
Patient picked up one G7 sensor. -[facility] 06/05/2022

## 2022-08-25 MED ORDER — SIMVASTATIN 20 MG PO TABS
ORAL_TABLET | 2 refills
Start: 2022-08-25 — End: ?

## 2022-08-26 MED ORDER — SIMVASTATIN 20 MG PO TABS
ORAL_TABLET | 2 refills | Status: AC
Start: 2022-08-26 — End: ?

## 2022-08-27 ENCOUNTER — Ambulatory Visit: Payer: BLUE CROSS/BLUE SHIELD

## 2022-09-05 MED ORDER — METFORMIN HCL ER 500 MG PO TB24
ORAL_TABLET | 3 refills | Status: AC
Start: 2022-09-05 — End: ?

## 2022-09-05 MED ORDER — AMLODIPINE BESY-BENAZEPRIL HCL 5-20 MG PO CAPS
1 | ORAL_CAPSULE | Freq: Two times a day (BID) | ORAL | 1 refills | 60.00000 days | Status: AC
Start: 2022-09-05 — End: ?

## 2022-09-05 MED ORDER — FUROSEMIDE 20 MG PO TABS
20-40 mg | ORAL_TABLET | Freq: Every day | ORAL | 3 refills | Status: AC
Start: 2022-09-05 — End: ?

## 2022-09-07 ENCOUNTER — Ambulatory Visit: Payer: BLUE CROSS/BLUE SHIELD

## 2022-09-07 MED ORDER — METFORMIN HCL ER 500 MG PO TB24
ORAL_TABLET | 3 refills | Status: AC
Start: 2022-09-07 — End: ?

## 2022-09-07 MED ORDER — FUROSEMIDE 20 MG PO TABS
20-40 mg | ORAL_TABLET | Freq: Every day | ORAL | 3 refills | Status: AC
Start: 2022-09-07 — End: ?

## 2022-09-07 MED ORDER — AMLODIPINE BESY-BENAZEPRIL HCL 5-20 MG PO CAPS
1 | ORAL_CAPSULE | Freq: Two times a day (BID) | ORAL | 1 refills | 60.00000 days | Status: AC
Start: 2022-09-07 — End: ?

## 2022-09-11 ENCOUNTER — Ambulatory Visit: Payer: BLUE CROSS/BLUE SHIELD

## 2022-09-11 DIAGNOSIS — E1042 Type 1 diabetes mellitus with diabetic polyneuropathy: Secondary | ICD-10-CM

## 2022-09-11 DIAGNOSIS — E103293 Type 1 diabetes mellitus with mild nonproliferative diabetic retinopathy without macular edema, bilateral: Secondary | ICD-10-CM

## 2022-09-11 DIAGNOSIS — E103292 Type 1 diabetes mellitus with mild nonproliferative diabetic retinopathy without macular edema, left eye: Secondary | ICD-10-CM

## 2022-09-11 NOTE — Progress Notes
PATIENT: Chad Watkins  MRN: [id number]  DOB: [birth year]  DATE OF SERVICE: 09/11/2022    CHIEF COMPLAINT:   Chief Complaint   Patient presents with    Annual Exam     77 years old male       Past Medical History:   Diagnosis Date    Alcohol use disorder 09/15/2021    Aortic atherosclerosis (HCC/RAF) 03/13/2020    Colon polyps-2020 09/19/2019    Diabetic neuropathy (HCC/RAF) 01/27/2018    Diabetic peripheral vascular disease (HCC/RAF) 09/03/2017    DM type 1 with diabetic mixed hyperlipidemia (HCC/RAF) 09/03/2017    Edema leg 07/14/2018    Glaucoma     Suspect    History of fracture of left ankle 12/01/2017    Hypertension associated with diabetes (HCC/RAF) 09/03/2017    Long term (current) use of insulin (HCC/RAF) 09/19/2019    Peripheral vascular disease (HCC/RAF) 09/03/2017    Tortuous aorta (HCC/RAF) 03/13/2020    Trigger thumb 01/27/2018    Type 1 diabetes mellitus with diabetic neuropathy, unspecified (HCC/RAF) 09/03/2017       Past Surgical History:   Procedure Laterality Date    APPENDECTOMY      YAG CAPSULOTOMY Left 10/14/2020    YAG CAPSULOTOMY Right 11/11/2020       Social History     Tobacco Use   Smoking Status Never   Smokeless Tobacco Never       Family History   Problem Relation Age of Onset    Glaucoma Neg Hx     Macular degeneration Neg Hx        Social History     Substance and Sexual Activity   Alcohol Use Yes    Alcohol/week: 4.2 oz    Types: 7 Standard drinks or equivalent per week       Social History     Substance and Sexual Activity   Drug Use Not on file       No Known Allergies    Patient Active Problem List   Diagnosis    Bursitis    Skin cancer    Palpitation    Need for vaccination    Healthcare maintenance    Abnormal EKG    Left ankle pain    Hypertension associated with diabetes (HCC/RAF)    DM type 1 with diabetic mixed hyperlipidemia (HCC/RAF)    Diabetic peripheral vascular disease (HCC/RAF)    Peripheral vascular disease (HCC/RAF)    DM (diabetes mellitus), type 1, uncontrolled w/neurologic complication    History of fracture of left ankle    Diabetic neuropathy (HCC/RAF)    Trigger thumb    Edema leg    Mild nonproliferative diabetic retinopathy (HCC/RAF)    Pseudophakia    Phyisological cupping of optic disc, right    Long term (current) use of insulin (HCC/RAF)    Colon polyps-2020    Aortic atherosclerosis (HCC/RAF)    Tortuous aorta (HCC/RAF)    Controlled type 1 diabetes with mild nonproliferative retinopathy (HCC/RAF)    Posterior capsular opacification    Ptosis of eyelid    Refractive error    Alcohol use disorder         Current Outpatient Medications:     acetaminophen-codeine (TYLENOL #3) 300-30 mg tablet, , Disp: , Rfl: 0    acetaminophen-codeine (TYLENOL #3) 300-30 mg tablet, Take 1-2 tablets by mouth every six (6) hours as needed. Max Daily Amount: 8 tablets, Disp: 15 tablet, Rfl: 0    AMLODIPINE-BENAZEPRIL 5-20 mg  capsule, TAKE 1 CAPSULE BY MOUTH TWO (2) TIMES DAILY., Disp: 180 capsule, Rfl: 1    FUROSEMIDE 20 mg tablet, TAKE 1-2 TABLETS (20-40 MG TOTAL) BY MOUTH DAILY., Disp: 180 tablet, Rfl: 3    gatifloxacin 0.5% ophthalmic solution, , Disp: , Rfl:     glucagon (BAQSIMI TWO PACK) 3 mg/dose nasal powder, 1 actuation (3 mg total) by Left Nare route every fifteen (15) minutes as needed., Disp: 3 each, Rfl: 1    glucose blood test strip, Use as instructed, Disp: 100 each, Rfl: 12    ibuprofen 600 mg tablet, Take 1 tablet (600 mg total) by mouth every six (6) hours as needed., Disp: 30 tablet, Rfl: 0    insulin aspart, NovoLOG, (NOVOLOG FLEXPEN) 100 units/mL injection pen, INJECT 0.02-0.08 MLS (2-8 UNITS TOTAL) UNDER THE SKIN THREE (3) TIMES DAILY BEFORE MEALS., Disp: 30 mL, Rfl: 4    Insulin Disposable Pump (OMNIPOD 5 G6 INTRO, GEN 5,) KIT, Use for insulin delivery.., Disp: 1 kit, Rfl: 0    Insulin Disposable Pump (OMNIPOD 5 G6 POD, GEN 5,) MISC, Change every 2 days.., Disp: 45 each, Rfl: 4    insulin glargine (BASAGLAR KWIKPEN) 100 units/mL injection pen, Use 6 units daily. Waste 2 units to prime the needle., Disp: 9 mL, Rfl: 9    insulin pen needle (B-D UF NANO PEN NEEDLES) 32G X 4 mm, Check BS 6 times daily.  DX Type 1 DM on insulin [E10.1, Z79.4]., Disp: 600 each, Rfl: 9    Insulin Pen Needle (BD AUTOSHIELD DUO) 30G X 5 MM MISC, Use to inject insulin up to 6x daily., Disp: 500 each, Rfl: 9    LOTEMAX SM 0.38 % GEL, , Disp: , Rfl:     metFORMIN (GLUCOPHAGE XR) 500 mg PO ER 24 hr tablet, TAKE 2 TABLETS (1,000 MG TOTAL) BY MOUTH TWICE A DAY WITH FOOD, Disp: 360 tablet, Rfl: 3    ONETOUCH ULTRA test strip, USE 1 TEST STRIP TESTING 5 TIMES A DAY., Disp: 500 strip, Rfl: 2    PROLENSA 0.07 % SOLN, , Disp: , Rfl:     sildenafil 100 mg tablet, Take 0.5-1 tablets (50-100 mg total) by mouth daily as needed for Erectile Dysfunction., Disp: 10 tablet, Rfl: 5    simvastatin 20 mg tablet, Take 1 tablet (20 mg total) by mouth., Disp: , Rfl:     cephalexin 500 mg capsule, Take 1 capsule (500 mg total) by mouth four (4) times daily. (Patient not taking: Reported on 09/11/2022.), Disp: 40 capsule, Rfl: 0    ciclopirox 8% solution, Apply topically at bedtime Apply over nail and surrounding skin.. (Patient not taking: Reported on 09/11/2022.), Disp: 1 bottle, Rfl: 3    Continuous Blood Gluc Sensor (FREESTYLE LIBRE 3 SENSOR) MISC, Use sensor q14 days., Disp: 2 each, Rfl: 11    FLUZONE HIGH-DOSE 0.5 ML syringe, , Disp: , Rfl:     SHINGRIX 50 MCG/0.5ML injection, , Disp: , Rfl:     simvastatin (ZOCOR) 20 mg tablet, TOME UNA TABLETA DIARIAMENTE, Disp: 90 tablet, Rfl: 3    SIMVASTATIN 20 mg tablet, take 1 tablet by mouth everyday at bedtime, Disp: 90 tablet, Rfl: 2    Health Maintenance   Topic Date Due    Hepatitis B Screening  Never done    Advance Directive  Never done    Preventive Wellness Visit  09/18/2020    Diabetes: Eye Exam  11/19/2021    COVID-19 Vaccine(Tracks primary and booster doses, not sup/immunocomp) (  6 - 2023-24 season) 05/08/2022    Influenza Vaccine (1) 09/20/2022    Diabetes: HGB A1C 03/08/2023    Diabetes: URINE ALBUMIN-CREATININE RATIO MONITORING  09/05/2023    Diabetes: eGFR MONITORING  09/05/2023    Tdap/Td Vaccine (3 - Td or Tdap) 11/13/2029    Pneumococcal Vaccine  Completed    Hepatitis C Screening  Completed    Shingles (Shingrix) Vaccine  Completed    Statin prescribed for ASCVD Prevention or Treatment  Completed    Colorectal Cancer Screening  Discontinued       Patient Care Team:  Trena Platt., DO as PCP - General (Family Medicine)      Subjective:      Chad Watkins is a 77 y.o. male.    Review of Systems   Constitutional: Negative.  Negative for activity change, appetite change, chills, diaphoresis, fatigue, fever and unexpected weight change.   HENT: Negative.  Negative for congestion, dental problem, drooling, ear discharge, ear pain, facial swelling, hearing loss, mouth sores, nosebleeds, postnasal drip, rhinorrhea, sinus pressure, sinus pain, sneezing, sore throat, tinnitus, trouble swallowing and voice change.    Eyes: Negative.  Negative for photophobia, pain, discharge, redness, itching and visual disturbance.   Respiratory: Negative.  Negative for apnea, cough, choking, chest tightness, shortness of breath, wheezing and stridor.    Cardiovascular: Negative.  Negative for chest pain, palpitations and leg swelling.   Gastrointestinal: Negative.  Negative for abdominal distention, abdominal pain, anal bleeding, blood in stool, constipation, diarrhea, nausea, rectal pain and vomiting.   Endocrine: Negative.  Negative for cold intolerance, heat intolerance, polydipsia, polyphagia and polyuria.   Genitourinary: Negative.  Negative for decreased urine volume, difficulty urinating, dysuria, enuresis, flank pain, frequency, genital sores, hematuria, penile discharge, penile pain, penile swelling, scrotal swelling, testicular pain and urgency.   Musculoskeletal: Negative.  Negative for arthralgias, back pain, gait problem, joint swelling, myalgias, neck pain and neck stiffness.   Skin: Negative.  Negative for color change, pallor, rash and wound.   Allergic/Immunologic: Negative.  Negative for environmental allergies, food allergies and immunocompromised state.   Neurological: Negative.  Negative for dizziness, tremors, seizures, syncope, facial asymmetry, speech difficulty, weakness, light-headedness, numbness and headaches.   Hematological: Negative.  Negative for adenopathy. Does not bruise/bleed easily.   Psychiatric/Behavioral: Negative.  Negative for agitation, behavioral problems, confusion, decreased concentration, dysphoric mood, hallucinations, self-injury, sleep disturbance and suicidal ideas. The patient is not nervous/anxious and is not hyperactive.    All other systems reviewed and are negative.        Objective:      Physical Exam  Vitals reviewed.   Constitutional:       General: He is not in acute distress.     Appearance: Normal appearance. He is well-developed and normal weight. He is not ill-appearing, toxic-appearing or diaphoretic.      Comments: BP 110/53  ~ Pulse 71  ~ Temp 36.4 ?C (97.6 ?F) (Tympanic)  ~ Ht 5' 4'' (1.626 m)  ~ Wt 141 lb 12.8 oz (64.3 kg)  ~ SpO2 96%  ~ BMI 24.34 kg/m?        HENT:      Head: Normocephalic and atraumatic.      Right Ear: Tympanic membrane, ear canal and external ear normal.      Left Ear: Tympanic membrane, ear canal and external ear normal.      Nose: Nose normal. No congestion or rhinorrhea.      Mouth/Throat:  Mouth: Mucous membranes are moist.      Pharynx: Oropharynx is clear. No oropharyngeal exudate or posterior oropharyngeal erythema.   Eyes:      General: No scleral icterus.        Right eye: No discharge.         Left eye: No discharge.      Extraocular Movements: Extraocular movements intact.      Conjunctiva/sclera: Conjunctivae normal.      Pupils: Pupils are equal, round, and reactive to light.   Neck:      Thyroid: No thyromegaly.      Vascular: No carotid bruit.   Cardiovascular:      Rate and Rhythm: Normal rate and regular rhythm.      Pulses: Normal pulses.      Heart sounds: Normal heart sounds. No murmur heard.     No friction rub. No gallop.   Pulmonary:      Effort: Pulmonary effort is normal. No respiratory distress.      Breath sounds: Normal breath sounds. No stridor. No wheezing, rhonchi or rales.   Chest:      Chest wall: No tenderness.   Abdominal:      General: Bowel sounds are normal. There is no distension.      Palpations: Abdomen is soft. There is no mass.      Tenderness: There is no abdominal tenderness. There is no guarding or rebound.      Hernia: No hernia is present.   Musculoskeletal:         General: No swelling, tenderness, deformity or signs of injury. Normal range of motion.      Cervical back: Normal range of motion and neck supple. No rigidity or tenderness. No muscular tenderness.      Right lower leg: No edema.      Left lower leg: No edema.   Lymphadenopathy:      Cervical: No cervical adenopathy.   Skin:     General: Skin is warm and dry.      Capillary Refill: Capillary refill takes less than 2 seconds.      Coloration: Skin is not jaundiced or pale.      Findings: No bruising, erythema, lesion or rash.   Neurological:      General: No focal deficit present.      Mental Status: He is alert and oriented to person, place, and time.      Cranial Nerves: No cranial nerve deficit.      Sensory: No sensory deficit.      Motor: No weakness or abnormal muscle tone.      Coordination: Coordination normal.      Gait: Gait normal.      Deep Tendon Reflexes: Reflexes normal.   Psychiatric:         Mood and Affect: Mood normal.         Behavior: Behavior normal.         Thought Content: Thought content normal.         Judgment: Judgment normal.             ASSESSMENT AND PLAN     1. Healthcare maintenance    2. Diabetic polyneuropathy associated with type 1 diabetes mellitus (HCC/RAF)    3. Alcohol use disorder    4. 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 Long term (current) use of insulin (HCC/RAF)  6. Diabetic peripheral vascular disease (HCC/RAF)    7. Controlled type 1 diabetes mellitus with both eyes affected by mild nonproliferative retinopathy without macular edema (HCC/RAF)    8. DM (diabetes mellitus), type 1, uncontrolled w/neurologic complication    9. Tortuous aorta (HCC/RAF)    10. Peripheral vascular disease (HCC/RAF)    11. Hypertension associated with diabetes (HCC/RAF)    12. DM type 1 with diabetic mixed hyperlipidemia (HCC/RAF)    13. Aortic atherosclerosis (HCC/RAF)        No orders of the defined types were placed in this encounter.      Wt Readings from Last 5 Encounters:   05/12/22 144 lb 6.4 oz (65.5 kg)   01/22/22 144 lb 12.8 oz (65.7 kg)   01/08/22 144 lb 3.2 oz (65.4 kg)   01/07/22 144 lb 3.2 oz (65.4 kg)   12/02/21 146 lb 6.4 oz (66.4 kg)     BP Readings from Last 5 Encounters:   05/12/22 100/42   01/22/22 139/69   01/08/22 115/49   01/07/22 105/50   12/02/21 124/60       Hgb A1c   Date Value Ref Range Status   09/05/2022 8.9 (H) <5.7 % Final     Comment:     For patients with diabetes, an A1c less than (<) or equal (=) to 7.0% is recommended for most patients, however the goal may be higher or lower depending on age and/or other medical problems.     For a diagnosis of diabetes, A1c greater than (>) or equal(=) to 6.5% indicates diabetes; values between 5.7% and 6.4% may indicate an increased risk of developing diabetes.    Hemoglobin A1c is determined with Roche Cobas immunoassay. The presence of abnormal hemoglobin variants, Thalassemia, and increased concentration of HbF (>7%), may result in lower HbA1c values.   08/17/2021 8.2 (H) <5.7 % Final     Comment:     For patients with diabetes, an A1c less than (<) or equal (=) to 7.0% is recommended for most patients, however the goal may be higher or lower depending on age and/or other medical problems.   For a diagnosis of diabetes, A1c greater than (>) or equal(=) to 6.5% indicates diabetes; values between 5.7% and 6.4% may indicate an increased risk of developing diabetes.   09/09/2020 7.9 (H) <5.7 % Final     Comment:     For patients with diabetes, an A1c less than (<) or equal (=) to 7.0% is recommended for most patients, however the goal may be higher or lower depending on age and/or other medical problems.   For a diagnosis of diabetes, A1c greater than (>) or equal(=) to 6.5% indicates diabetes; values between 5.7% and 6.4% may indicate an increased risk of developing diabetes.     Hemoglobin A1C, Manual   Date Value Ref Range Status   05/12/2022 8.1 (Ref Range:4.2-6.5%) Note: For patients with diabetes, an A1c less than (<) or equal (=) to 7.0% is recommended for most patients, however the goal may be higher or lower depending on age and/or other medical problems. % Final   06/24/2021 8.3 (Ref Range:4.2-6.5%) Note: For patients with diabetes, an A1c less than (<) or equal (=) to 7.0% is recommended for most patients, however the goal may be higher or lower depending on age and/or other medical problems. % Final   01/22/2021 7.0 (Ref Range:4.2-6.5%) Note: For patients with diabetes, an A1c less than (<) or equal (=) to 7.0% is recommended for  most patients, however the goal may be higher or lower depending on age and/or other medical problems. % Final     Cholesterol   Date Value Ref Range Status   09/05/2022 186 See Comment mg/dL Final     Comment:     The significance of total cholesterol depends on the values of LDL, HDL, triglycerides and the clinical context. A patient-provider discussion may be considered.       08/17/2021 173 See Comment mg/dL Final     Comment:     The significance of total cholesterol depends on the values of LDL, HDL, triglycerides and the clinical context. A patient-provider discussion may be considered.       09/09/2020 192 See Comment mg/dL Final     Comment:     The significance of total cholesterol depends on the values of LDL, HDL, triglycerides and the clinical context. A patient-provider discussion may be considered.         Cholesterol,LDL,Calc   Date Value Ref Range Status   09/05/2022 57 <100 mg/dL Final     Comment:     Patient is non-fasting, interpret with caution.  If LDL value falls outside of the designated range AND if  included in any of the following categories, a  patient-provider discussion is recommended.     Statin therapy is recommended for individuals:  1. with clinical atherosclerotic cardiovascular disease     irrespective of LDL levels;  2. with LDL > or = 190 mg/dL;  3. with diabetes, aged 40-75 years, with LDL between 70 and     189 mg/dL;  4. without any of the above but who have LDL between 70 and     189 mg/dL and an estimated 84-XLKG risk of     atherosclerotic cardiovascular disease > or = 7.5%     (consider statin therapy if estimated 10-year risk > or =     5.0%) (ACC/AHA 2013 Guidelines).   08/17/2021 55 <100 mg/dL Final     Comment:     Patient is non-fasting, interpret with caution.  If LDL value falls outside of the designated range AND if  included in any of the following categories, a  patient-provider discussion is recommended.     Statin therapy is recommended for individuals:  1. with clinical atherosclerotic cardiovascular disease     irrespective of LDL levels;  2. with LDL > or = 190 mg/dL;  3. with diabetes, aged 40-75 years, with LDL between 70 and     189 mg/dL;  4. without any of the above but who have LDL between 70 and     189 mg/dL and an estimated 40-NUUV risk of     atherosclerotic cardiovascular disease > or = 7.5%     (consider statin therapy if estimated 10-year risk > or =     5.0%) (ACC/AHA 2013 Guidelines).   09/09/2020 50 <100 mg/dL Final     Comment:     Patient is non-fasting, interpret with caution.  If LDL value falls outside of the designated range AND if  included in any of the following categories, a  patient-provider discussion is recommended.     Statin therapy is recommended for individuals:  1. with clinical atherosclerotic cardiovascular disease     irrespective of LDL levels;  2. with LDL > or = 190 mg/dL;  3. with diabetes, aged 40-75 years, with LDL between 70 and     189 mg/dL;  4. without any of the above  but who have LDL between 70 and     189 mg/dL and an estimated 81-XBJY risk of     atherosclerotic cardiovascular disease > or = 7.5%     (consider statin therapy if estimated 10-year risk > or =     5.0%) (ACC/AHA 2013 Guidelines).     Cholesterol, HDL   Date Value Ref Range Status   09/05/2022 119 >40 mg/dL Final     Comment:     If HDL cholesterol level falls outside of the designated  range, a patient-provider discussion is recommended   08/17/2021 109 >40 mg/dL Final     Comment:     If HDL cholesterol level falls outside of the designated  range, a patient-provider discussion is recommended   09/09/2020 129 >40 mg/dL Final     Comment:     If HDL cholesterol level falls outside of the designated  range, a patient-provider discussion is recommended     Triglycerides   Date Value Ref Range Status   09/05/2022 49 <150 mg/dL Final     Comment:     Patient is non-fasting, interpret with caution.  If Triglyceride level falls outside of the designated range,  a patient-provider discussion is recommended.     08/17/2021 43 <150 mg/dL Final     Comment:     Patient is non-fasting, interpret with caution.  If Triglyceride level falls outside of the designated range,  a patient-provider discussion is recommended.     09/09/2020 63 <150 mg/dL Final     Comment:     Patient is non-fasting, interpret with caution.  If Triglyceride level falls outside of the designated range,  a patient-provider discussion is recommended.        Creatinine   Date Value Ref Range Status   09/05/2022 1.09 0.60 - 1.30 mg/dL Final   78/29/5621 3.08 0.60 - 1.30 mg/dL Final   65/78/4696 2.95 0.60 - 1.30 mg/dL Final        PHQ-9 Results      10/19/2014     9:38 AM 09/11/2015    10:53 AM 12/24/2016    10:44 AM 01/22/2017    11:45 AM 01/27/2018     9:06 AM 09/19/2019     9:58 AM 09/11/2022    12:30 PM   Depression Screening (Patient Health Questionnaire PHQ)   PHQ-2: Feeling down, depressed, or hopeless No No No No No No Not at all   PHQ-2: Little interest or pleassure in doing things No No No No No No Not at all       GAD-7 Results       No data to display                DAST Results       No data to display                Audit-C results       No data to display                  No follow-ups on file.  The above plan of care, diagnosis, orders, and follow-up were discussed with the patient.  Questions related to this recommended plan of care were answered.  Lonzo Cloud. Karolee Ohs, DO  09/11/2022    Author:  Lonzo Cloud. Ieshia Hatcher 09/11/2022 12:33 PM

## 2022-09-20 MED ORDER — BD PEN NEEDLE NANO U/F 32G X 4 MM MISC
9 refills | 30.00000 days | Status: AC
Start: 2022-09-20 — End: ?

## 2022-09-20 MED ORDER — ONETOUCH ULTRA VI STRP
ORAL_STRIP | 2 refills | Status: AC
Start: 2022-09-20 — End: ?

## 2022-09-22 MED ORDER — BD PEN NEEDLE NANO U/F 32G X 4 MM MISC
9 refills | Status: AC
Start: 2022-09-22 — End: ?

## 2022-09-28 MED ORDER — ONETOUCH ULTRA VI STRP
1 | ORAL_STRIP | Freq: Every day | 0 refills | 30.00000 days | Status: AC
Start: 2022-09-28 — End: ?

## 2022-10-26 MED ORDER — FUROSEMIDE 20 MG PO TABS
20 mg | ORAL_TABLET | Freq: Every day | ORAL | 3 refills | Status: AC
Start: 2022-10-26 — End: ?

## 2022-11-01 MED ORDER — FUROSEMIDE 20 MG PO TABS
20 mg | ORAL_TABLET | Freq: Every day | ORAL | 3 refills | Status: AC
Start: 2022-11-01 — End: ?

## 2022-11-12 ENCOUNTER — Telehealth: Payer: BLUE CROSS/BLUE SHIELD

## 2022-11-12 ENCOUNTER — Ambulatory Visit: Payer: BLUE CROSS/BLUE SHIELD

## 2022-11-12 NOTE — Telephone Encounter
Unable to perform Optos, patient cannot open eye wide enough to take photo.

## 2022-11-12 NOTE — Progress Notes
PATIENT: Chad Watkins  MRN: [id number]  DOB: 10-30-1945  DATE OF SERVICE: 11/12/2022    REFERRING PRACTITIONER: Lubertha Sayres., MD   PRIMARY CARE PROVIDER: Trena Platt., DO    Reason for Consultation: Type 1 Diabetes Mellitus    Subjective:     History of Present Illness  Chad Watkins is a 77 y.o. male with type 1 diabetes mellitus. The patient presents for follow-up to the [facility].       Type 1 diabetes was diagnosed in 1989, and treated with insulin then.     He denies hx of DKA, but has had ''comas'' attributed to hypoglycemia that required paramedic visits, twice in July 2019.      Interval events:  Not using insulin at work due to fear lows. Again likes sugars to be higher.   Again bolusing mostly when his sugar is high and he is going to eat. Working on timing.   Patient is testing BG 4x daily when not using CGM.   Patient uses insulin 4x daily.     When patient has CGM device, patient uses it consistently.   Patient tests blood glucose to calibrate their CGM.   Patient continues to dose insulin >3x/day and frequently adjusts insulin dosage based on the CGM device information.      Current DM Medications  Continuar Lantus 6 units en la manana    Para desayuno    Blood glucose   Humalog Insulin  101-200    4  Units  201-300    5 units  >301     6 units     Para Lonche y Cena    Blood glucose   Humalog Insulin  101-200    3  Units  201-300    4 units  >301     5 units     Glucose Review:  Data from glucose meter:        Dexcom Personal CGM Physician Interpretation Report  Dates Reviewed   (> 72 hours):  10/30/2022-11/12/2022   Avg Glucose:  205   % Serious Low (<54 mg/dL):  0   % Low (<78 mg/dL):  0   % In Target (46-962 mg/dL): 39   % High (952-841 mg/dL): 36   % Serious High (> 250 mg/dL): 24   Glucose variability:  35.9     In examining the patterns and providing interpretation the following was noted: Normal fasting sugars. Post-prandial hyperglycemia after meals due to insufficient insulin with meals or higher carb intake. Postprandial hypoglycemia due to excess mealtime insulin or exercise before meals. Based on CGM interpetration, Continue same regimen. Continue to learn how to count carbohydrates. Discussed proper timing of insulin in relation to food to minimize pp hyperglycemia. Discussed hypoglycemia treatment and rule of 15. Recommend record keeping including carbohydrates, meals, and insulin doses.        Exercise:   Walks. On his feet 8 hrs at work all day.      Complications & Associated Co-morbidities Yes No Comments/Treatment to Date   Retinopathy  []    [x]      Nephropathy  []    [x]   Lab Results   Component Value Date    CREAT 1.09 09/05/2022    ALBCREATUR  09/05/2022      Comment:      Unable to calculate. Test result is below detection limit.       Neuropathy  []    [x]   Gastroparesis  []   [x]     CAD  []    [x]      Stroke  []    [x]      PVD  []   [x]     Sleep Apnea  [x]    []   Not sure. Poor sleep?       Past Medical History  Past Medical History:   Diagnosis Date    Alcohol use disorder 09/15/2021    Aortic atherosclerosis (HCC/RAF) 03/13/2020    Colon polyps-2020 09/19/2019    Diabetic neuropathy (HCC/RAF) 01/27/2018    Diabetic peripheral vascular disease (HCC/RAF) 09/03/2017    DM type 1 with diabetic mixed hyperlipidemia (HCC/RAF) 09/03/2017    Edema leg 07/14/2018    Glaucoma     Suspect    History of fracture of left ankle 12/01/2017    Hypertension associated with diabetes (HCC/RAF) 09/03/2017    Long term (current) use of insulin (HCC/RAF) 09/19/2019    Peripheral vascular disease (HCC/RAF) 09/03/2017    Tortuous aorta (HCC/RAF) 03/13/2020    Trigger thumb 01/27/2018    Type 1 diabetes mellitus with diabetic neuropathy, unspecified (HCC/RAF) 09/03/2017       Past Surgical History  Past Surgical History:   Procedure Laterality Date    APPENDECTOMY      YAG CAPSULOTOMY Left 10/14/2020    YAG CAPSULOTOMY Right 11/11/2020       Medications  Current Outpatient Medications   Medication Sig    Continuous Blood Gluc Sensor (FREESTYLE LIBRE 3 SENSOR) MISC Use sensor q14 days.    FUROSEMIDE 20 mg tablet take 1 tablet by mouth every day    glucagon (BAQSIMI TWO PACK) 3 mg/dose nasal powder 1 actuation (3 mg total) by Left Nare route every fifteen (15) minutes as needed.    glucose blood (ONETOUCH ULTRA) test strip 1 strip by use with Diabetic testing device route daily Use as instructed.    glucose blood test strip Use as instructed    insulin aspart, NovoLOG, (NOVOLOG FLEXPEN) 100 units/mL injection pen INJECT 0.02-0.08 MLS (2-8 UNITS TOTAL) UNDER THE SKIN THREE (3) TIMES DAILY BEFORE MEALS.    insulin glargine (BASAGLAR KWIKPEN) 100 units/mL injection pen Use 6 units daily. Waste 2 units to prime the needle.    insulin pen needle (B-D UF NANO PEN NEEDLES) 32G X 4 mm Check BS 6 times daily.  DX Type 1 DM on insulin [E10.1, Z79.4].    metFORMIN (GLUCOPHAGE XR) 500 mg PO ER 24 hr tablet TAKE 2 TABLETS (1,000 MG TOTAL) BY MOUTH TWICE A DAY WITH FOOD    PROLENSA 0.07 % SOLN     sildenafil 100 mg tablet Take 0.5-1 tablets (50-100 mg total) by mouth daily as needed for Erectile Dysfunction.    simvastatin (ZOCOR) 20 mg tablet TOME UNA TABLETA DIARIAMENTE     No current facility-administered medications for this visit.     Allergies  Patient has no known allergies.    Family History  Family History   Problem Relation Age of Onset    Glaucoma Neg Hx     Macular degeneration Neg Hx        Social History  Social History     Socioeconomic History    Marital status: Married    Number of children: 4   Occupational History    Occupation: Soil scientist    Tobacco Use    Smoking status: Never    Smokeless tobacco: Never   Substance and Sexual Activity    Alcohol use: Yes  Alcohol/week: 4.2 oz     Types: 7 Standard drinks or equivalent per week       Objective:      Vitals: BP 115/49  ~ Pulse 66  ~ Resp 18  ~ Ht 5' 7'' (1.702 m)  ~ Wt 145 lb 6.4 oz (66 kg)  ~ SpO2 96%  ~ BMI 22.77 kg/m?    Wt Readings from Last 3 Encounters:   11/12/22 145 lb 6.4 oz (66 kg)   09/11/22 141 lb 12.8 oz (64.3 kg)   05/12/22 144 lb 6.4 oz (65.5 kg)            Additional Findings   General  [x]  NAD  [x]  Well nourished      Eyes  [x]  Moist conjunctivae   [x]  Anicteric sclera []  PERRL  [x]  No lid lag    HENT  [x]  Normocephalic  [x]  Atraumatic   []  oropharynx clear with moist mucous membranes  []  no caries    Neck  []  Neck supple    []  No JVD  []  No cervical fat pad     Thyroid  []  Normal size  []  No masses/nodules  []  Non-tender  []  No bruit    Lymph  []  No cervical LAD   []  No axillary LAD      Respiratory  []  CTABL  []  No wheezes  []  No crackles []  Normal effort    CV  []  RRR  []  Normal S1/S2  []  No m/r/g       []  Pedal pulses 2+  []  No carotid bruit  []  No LE edema     Abdomen:  []  Soft  []  Non-tender  []  No organomegaly  []  Normal BS    MSK  []  Normal gait  []  Normal ROM   []  Normal muscle tone  []  No joint tenderness    Neuro  []  Normal       monofilament  []  Normal vibratory sense  []  Normal strength  []  DTR 2+    Derm  []  No foot ulcers  [x]  No rashes   []  No lipohypertrophy      Psych  [x]  Appropriate affect  [x]  Alert and oriented x3          Lab Review:  Lab Results   Component Value Date    HGBA1C 8.9 (H) 09/05/2022    HGBA1C 8.1 05/12/2022    HGBA1C 8.2 (H) 08/17/2021      Lab Results   Component Value Date    GLUCOSE 147 (H) 09/05/2022    ALBCREATUR  09/05/2022      Comment:      Unable to calculate. Test result is below detection limit.    CHOLDLQ 80 01/27/2018    CHOLDLCAL 57 09/05/2022    CPEPTIDE <0.2 (L) 09/03/2017      Lab Results   Component Value Date    CREAT 1.09 09/05/2022    BUN 19 09/05/2022    NA 135 09/05/2022    K 4.5 09/05/2022    CL 95 (L) 09/05/2022    CO2 26 09/05/2022      Lab Results   Component Value Date    WBC 6.96 09/05/2022    HGB 10.8 (L) 09/05/2022    HCT 32.0 (L) 09/05/2022    MCV 86.7 09/05/2022    PLT 338 09/05/2022      No results found for: ''OSMOLALITY''   Lab Results   Component Value Date    KETONESUR Negative  09/05/2022      No results found for: ''PHVEN'', ''PHART''   Lab Results   Component Value Date    CHOL 186 09/05/2022    CHOLHDL 119 09/05/2022    CHOLDLQ 80 01/27/2018    CHOLDLCAL 57 09/05/2022    TRIGLY 49 09/05/2022        Assessment/Plan:    77 y.o. male presenting to the [facility] with the following issues:    1. Type 1 Diabetes Mellitus  Lab Results   Component Value Date    HGBA1C 8.9 (H) 09/05/2022    HGBA1C 8.1 05/12/2022    HGBA1C 8.2 (H) 08/17/2021     Uncontrolled T1DM.  No known DM complications. Complicated by severe hypoglycemia.    A1C goal < 7.5%: to reduce risk of diabetes related complications to the eyes, feet and kidneys with additional goal of hypoglycemia risk reduction.  On CGM, Normal fasting sugars. Post-prandial hyperglycemia after meals due to insufficient insulin with meals or higher carb intake. Postprandial hypoglycemia due to excess mealtime insulin or exercise before meals. Based on CGM interpetration, Continue same regimen. Continue to learn how to count carbohydrates. Discussed proper timing of insulin in relation to food to minimize pp hyperglycemia. Discussed hypoglycemia treatment and rule of 15. Recommend record keeping including carbohydrates, meals, and insulin doses.   -Please see DM regimen below in patient instructions   -High frequency glucose monitoring with CGM. Ordered freestyle libre 3 reader and cgm as backup in case his dexcom fails.  More cost effective, out of pocket. Also emailed dexcom rep to see if there is any way that extra sensors can be mailed out when his sensors fail.    -Hypoglycemia protocol reviewed. ''Rule of 15'' handout given also. Carry glucose tabs at all times.   -Check glucose prior to driving. Glucose must be > 100 mg/dl. Reviewed risks related to driving with hypoglycemia   -Glucagon reviewed.      2. Hypertension   -Continue current regimen, including ACE-I inhibitor or ARB therapy    3. Hyperlipidemia   Per AHA guidelines, statin therapy is recommended with history of diabetes mellitus   -Continue statin therapy.    4. Diabetic microvascular disease screening:  Health Maintenance   Topic Date Due    Diabetes: Eye Exam  11/19/2021    Diabetes: Hemoglobin A1c Blood Test  03/08/2023    Diabetes Kidney Monitoring: Urine Test  09/05/2023     5. ETOH use.  Hypoglycemia precautions discussed with patient.     6. Sleep Disorder.  Dtr suspects OSA.  See PMD re: eval.    #CKD, chronic.    Lab Results   Component Value Date    GFRESTCKDEPI 69 09/05/2022    GFRESTCKDEPI >89 08/17/2021    GFRESTCKDEPI 74 08/10/2021         RTC in 4 monhts    Orders Placed This Encounter    POCT glucose    Diabetes Eye Exam     32 minutes were spent personally by me today on this encounter which include today's pre-visit review of the chart, obtaining appropriate history, performing an evaluation, documentation and discussion of management with details supported within the note for today's visit. The time documented was exclusive of any time spent on the separately billed procedure.        Author: Jeri Cos. Noreene Filbert, MD 11/12/2022 10:48 AM     There are no Patient Instructions on file for this visit.

## 2022-11-23 DIAGNOSIS — N189 Chronic kidney disease, unspecified: Secondary | ICD-10-CM

## 2022-11-23 DIAGNOSIS — E785 Hyperlipidemia, unspecified: Secondary | ICD-10-CM

## 2022-12-16 MED ORDER — ONETOUCH ULTRA VI STRP
ORAL_STRIP | ORAL | 0 refills | 30.00000 days
Start: 2022-12-16 — End: ?

## 2022-12-21 MED ORDER — ONETOUCH ULTRA VI STRP
ORAL_STRIP | 0 refills | Status: AC
Start: 2022-12-21 — End: ?

## 2023-01-02 ENCOUNTER — Ambulatory Visit: Payer: BLUE CROSS/BLUE SHIELD

## 2023-01-16 MED ORDER — AMLODIPINE BESY-BENAZEPRIL HCL 5-20 MG PO CAPS
1 | ORAL_CAPSULE | Freq: Two times a day (BID) | ORAL | 3 refills | Status: AC
Start: 2023-01-16 — End: ?

## 2023-01-16 MED ORDER — SIMVASTATIN 20 MG PO TABS
20 mg | ORAL_TABLET | Freq: Every evening | ORAL | 3 refills | Status: AC
Start: 2023-01-16 — End: ?

## 2023-01-17 MED ORDER — BASAGLAR KWIKPEN 100 UNIT/ML SC SOPN
SUBCUTANEOUS | 9 refills | 32.00000 days
Start: 2023-01-17 — End: ?

## 2023-01-18 ENCOUNTER — Ambulatory Visit: Payer: BLUE CROSS/BLUE SHIELD

## 2023-01-18 MED ORDER — BASAGLAR KWIKPEN 100 UNIT/ML SC SOPN
9 refills | Status: AC
Start: 2023-01-18 — End: ?

## 2023-01-18 NOTE — Telephone Encounter
Orders already pended for provider

## 2023-01-29 MED ORDER — NOVOLOG FLEXPEN 100 UNIT/ML SC SOPN
2-8 [IU] | Freq: Three times a day (TID) | SUBCUTANEOUS | 4 refills
Start: 2023-01-29 — End: ?

## 2023-02-02 MED ORDER — NOVOLOG FLEXPEN 100 UNIT/ML SC SOPN
2-8 [IU] | Freq: Three times a day (TID) | SUBCUTANEOUS | 4 refills | Status: AC
Start: 2023-02-02 — End: ?

## 2023-02-14 ENCOUNTER — Telehealth: Payer: BLUE CROSS/BLUE SHIELD

## 2023-02-14 NOTE — Telephone Encounter
Pt's daughter called in regarding having a missed cal from [name] a few minutes ago. Pt's daughter assumed the call was about the pt's upcoming appt on Monday and I confirmed the time and date. I also relayed that the appt booked on 02/26/23 got cancelled as it appeared as a duplicate appt. Pt's daughter agreed and confirmed it was okay to cancel that other appt.     No further action needed

## 2023-02-15 ENCOUNTER — Other Ambulatory Visit: Payer: BLUE CROSS/BLUE SHIELD

## 2023-02-15 ENCOUNTER — Ambulatory Visit: Payer: BLUE CROSS/BLUE SHIELD

## 2023-02-15 DIAGNOSIS — R051 Acute cough: Secondary | ICD-10-CM

## 2023-02-15 DIAGNOSIS — E1042 Type 1 diabetes mellitus with diabetic polyneuropathy: Secondary | ICD-10-CM

## 2023-02-15 DIAGNOSIS — R55 Syncope and collapse: Secondary | ICD-10-CM

## 2023-02-15 DIAGNOSIS — Z9181 History of falling: Secondary | ICD-10-CM

## 2023-02-15 MED ORDER — ALBUTEROL SULFATE HFA 108 (90 BASE) MCG/ACT IN AERS
2 | RESPIRATORY_TRACT | 2 refills | Status: AC | PRN
Start: 2023-02-15 — End: ?

## 2023-02-15 MED ORDER — MECLIZINE HCL 25 MG PO TABS
25 mg | ORAL_TABLET | Freq: Three times a day (TID) | ORAL | 1 refills | Status: AC | PRN
Start: 2023-02-15 — End: ?

## 2023-02-15 MED ORDER — FLUTICASONE FUROATE-VILANTEROL 200-25 MCG/ACT IN AEPB
1 | Freq: Every day | RESPIRATORY_TRACT | 1 refills | Status: AC
Start: 2023-02-15 — End: ?

## 2023-02-15 MED ORDER — AZITHROMYCIN 250 MG PO TABS
ORAL_TABLET | 0 refills | Status: AC
Start: 2023-02-15 — End: ?

## 2023-02-15 NOTE — Progress Notes
PATIENT: Chad Watkins  MRN: [id number]  DOB: Aug 01, 1945  DATE OF SERVICE: 02/15/2023    CHIEF COMPLAINT:   Chief Complaint   Patient presents with    Loss of Consciousness     SATURDAY AFTERNOON WITH GROUP OF PEOPLE AT [employer], SUDDENLY PASSED OUT AND WHILE UNCONSCIOUS WAS THROWING UP VIOLENTLY.  NO OTHER SICK CONTACTS.  NO SEIZURE LIKE ACTIVITY, NO HEAD TRAUMA, ETC...  NO F/C/URI SX'S/UTI SX'S.  SINCE GOT SICK, HAS HAD BAD COUGH .  CHECKED SUGAR WHILE PASSED OUT - 258.  NOT HYPOGLYCEMIC EPISODE.        Past Medical History:   Diagnosis Date    Alcohol use disorder 09/15/2021    Aortic atherosclerosis 03/13/2020    Colon polyps-2020 09/19/2019    Diabetic neuropathy (HCC/RAF) 01/27/2018    Diabetic peripheral vascular disease (HCC/RAF) 09/03/2017    DM type 1 with diabetic mixed hyperlipidemia (HCC/RAF) 09/03/2017    Edema leg 07/14/2018    Glaucoma     Suspect    History of fracture of left ankle 12/01/2017    Hypertension associated with diabetes (HCC/RAF) 09/03/2017    Long term (current) use of insulin (HCC/RAF) 09/19/2019    Peripheral vascular disease 09/03/2017    Tortuous aorta 03/13/2020    Trigger thumb 01/27/2018    Type 1 diabetes mellitus with diabetic neuropathy, unspecified (HCC/RAF) 09/03/2017       Past Surgical History:   Procedure Laterality Date    APPENDECTOMY      YAG CAPSULOTOMY Left 10/14/2020    YAG CAPSULOTOMY Right 11/11/2020       Social History     Tobacco Use   Smoking Status Never   Smokeless Tobacco Never       Family History   Problem Relation Age of Onset    Glaucoma Neg Hx     Macular degeneration Neg Hx        Social History     Substance and Sexual Activity   Alcohol Use Yes    Alcohol/week: 4.2 oz    Types: 7 Standard drinks or equivalent per week       Social History     Substance and Sexual Activity   Drug Use Not on file       No Known Allergies    Patient Active Problem List   Diagnosis    Bursitis    Skin cancer    Palpitation    Need for vaccination    Healthcare maintenance Abnormal EKG    Left ankle pain    Hypertension associated with diabetes (HCC/RAF)    DM type 1 with diabetic mixed hyperlipidemia (HCC/RAF)    Diabetic peripheral vascular disease (HCC/RAF)    Peripheral vascular disease    DM (diabetes mellitus), type 1, uncontrolled w/neurologic complication    History of fracture of left ankle    Diabetic neuropathy (HCC/RAF)    Trigger thumb    Edema leg    Mild nonproliferative diabetic retinopathy (HCC/RAF)    Pseudophakia    Phyisological cupping of optic disc, right    Long term (current) use of insulin (HCC/RAF)    Colon polyps-2020    Aortic atherosclerosis    Tortuous aorta    Controlled type 1 diabetes with mild nonproliferative retinopathy (HCC/RAF)    Posterior capsular opacification    Ptosis of eyelid    Refractive error    Alcohol use disorder         Current Outpatient Medications:  amLODIPine-benazepril 5-20 mg capsule, Take 1 capsule by mouth two (2) times daily., Disp: 180 capsule, Rfl: 3    Continuous Blood Gluc Sensor (FREESTYLE LIBRE 3 SENSOR) MISC, Use sensor q14 days., Disp: 2 each, Rfl: 11    FUROSEMIDE 20 mg tablet, take 1 tablet by mouth every day, Disp: 90 tablet, Rfl: 3    glucagon (BAQSIMI TWO PACK) 3 mg/dose nasal powder, 1 actuation (3 mg total) by Left Nare route every fifteen (15) minutes as needed., Disp: 3 each, Rfl: 1    glucose blood test strip, Use as instructed, Disp: 100 each, Rfl: 12    insulin aspart, NovoLOG, (NOVOLOG FLEXPEN) 100 units/mL injection pen, INJECT 0.02-0.08 MLS (2-8 UNITS TOTAL) UNDER THE SKIN THREE (3) TIMES DAILY BEFORE MEALS., Disp: 30 mL, Rfl: 4    insulin glargine (BASAGLAR KWIKPEN) 100 units/mL injection pen, Use 6 units daily. Waste 2 units to prime the needle, Disp: 9 mL, Rfl: 9    insulin glargine (BASAGLAR KWIKPEN) 100 units/mL injection pen, USE 6 UNITS DAILY. WASTE 2 UNITS TO PRIME THE NEEDLE., Disp: 15 mL, Rfl: 9    insulin pen needle (B-D UF NANO PEN NEEDLES) 32G X 4 mm, Check BS 6 times daily.  DX Type 1 DM on insulin [E10.1, Z79.4]., Disp: 600 each, Rfl: 9    metFORMIN (GLUCOPHAGE XR) 500 mg PO ER 24 hr tablet, TAKE 2 TABLETS (1,000 MG TOTAL) BY MOUTH TWICE A DAY WITH FOOD, Disp: 360 tablet, Rfl: 3    ONETOUCH ULTRA test strip, USE 1 STRIP WITH DIABETES TESTING DEVICE TODOS LOS DIAS SEGUN LO INDICADO, Disp: 100 strip, Rfl: 0    PROLENSA 0.07 % SOLN, , Disp: , Rfl:     sildenafil 100 mg tablet, Take 0.5-1 tablets (50-100 mg total) by mouth daily as needed for Erectile Dysfunction., Disp: 10 tablet, Rfl: 5    simvastatin 20 mg tablet, Take 1 tablet (20 mg total) by mouth at bedtime., Disp: 90 tablet, Rfl: 3    albuterol 90 mcg/act inhaler, Inhale 2 puffs every four (4) hours as needed., Disp: 18 g, Rfl: 2    azithromycin (ZITHROMAX Z-PAK) 250 mg tablet, Take 2 tablets (500 mg) on  Day 1,  followed by 1 tablet (250 mg) once daily on Days 2 through 5., Disp: 6 tablet, Rfl: 0    fluticasone-vilanterol 200-25 mcg/act inhaler, Inhale 1 puff daily., Disp: 60 each, Rfl: 1    meclizine 25 mg tablet, Take 1 tablet (25 mg total) by mouth three (3) times daily as needed for Dizziness., Disp: 30 tablet, Rfl: 1    Health Maintenance   Topic Date Due    Hepatitis B Screening  Never done    RSV Vaccine (1 - 1-dose 60+ series) Never done    Advance Directive  Never done    Diabetes: Eye Exam  11/19/2021    Diabetes: HGB A1C  03/08/2023    Diabetes: URINE ALBUMIN-CREATININE RATIO MONITORING  09/05/2023    Diabetes: eGFR MONITORING  09/05/2023    Preventive Wellness Visit  09/11/2023    Tdap/Td Vaccine (3 - Td or Tdap) 11/13/2029    Influenza Vaccine  Completed    Pneumococcal Vaccine  Completed    Hepatitis C Screening  Completed    Shingles (Shingrix) Vaccine  Completed    COVID-19 Vaccine(Tracks primary and booster doses, not sup/immunocomp)  Completed    Statin prescribed for ASCVD Prevention or Treatment  Completed    Colorectal Cancer Screening  Discontinued  Patient Care Team:  Trena Platt., DO as PCP - General (Family Medicine)      Subjective:      Chad Watkins is a 78 y.o. male.    Review of Systems   Constitutional:  Positive for appetite change and fatigue. Negative for activity change, chills, diaphoresis, fever and unexpected weight change.   HENT:  Positive for congestion, postnasal drip, rhinorrhea and sore throat. Negative for dental problem, drooling, ear discharge, ear pain, facial swelling, hearing loss, mouth sores, nosebleeds, sinus pressure, sinus pain, sneezing, tinnitus, trouble swallowing and voice change.    Eyes: Negative.  Negative for photophobia, pain, discharge, redness, itching and visual disturbance.   Respiratory:  Positive for cough. Negative for apnea, choking, chest tightness, shortness of breath, wheezing and stridor.    Cardiovascular: Negative.  Negative for chest pain, palpitations and leg swelling.   Gastrointestinal: Negative.  Negative for abdominal distention, abdominal pain, anal bleeding, blood in stool, constipation, diarrhea, nausea, rectal pain and vomiting.   Endocrine: Negative.  Negative for cold intolerance, heat intolerance, polydipsia, polyphagia and polyuria.   Genitourinary: Negative.  Negative for decreased urine volume, difficulty urinating, dysuria, enuresis, flank pain, frequency, genital sores, hematuria, penile discharge, penile pain, penile swelling, scrotal swelling, testicular pain and urgency.   Musculoskeletal: Negative.  Negative for arthralgias, back pain, gait problem, joint swelling, myalgias, neck pain and neck stiffness.   Skin: Negative.  Negative for color change, pallor, rash and wound.   Allergic/Immunologic: Negative.  Negative for environmental allergies, food allergies and immunocompromised state.   Neurological: Negative.  Negative for dizziness, tremors, seizures, syncope, facial asymmetry, speech difficulty, weakness, light-headedness, numbness and headaches.   Hematological: Negative.  Negative for adenopathy. Does not bruise/bleed easily. Psychiatric/Behavioral: Negative.  Negative for agitation, behavioral problems, confusion, decreased concentration, dysphoric mood, hallucinations, self-injury, sleep disturbance and suicidal ideas. The patient is not nervous/anxious and is not hyperactive.    All other systems reviewed and are negative.        Objective:      Physical Exam  Vitals reviewed.   Constitutional:       General: He is not in acute distress.     Appearance: Normal appearance. He is well-developed and normal weight. He is not ill-appearing, toxic-appearing or diaphoretic.      Comments: BP 109/66  ~ Pulse 70  ~ Temp 37.2 ?C (99 ?F) (Tympanic)  ~ Wt 140 lb (63.5 kg)  ~ SpO2 99%  ~ BMI 21.93 kg/m?      HENT:      Head: Normocephalic and atraumatic.      Right Ear: External ear normal.      Left Ear: External ear normal.      Nose: Nose normal.   Eyes:      Conjunctiva/sclera: Conjunctivae normal.   Pulmonary:      Effort: Pulmonary effort is normal.   Musculoskeletal:      Cervical back: Normal range of motion and neck supple. No rigidity. No muscular tenderness.   Lymphadenopathy:      Cervical: No cervical adenopathy.   Skin:     General: Skin is warm and dry.   Neurological:      General: No focal deficit present.      Mental Status: He is alert and oriented to person, place, and time.   Psychiatric:         Mood and Affect: Mood normal.         Behavior: Behavior normal.  Thought Content: Thought content normal.         Judgment: Judgment normal.             ASSESSMENT AND PLAN     1. Hypertension associated with diabetes (HCC/RAF)    2. DM type 1 with diabetic mixed hyperlipidemia (HCC/RAF)    3. Peripheral vascular disease    4. Diabetic peripheral vascular disease (HCC/RAF)    5. Long term (current) use of insulin (HCC/RAF)    6. Alcohol use disorder    7. Diabetic polyneuropathy associated with type 1 diabetes mellitus (HCC/RAF)    8. At risk for falls    9. Acute cough    10. Syncope, unspecified syncope type      UNLIKELY TO BE D/T ANY COMPLICATION WITH DM.  LIKELY A ''FLU BUG'' - SMALL AMOUNTS OF BRAT DIET SLOWLY.  SLOW HYDRATION, MONITOR    FOR COUGH, AND POSSIBLE ASPIRATION, WILL TREAT WITH ANTIBIOTICS AND INHALERS.  IF PERSIST, GET CXR AND CONSIDER ANOTHER ANTIBIOTIC.      Orders Placed This Encounter    Albumin/Creat Ratio Ur    Amylase    Lipase    CBC & Auto Differential    Hgb A1c    Folate,Serum    Lipid Panel    Vitamin D,25-Hydroxy    Vitamin B12    Urinalysis w/Reflex to Culture    Magnesium    Uric Acid    Comprehensive Metabolic Panel    PSA,Free & Total Profile    Testosterone, Bioavailable and Total, Includes Sex Hormone-Binding Globulin (Adult Males or Individuals on Testosterone Hormone Therapy) (Sendout)    PTH, Intact    TSH    Free T4    C-Reactive Protein    Sedimentation Rate, Erythrocyte    Referral to Rehabilitation, Physical Therapy    meclizine 25 mg tablet    azithromycin (ZITHROMAX Z-PAK) 250 mg tablet    fluticasone-vilanterol 200-25 mcg/act inhaler    albuterol 90 mcg/act inhaler       Wt Readings from Last 5 Encounters:   02/15/23 140 lb (63.5 kg)   11/12/22 145 lb 6.4 oz (66 kg)   09/11/22 141 lb 12.8 oz (64.3 kg)   05/12/22 144 lb 6.4 oz (65.5 kg)   01/22/22 144 lb 12.8 oz (65.7 kg)     BP Readings from Last 5 Encounters:   02/15/23 109/66   11/12/22 115/49   09/11/22 110/53   05/12/22 100/42   01/22/22 139/69       Hgb A1c   Date Value Ref Range Status   09/05/2022 8.9 (H) <5.7 % Final     Comment:     For patients with diabetes, an A1c less than (<) or equal (=) to 7.0% is recommended for most patients, however the goal may be higher or lower depending on age and/or other medical problems.     For a diagnosis of diabetes, A1c greater than (>) or equal(=) to 6.5% indicates diabetes; values between 5.7% and 6.4% may indicate an increased risk of developing diabetes.    Hemoglobin A1c is determined with Roche Cobas immunoassay. The presence of abnormal hemoglobin variants, Thalassemia, and increased concentration of HbF (>7%), may result in lower HbA1c values.   08/17/2021 8.2 (H) <5.7 % Final     Comment:     For patients with diabetes, an A1c less than (<) or equal (=) to 7.0% is recommended for most patients, however the goal may be higher or lower depending on age  and/or other medical problems.   For a diagnosis of diabetes, A1c greater than (>) or equal(=) to 6.5% indicates diabetes; values between 5.7% and 6.4% may indicate an increased risk of developing diabetes.   09/09/2020 7.9 (H) <5.7 % Final     Comment:     For patients with diabetes, an A1c less than (<) or equal (=) to 7.0% is recommended for most patients, however the goal may be higher or lower depending on age and/or other medical problems.   For a diagnosis of diabetes, A1c greater than (>) or equal(=) to 6.5% indicates diabetes; values between 5.7% and 6.4% may indicate an increased risk of developing diabetes.     Hemoglobin A1C, Manual   Date Value Ref Range Status   05/12/2022 8.1 (Ref Range:4.2-6.5%) Note: For patients with diabetes, an A1c less than (<) or equal (=) to 7.0% is recommended for most patients, however the goal may be higher or lower depending on age and/or other medical problems. % Final   06/24/2021 8.3 (Ref Range:4.2-6.5%) Note: For patients with diabetes, an A1c less than (<) or equal (=) to 7.0% is recommended for most patients, however the goal may be higher or lower depending on age and/or other medical problems. % Final   01/22/2021 7.0 (Ref Range:4.2-6.5%) Note: For patients with diabetes, an A1c less than (<) or equal (=) to 7.0% is recommended for most patients, however the goal may be higher or lower depending on age and/or other medical problems. % Final     Cholesterol   Date Value Ref Range Status   09/05/2022 186 See Comment mg/dL Final     Comment:     The significance of total cholesterol depends on the values of LDL, HDL, triglycerides and the clinical context. A patient-provider discussion may be considered.       08/17/2021 173 See Comment mg/dL Final     Comment:     The significance of total cholesterol depends on the values of LDL, HDL, triglycerides and the clinical context. A patient-provider discussion may be considered.       09/09/2020 192 See Comment mg/dL Final     Comment:     The significance of total cholesterol depends on the values of LDL, HDL, triglycerides and the clinical context. A patient-provider discussion may be considered.         Cholesterol,LDL,Calc   Date Value Ref Range Status   09/05/2022 57 <100 mg/dL Final     Comment:     Patient is non-fasting, interpret with caution.  If LDL value falls outside of the designated range AND if  included in any of the following categories, a  patient-provider discussion is recommended.     Statin therapy is recommended for individuals:  1. with clinical atherosclerotic cardiovascular disease     irrespective of LDL levels;  2. with LDL > or = 190 mg/dL;  3. with diabetes, aged 40-75 years, with LDL between 70 and     189 mg/dL;  4. without any of the above but who have LDL between 70 and     189 mg/dL and an estimated 45-WUJW risk of     atherosclerotic cardiovascular disease > or = 7.5%     (consider statin therapy if estimated 10-year risk > or =     5.0%) (ACC/AHA 2013 Guidelines).   08/17/2021 55 <100 mg/dL Final     Comment:     Patient is non-fasting, interpret with caution.  If LDL value falls  outside of the designated range AND if  included in any of the following categories, a  patient-provider discussion is recommended.     Statin therapy is recommended for individuals:  1. with clinical atherosclerotic cardiovascular disease     irrespective of LDL levels;  2. with LDL > or = 190 mg/dL;  3. with diabetes, aged 40-75 years, with LDL between 70 and     189 mg/dL;  4. without any of the above but who have LDL between 70 and     189 mg/dL and an estimated 16-XWRU risk of     atherosclerotic cardiovascular disease > or = 7.5% (consider statin therapy if estimated 10-year risk > or =     5.0%) (ACC/AHA 2013 Guidelines).   09/09/2020 50 <100 mg/dL Final     Comment:     Patient is non-fasting, interpret with caution.  If LDL value falls outside of the designated range AND if  included in any of the following categories, a  patient-provider discussion is recommended.     Statin therapy is recommended for individuals:  1. with clinical atherosclerotic cardiovascular disease     irrespective of LDL levels;  2. with LDL > or = 190 mg/dL;  3. with diabetes, aged 40-75 years, with LDL between 70 and     189 mg/dL;  4. without any of the above but who have LDL between 70 and     189 mg/dL and an estimated 04-VWUJ risk of     atherosclerotic cardiovascular disease > or = 7.5%     (consider statin therapy if estimated 10-year risk > or =     5.0%) (ACC/AHA 2013 Guidelines).     Cholesterol, HDL   Date Value Ref Range Status   09/05/2022 119 >40 mg/dL Final     Comment:     If HDL cholesterol level falls outside of the designated  range, a patient-provider discussion is recommended   08/17/2021 109 >40 mg/dL Final     Comment:     If HDL cholesterol level falls outside of the designated  range, a patient-provider discussion is recommended   09/09/2020 129 >40 mg/dL Final     Comment:     If HDL cholesterol level falls outside of the designated  range, a patient-provider discussion is recommended     Triglycerides   Date Value Ref Range Status   09/05/2022 49 <150 mg/dL Final     Comment:     Patient is non-fasting, interpret with caution.  If Triglyceride level falls outside of the designated range,  a patient-provider discussion is recommended.     08/17/2021 43 <150 mg/dL Final     Comment:     Patient is non-fasting, interpret with caution.  If Triglyceride level falls outside of the designated range,  a patient-provider discussion is recommended.     09/09/2020 63 <150 mg/dL Final     Comment:     Patient is non-fasting, interpret with caution.  If Triglyceride level falls outside of the designated range,  a patient-provider discussion is recommended.        Creatinine   Date Value Ref Range Status   09/05/2022 1.09 0.60 - 1.30 mg/dL Final   81/19/1478 2.95 0.60 - 1.30 mg/dL Final   62/13/0865 7.84 0.60 - 1.30 mg/dL Final        PHQ-9 Results      09/11/2015    10:53 AM 12/24/2016    10:44 AM 01/22/2017    11:45 AM  01/27/2018     9:06 AM 09/19/2019     9:58 AM 09/11/2022    12:00 PM 09/11/2022    12:30 PM   Depression Screening (Patient Health Questionnaire PHQ)   PHQ-2: Feeling down, depressed, or hopeless No No No No No  Not at all   PHQ-2: Little interest or pleassure in doing things No No No No No  Not at all   Little interest or pleasure in doing things      Not at all    Feeling down, depressed, or hopeless      Not at all    Trouble falling or staying asleep, or sleeping too much      Several days    Feeling tired or having little energy      Not at all    Poor appetite or overeating      Not at all    Feeling bad about yourself - or that you are a failure or have let yourself or your family down      Not at all    Trouble concentrating on things, such as reading the newspaper or watching television      Not at all    Moving or speaking so slowly Or being so fidgety or restless      Not at all    Thoughts that you would be better off dead, or of hurting yourself in some way      Not at all    Total Score      1        GAD-7 Results      09/11/2022    12:00 PM   GAD-7   Feeling nervous, anxious, or on edge Several days   Not being able to stop or control worrying Several days   Worrying too much about different things Not at all   Trouble relaxing Several days   Being so restless that is hard to sit still Several days   Becoming easily annoyed or irritable Not at all   Feeling afraid, as if something awful might happen Not at all   Total Score 4       DAST Results       No data to display                Audit-C results       No data to display No follow-ups on file.  The above plan of care, diagnosis, orders, and follow-up were discussed with the patient.  Questions related to this recommended plan of care were answered.  Lonzo Cloud. Karolee Ohs, DO  02/15/2023    Author:  Lonzo Cloud. Deyra Perdomo 02/15/2023 11:05 AM

## 2023-02-16 ENCOUNTER — Other Ambulatory Visit: Payer: BLUE CROSS/BLUE SHIELD

## 2023-02-16 MED ORDER — METFORMIN HCL ER 500 MG PO TB24
1000 mg | ORAL_TABLET | Freq: Two times a day (BID) | ORAL | 1 refills | Status: AC
Start: 2023-02-16 — End: 2023-02-26

## 2023-02-22 ENCOUNTER — Telehealth: Payer: BLUE CROSS/BLUE SHIELD

## 2023-02-22 NOTE — Telephone Encounter
S/w daughter [name], offered her a cancellation for this Thursday at 8am, she accepted it

## 2023-02-22 NOTE — Telephone Encounter
Appointment Accommodation Request      Appointment Type:hospital follow up    Reason for sooner request: patient was admitted at [facility] 1/28 for leg immobility but had a severe flu and blood sugar was extremely high    Date/Time Requested (If any): this week    Last seen by MD: 11/12/22    Any Symptoms:  []  Yes  [x]  No      If yes, what symptoms are you experiencing:   Duration of symptoms (how long):     Patient or caller was offered an appointment but declined.    Patient or caller was advised to seek emergency services if conditions are urgent or emergent.    Patient or caller has been notified of the turnaround time of 1-2 business (days).

## 2023-02-23 ENCOUNTER — Telehealth: Payer: BLUE CROSS/BLUE SHIELD

## 2023-02-23 NOTE — Telephone Encounter
Patient Chad Watkins MRN: [id number]       PCP - Dr. Perfecto Kingdom - February 10th @ 10:15 AM   [address]   [place], [place] [address]   [phone]       Endocrinology - Dr. Milas Kocher - February 6th @ 8 AM   [address] [facility] [address]  [place], [place] [address]  [phone]       Patient confirmed appointments, will mail out reminder letters       Thank you       Jennye Boroughs  Administrative Assistant III  [facility]  P: [phone]  F: [phone]  ----------------------------------------------------------------  E-mail request from Dr. Lorelei Pont        Subject: STJ-A NTD AM9-- 2holdovers         Discharges:    Chad Watkins ~ DOB: 08/17/1945 ~ PCP: Perfecto Kingdom   PCP follow up in one week. [facility] in 1-2 weeks (he is stablished patient with [facility])

## 2023-02-25 ENCOUNTER — Ambulatory Visit: Payer: BLUE CROSS/BLUE SHIELD

## 2023-02-25 DIAGNOSIS — E109 Type 1 diabetes mellitus without complications: Secondary | ICD-10-CM

## 2023-02-25 MED ORDER — BAQSIMI TWO PACK 3 MG/DOSE NA POWD
1 | NASAL | 1 refills | Status: AC | PRN
Start: 2023-02-25 — End: ?

## 2023-02-25 MED ORDER — BD PEN NEEDLE NANO U/F 32G X 4 MM MISC
9 refills | Status: AC
Start: 2023-02-25 — End: ?

## 2023-02-25 MED ORDER — BASAGLAR KWIKPEN 100 UNIT/ML SC SOPN
9 refills | Status: AC
Start: 2023-02-25 — End: ?

## 2023-02-25 NOTE — Progress Notes
PATIENT: Chad Watkins  MRN: [id number]  DOB: 1945/11/21  DATE OF SERVICE: 02/25/2023    REFERRING PRACTITIONER: No ref. provider found   PRIMARY CARE PROVIDER: Trena Platt., DO    Reason for Consultation: Type 1 Diabetes Mellitus    Subjective:     History of Present Illness  Chad Watkins is a 78 y.o. male with type 1 diabetes mellitus. The patient presents for follow-up to the [facility].       Type 1 diabetes was diagnosed in 1989, and treated with insulin then.     He denies hx of DKA, but has had ''comas'' attributed to hypoglycemia that required paramedic visits, twice in July 2019.      Interval events:  Blood sugars were running high about 2 weeks ago when he fell ill with flu. Blood sugars are now improved.     Patient is testing BG 4x daily when not using CGM.   Patient uses insulin 4x daily.     When patient has CGM device, patient uses it consistently.   Patient tests blood glucose to calibrate their CGM.   Patient continues to dose insulin >3x/day and frequently adjusts insulin dosage based on the CGM device information.      Current DM Medications  Continuar Lantus 6 units en la manana    Para desayuno    Blood glucose   Humalog Insulin  101-200    4  Units  201-300    5 units  >301     6 units     Para Lonche y Cena    Blood glucose   Humalog Insulin  101-200    3  Units  201-300    4 units  >301     5 units     Glucose Review:  Data from glucose meter:            In examining the patterns and providing interpretation the following was noted: Elevated fasting sugars. Post-prandial hyperglycemia after meals due to insufficient insulin with meals. Blood sugars are starting to improve and dip lower today. Based on CGM interpetration, Continue to learn how to count carbohydrates. Discussed proper timing of insulin in relation to food to minimize pp hyperglycemia. Reduce basal insulin. Increase mealtime insulin. Discussed hypoglycemia treatment and rule of 15. Recommend record keeping including carbohydrates, meals, and insulin doses.    Exercise:   Walks. On his feet 8 hrs at work all day.      Complications & Associated Co-morbidities Yes No Comments/Treatment to Date   Retinopathy  []    [x]      Nephropathy  []    [x]   Lab Results   Component Value Date    CREAT 1.20 02/15/2023    ALBCREATUR 60.0 (H) 02/15/2023       Neuropathy  []    [x]      Gastroparesis  []   [x]     CAD  []    [x]      Stroke  []    [x]      PVD  []   [x]     Sleep Apnea  [x]    []   Not sure. Poor sleep?       Past Medical History  Past Medical History:   Diagnosis Date    Alcohol use disorder 09/15/2021    Aortic atherosclerosis 03/13/2020    Colon polyps-2020 09/19/2019    Diabetic neuropathy (HCC/RAF) 01/27/2018    Diabetic peripheral vascular disease (HCC/RAF) 09/03/2017    DM type  1 with diabetic mixed hyperlipidemia (HCC/RAF) 09/03/2017    Edema leg 07/14/2018    Glaucoma     Suspect    History of fracture of left ankle 12/01/2017    Hypertension associated with diabetes (HCC/RAF) 09/03/2017    Long term (current) use of insulin (HCC/RAF) 09/19/2019    Peripheral vascular disease 09/03/2017    Tortuous aorta 03/13/2020    Trigger thumb 01/27/2018    Type 1 diabetes mellitus with diabetic neuropathy, unspecified (HCC/RAF) 09/03/2017       Past Surgical History  Past Surgical History:   Procedure Laterality Date    APPENDECTOMY      YAG CAPSULOTOMY Left 10/14/2020    YAG CAPSULOTOMY Right 11/11/2020       Medications  Current Outpatient Medications   Medication Sig    albuterol 90 mcg/act inhaler Inhale 2 puffs every four (4) hours as needed.    amLODIPine-benazepril 5-20 mg capsule Take 1 capsule by mouth two (2) times daily.    azithromycin (ZITHROMAX Z-PAK) 250 mg tablet Take 2 tablets (500 mg) on  Day 1,  followed by 1 tablet (250 mg) once daily on Days 2 through 5.    Continuous Blood Gluc Sensor (FREESTYLE LIBRE 3 SENSOR) MISC Use sensor q14 days.    fluticasone-vilanterol 200-25 mcg/act inhaler Inhale 1 puff daily. FUROSEMIDE 20 mg tablet take 1 tablet by mouth every day    glucagon (BAQSIMI TWO PACK) 3 mg/dose nasal powder 1 actuation (3 mg total) by Left Nare route every fifteen (15) minutes as needed.    glucose blood test strip Use as instructed    insulin aspart, NovoLOG, (NOVOLOG FLEXPEN) 100 units/mL injection pen INJECT 0.02-0.08 MLS (2-8 UNITS TOTAL) UNDER THE SKIN THREE (3) TIMES DAILY BEFORE MEALS.    insulin glargine (BASAGLAR KWIKPEN) 100 units/mL injection pen Use 6 units daily. Waste 2 units to prime the needle    insulin glargine (BASAGLAR KWIKPEN) 100 units/mL injection pen USE 6 UNITS DAILY. WASTE 2 UNITS TO PRIME THE NEEDLE.    insulin pen needle (B-D UF NANO PEN NEEDLES) 32G X 4 mm Check BS 6 times daily.  DX Type 1 DM on insulin [E10.1, Z79.4].    meclizine 25 mg tablet Take 1 tablet (25 mg total) by mouth three (3) times daily as needed for Dizziness.    metFORMIN (GLUCOPHAGE XR) 500 mg PO ER 24 hr tablet Take 2 tablets (1,000 mg total) by mouth two (2) times daily.    ONETOUCH ULTRA test strip USE 1 STRIP WITH DIABETES TESTING DEVICE TODOS LOS DIAS SEGUN LO INDICADO    PROLENSA 0.07 % SOLN     sildenafil 100 mg tablet Take 0.5-1 tablets (50-100 mg total) by mouth daily as needed for Erectile Dysfunction.    simvastatin 20 mg tablet Take 1 tablet (20 mg total) by mouth at bedtime.     No current facility-administered medications for this visit.     Facility-Administered Medications Ordered in Other Visits   Medication Dose Route Frequency    [DISCONTINUED] amLODIPine tab  5 mg Oral     [DISCONTINUED] azithromycin tab  500 mg Oral     [DISCONTINUED] dextrose 50% inj  12.5-25 g Intravenous     [DISCONTINUED] enoxaparin 40 mg/0.4 mL inj  40 mg Subcutaneous     [DISCONTINUED] GENERIC EXTERNAL MEDICATION        [DISCONTINUED] GENERIC EXTERNAL MEDICATION  1 g Intravenous     [DISCONTINUED] GENERIC EXTERNAL MEDICATION        [  DISCONTINUED] GENERIC EXTERNAL MEDICATION        [DISCONTINUED] GENERIC EXTERNAL MEDICATION        [DISCONTINUED] guaiFENesin tab ER12  600 mg Oral     [DISCONTINUED] insulin lispro (HumaLOG) 100 unit/mL inj vial  0-12 Units Subcutaneous     [DISCONTINUED] insulin lispro (HumaLOG) 100 unit/mL inj vial  5 Units Subcutaneous     [DISCONTINUED] melatonin tab  3 mg Oral     [DISCONTINUED] oseltamivir (Tamiflu) cap  75 mg Oral      Allergies  Patient has no known allergies.    Family History  Family History   Problem Relation Age of Onset    Glaucoma Neg Hx     Macular degeneration Neg Hx        Social History  Social History     Socioeconomic History    Marital status: Married    Number of children: 4   Occupational History    Occupation: Soil scientist    Tobacco Use    Smoking status: Never    Smokeless tobacco: Never   Substance and Sexual Activity    Alcohol use: Yes     Alcohol/week: 4.2 oz     Types: 7 Standard drinks or equivalent per week       Objective:      Vitals: BP 115/67  ~ Pulse 94  ~ Ht 5' 7'' (1.702 m)  ~ Wt 139 lb (63 kg)  ~ BMI 21.77 kg/m?    Wt Readings from Last 3 Encounters:   02/25/23 139 lb (63 kg)   02/15/23 140 lb (63.5 kg)   11/12/22 145 lb 6.4 oz (66 kg)            Additional Findings   General  [x]  NAD  [x]  Well nourished      Eyes  [x]  Moist conjunctivae   [x]  Anicteric sclera []  PERRL  [x]  No lid lag    HENT  [x]  Normocephalic  [x]  Atraumatic   []  oropharynx clear with moist mucous membranes  []  no caries    Neck  []  Neck supple    []  No JVD  []  No cervical fat pad     Thyroid  []  Normal size  []  No masses/nodules  []  Non-tender  []  No bruit    Lymph  []  No cervical LAD   []  No axillary LAD      Respiratory  []  CTABL  []  No wheezes  []  No crackles []  Normal effort    CV  []  RRR  []  Normal S1/S2  []  No m/r/g       []  Pedal pulses 2+  []  No carotid bruit  []  No LE edema     Abdomen:  []  Soft  []  Non-tender  []  No organomegaly  []  Normal BS    MSK  []  Normal gait  []  Normal ROM   []  Normal muscle tone  []  No joint tenderness    Neuro  []  Normal       monofilament  []  Normal vibratory sense  []  Normal strength  []  DTR 2+    Derm  []  No foot ulcers  [x]  No rashes   []  No lipohypertrophy      Psych  [x]  Appropriate affect  [x]  Alert and oriented x3          Lab Review:  Lab Results   Component Value Date    HGBA1C 10.0 (H) 02/15/2023    HGBA1C 8.9 (H) 09/05/2022  HGBA1C 8.1 05/12/2022      Lab Results   Component Value Date    GLUCOSE 235 (H) 02/15/2023    ALBCREATUR 60.0 (H) 02/15/2023    CHOLDLQ 80 01/27/2018    CHOLDLCAL 49 02/15/2023    CPEPTIDE <0.2 (L) 09/03/2017      Lab Results   Component Value Date    CREAT 1.20 02/15/2023    BUN 22 02/15/2023    NA 131 (L) 02/15/2023    K 4.4 02/15/2023    CL 90 (L) 02/15/2023    CO2 26 02/15/2023      Lab Results   Component Value Date    WBC 8.11 02/15/2023    HGB 11.5 (L) 02/15/2023    HCT 34.2 (L) 02/15/2023    MCV 88.1 02/15/2023    PLT 285 02/15/2023      No results found for: ''OSMOLALITY''   Lab Results   Component Value Date    KETONESUR Negative 02/15/2023      No results found for: ''PHVEN'', ''PHART''   Lab Results   Component Value Date    CHOL 166 02/15/2023    CHOLHDL 98 02/15/2023    CHOLDLQ 80 01/27/2018    CHOLDLCAL 49 02/15/2023    TRIGLY 93 02/15/2023        Assessment/Plan:    78 y.o. male presenting to the [facility] with the following issues:    1. Type 1 Diabetes Mellitus  Lab Results   Component Value Date    HGBA1C 10.0 (H) 02/15/2023    HGBA1C 8.9 (H) 09/05/2022    HGBA1C 8.1 05/12/2022     Uncontrolled T1DM.  No known DM complications. Complicated by severe hypoglycemia.    A1C goal < 7.5%: to reduce risk of diabetes related complications to the eyes, feet and kidneys with additional goal of hypoglycemia risk reduction.  On CGM, Elevated fasting sugars. Post-prandial hyperglycemia after meals due to insufficient insulin with meals. Blood sugars are starting to improve and dip lower today. Based on CGM interpetration, Continue to learn how to count carbohydrates. Discussed proper timing of insulin in relation to food to minimize pp hyperglycemia. Reduce basal insulin. Increase mealtime insulin. Discussed hypoglycemia treatment and rule of 15. Recommend record keeping including carbohydrates, meals, and insulin doses.  Recommended returning back to previous DM regimen when infection/flu resolves.    -Please see DM regimen below in patient instructions   -High frequency glucose monitoring with CGM. Ordered freestyle libre 3 reader and cgm as backup in case his dexcom fails.  More cost effective, out of pocket. Also emailed dexcom rep to see if there is any way that extra sensors can be mailed out when his sensors fail.    -Hypoglycemia protocol reviewed. ''Rule of 15'' handout given also. Carry glucose tabs at all times.   -Check glucose prior to driving. Glucose must be > 100 mg/dl. Reviewed risks related to driving with hypoglycemia   -Glucagon reviewed.      2. Hypertension   -Continue current regimen, including ACE-I inhibitor or ARB therapy    3. Hyperlipidemia   Per AHA guidelines, statin therapy is recommended with history of diabetes mellitus   -Continue statin therapy.    4. Diabetic microvascular disease screening:  Health Maintenance   Topic Date Due    Diabetes: Eye Exam  11/19/2021    Diabetes: Hemoglobin A1c Blood Test  08/17/2023    Diabetes Kidney Monitoring: Urine Test  02/15/2024     5.  ETOH use.  Hypoglycemia precautions discussed with patient.     6. Sleep Disorder.  Dtr suspects OSA.  See PMD re: eval.    #CKD, chronic.    Lab Results   Component Value Date    GFRESTCKDEPI 62 02/15/2023    GFRESTCKDEPI 69 09/05/2022    GFRESTCKDEPI >89 08/17/2021         RTC in 4 months    Orders Placed This Encounter    POCT Glucose (Ambulatory Protocol - POCT Glucose)     were spent personally by me today on this encounter which include today's pre-visit review of the chart, obtaining appropriate history, performing an evaluation, documentation and discussion of management with details supported within the note for today's visit. The time documented was exclusive of any time spent on the separately billed procedure.        Author: Jeri Cos. Noreene Filbert, MD 02/25/2023 8:21 AM     There are no Patient Instructions on file for this visit.

## 2023-02-25 NOTE — Patient Instructions
Bajar Lantus 8 units en la manana    Para desayuno    Blood glucose   Humalog Insulin  101-200    5 Units  201-300    6 units  >301     7 units     Para Lonche y Cena    Blood glucose   Humalog Insulin  101-200    3  Units  201-300    4 units  >301     5 units      Cuando se to normalize las azucars porque to infection se mejora, puedes regresar a:    Lantus 6 units en la manana    Para desayuno    Blood glucose   Humalog Insulin  101-200    4  Units  201-300    5 units  >301     6 units     Para Lonche y Cena    Blood glucose   Humalog Insulin  101-200    3  Units  201-300    4 units  >301     5 units

## 2023-02-26 ENCOUNTER — Ambulatory Visit: Payer: BLUE CROSS/BLUE SHIELD

## 2023-03-01 ENCOUNTER — Ambulatory Visit: Payer: BLUE CROSS/BLUE SHIELD

## 2023-03-01 NOTE — Progress Notes
PATIENT: Chad Watkins  MRN: [id number]  DOB: March 02, 1945  DATE OF SERVICE: 03/01/2023    CHIEF COMPLAINT:   Chief Complaint   Patient presents with    Post Hospital Discharge Follow Up     Due to Flu symptoms and DM levels        Past Medical History:   Diagnosis Date    Alcohol use disorder 09/15/2021    Aortic atherosclerosis 03/13/2020    Colon polyps-2020 09/19/2019    Diabetic neuropathy (HCC/RAF) 01/27/2018    Diabetic peripheral vascular disease (HCC/RAF) 09/03/2017    DM type 1 with diabetic mixed hyperlipidemia (HCC/RAF) 09/03/2017    Edema leg 07/14/2018    Glaucoma     Suspect    History of fracture of left ankle 12/01/2017    Hypertension associated with diabetes (HCC/RAF) 09/03/2017    Long term (current) use of insulin (HCC/RAF) 09/19/2019    Peripheral vascular disease 09/03/2017    Tortuous aorta 03/13/2020    Trigger thumb 01/27/2018    Type 1 diabetes mellitus with diabetic neuropathy, unspecified (HCC/RAF) 09/03/2017       Past Surgical History:   Procedure Laterality Date    APPENDECTOMY      YAG CAPSULOTOMY Left 10/14/2020    YAG CAPSULOTOMY Right 11/11/2020       Social History     Tobacco Use   Smoking Status Never   Smokeless Tobacco Never       Family History   Problem Relation Age of Onset    Glaucoma Neg Hx     Macular degeneration Neg Hx        Social History     Substance and Sexual Activity   Alcohol Use Yes    Alcohol/week: 4.2 oz    Types: 7 Standard drinks or equivalent per week       Social History     Substance and Sexual Activity   Drug Use Not on file       No Known Allergies    Patient Active Problem List   Diagnosis    Bursitis    Skin cancer    Palpitation    Need for vaccination    Healthcare maintenance    Abnormal EKG    Left ankle pain    Hypertension associated with diabetes (HCC/RAF)    DM type 1 with diabetic mixed hyperlipidemia (HCC/RAF)    Diabetic peripheral vascular disease (HCC/RAF)    Peripheral vascular disease    DM (diabetes mellitus), type 1, uncontrolled w/neurologic complication    History of fracture of left ankle    Diabetic neuropathy (HCC/RAF)    Trigger thumb    Edema leg    Mild nonproliferative diabetic retinopathy (HCC/RAF)    Pseudophakia    Phyisological cupping of optic disc, right    Long term (current) use of insulin (HCC/RAF)    Colon polyps-2020    Aortic atherosclerosis    Tortuous aorta    Controlled type 1 diabetes with mild nonproliferative retinopathy (HCC/RAF)    Posterior capsular opacification    Ptosis of eyelid    Refractive error    Alcohol use disorder         Current Outpatient Medications:     albuterol 90 mcg/act inhaler, Inhale 2 puffs every four (4) hours as needed., Disp: 18 g, Rfl: 2    amLODIPine-benazepril 5-20 mg capsule, Take 1 capsule by mouth two (2) times daily., Disp: 180 capsule, Rfl: 3    azithromycin (ZITHROMAX Z-PAK) 250  mg tablet, Take 2 tablets (500 mg) on  Day 1,  followed by 1 tablet (250 mg) once daily on Days 2 through 5., Disp: 6 tablet, Rfl: 0    Continuous Blood Gluc Sensor (FREESTYLE LIBRE 3 SENSOR) MISC, Use sensor q14 days., Disp: 2 each, Rfl: 11    fluticasone-vilanterol 200-25 mcg/act inhaler, Inhale 1 puff daily., Disp: 60 each, Rfl: 1    FUROSEMIDE 20 mg tablet, take 1 tablet by mouth every day, Disp: 90 tablet, Rfl: 3    glucagon (BAQSIMI TWO PACK) 3 mg/dose nasal powder, 1 actuation (3 mg total) by Left Nare route every fifteen (15) minutes as needed., Disp: 3 each, Rfl: 1    glucose blood test strip, Use as instructed, Disp: 100 each, Rfl: 12    insulin aspart, NovoLOG, (NOVOLOG FLEXPEN) 100 units/mL injection pen, INJECT 0.02-0.08 MLS (2-8 UNITS TOTAL) UNDER THE SKIN THREE (3) TIMES DAILY BEFORE MEALS., Disp: 30 mL, Rfl: 4    insulin glargine (BASAGLAR KWIKPEN) 100 units/mL injection pen, USE 6 UNITS DAILY. WASTE 2 UNITS TO PRIME THE NEEDLE., Disp: 15 mL, Rfl: 9    insulin glargine (BASAGLAR KWIKPEN) 100 units/mL injection pen, Use 6 units daily. Waste 2 units to prime the needle, Disp: 9 mL, Rfl: 9    insulin pen needle (B-D UF NANO PEN NEEDLES) 32G X 4 mm, Check BS 6 times daily.  DX Type 1 DM on insulin [E10.1, Z79.4], Disp: 600 each, Rfl: 9    meclizine 25 mg tablet, Take 1 tablet (25 mg total) by mouth three (3) times daily as needed for Dizziness., Disp: 30 tablet, Rfl: 1    ONETOUCH ULTRA test strip, USE 1 STRIP WITH DIABETES TESTING DEVICE TODOS LOS DIAS SEGUN LO INDICADO, Disp: 100 strip, Rfl: 0    PROLENSA 0.07 % SOLN, , Disp: , Rfl:     sildenafil 100 mg tablet, Take 0.5-1 tablets (50-100 mg total) by mouth daily as needed for Erectile Dysfunction., Disp: 10 tablet, Rfl: 5    simvastatin 20 mg tablet, Take 1 tablet (20 mg total) by mouth at bedtime., Disp: 90 tablet, Rfl: 3  No current facility-administered medications for this visit.    Health Maintenance   Topic Date Due    Hepatitis B Screening  Never done    Advance Directive  Never done    RSV Vaccine (1 - 1-dose 75+ series) Never done    Diabetes: Eye Exam  11/19/2021    Diabetes: HGB A1C  08/17/2023    Preventive Wellness Visit  09/11/2023    Diabetes: URINE ALBUMIN-CREATININE RATIO MONITORING  02/15/2024    Diabetes: eGFR MONITORING  02/19/2024    Tdap/Td Vaccine (3 - Td or Tdap) 11/13/2029    Influenza Vaccine  Completed    Pneumococcal Vaccine  Completed    Hepatitis C Screening  Completed    Shingles (Shingrix) Vaccine  Completed    COVID-19 Vaccine(Tracks primary and booster doses, not sup/immunocomp)  Completed    Statin prescribed for ASCVD Prevention or Treatment  Completed    Colorectal Cancer Screening  Discontinued       Patient Care Team:  Trena Platt., DO as PCP - General (Family Medicine)      Subjective:      Diego Ulbricht Garverick is a 78 y.o. male.    Review of Systems      Objective:      Physical Exam        ASSESSMENT AND PLAN   No diagnosis  found.    No orders of the defined types were placed in this encounter.      Wt Readings from Last 5 Encounters:   02/25/23 139 lb (63 kg)   02/15/23 140 lb (63.5 kg)   11/12/22 145 lb 6.4 oz (66 kg)   09/11/22 141 lb 12.8 oz (64.3 kg)   05/12/22 144 lb 6.4 oz (65.5 kg)     BP Readings from Last 5 Encounters:   03/01/23 93/60   02/25/23 115/67   02/15/23 109/66   11/12/22 115/49   09/11/22 110/53       Hgb A1c   Date Value Ref Range Status   02/15/2023 10.0 (H) <5.7 % Final     Comment:     For patients with diabetes, an A1c less than (<) or equal (=) to 7.0% is recommended for most patients, however the goal may be higher or lower depending on age and/or other medical problems.     For a diagnosis of diabetes, A1c greater than (>) or equal(=) to 6.5% indicates diabetes; values between 5.7% and 6.4% may indicate an increased risk of developing diabetes.    Hemoglobin A1c is determined with Roche Cobas immunoassay. The presence of abnormal hemoglobin variants, Thalassemia, and increased concentration of HbF (>7%), may result in lower HbA1c values.   09/05/2022 8.9 (H) <5.7 % Final     Comment:     For patients with diabetes, an A1c less than (<) or equal (=) to 7.0% is recommended for most patients, however the goal may be higher or lower depending on age and/or other medical problems.     For a diagnosis of diabetes, A1c greater than (>) or equal(=) to 6.5% indicates diabetes; values between 5.7% and 6.4% may indicate an increased risk of developing diabetes.    Hemoglobin A1c is determined with Roche Cobas immunoassay. The presence of abnormal hemoglobin variants, Thalassemia, and increased concentration of HbF (>7%), may result in lower HbA1c values.   08/17/2021 8.2 (H) <5.7 % Final     Comment:     For patients with diabetes, an A1c less than (<) or equal (=) to 7.0% is recommended for most patients, however the goal may be higher or lower depending on age and/or other medical problems.   For a diagnosis of diabetes, A1c greater than (>) or equal(=) to 6.5% indicates diabetes; values between 5.7% and 6.4% may indicate an increased risk of developing diabetes.     Hemoglobin A1C, Manual   Date Value Ref Range Status   05/12/2022 8.1 (Ref Range:4.2-6.5%) Note: For patients with diabetes, an A1c less than (<) or equal (=) to 7.0% is recommended for most patients, however the goal may be higher or lower depending on age and/or other medical problems. % Final     Cholesterol   Date Value Ref Range Status   02/15/2023 166 See Comment mg/dL Final     Comment:     The significance of total cholesterol depends on the values of LDL, HDL, triglycerides and the clinical context. A patient-provider discussion may be considered.       09/05/2022 186 See Comment mg/dL Final     Comment:     The significance of total cholesterol depends on the values of LDL, HDL, triglycerides and the clinical context. A patient-provider discussion may be considered.       08/17/2021 173 See Comment mg/dL Final     Comment:     The significance of total cholesterol depends on  the values of LDL, HDL, triglycerides and the clinical context. A patient-provider discussion may be considered.         Cholesterol,LDL,Calc   Date Value Ref Range Status   02/15/2023 49 <100 mg/dL Final     Comment:     Patient is non-fasting, interpret with caution.  If LDL value falls outside of the designated range AND if  included in any of the following categories, a  patient-provider discussion is recommended.     Statin therapy is recommended for individuals:  1. with clinical atherosclerotic cardiovascular disease     irrespective of LDL levels;  2. with LDL > or = 190 mg/dL;  3. with diabetes, aged 40-75 years, with LDL between 70 and     189 mg/dL;  4. without any of the above but who have LDL between 70 and     189 mg/dL and an estimated 45-WUJW risk of     atherosclerotic cardiovascular disease > or = 7.5%     (consider statin therapy if estimated 10-year risk > or =     5.0%) (ACC/AHA 2013 Guidelines).   09/05/2022 57 <100 mg/dL Final     Comment:     Patient is non-fasting, interpret with caution.  If LDL value falls outside of the designated range AND if  included in any of the following categories, a  patient-provider discussion is recommended.     Statin therapy is recommended for individuals:  1. with clinical atherosclerotic cardiovascular disease     irrespective of LDL levels;  2. with LDL > or = 190 mg/dL;  3. with diabetes, aged 40-75 years, with LDL between 70 and     189 mg/dL;  4. without any of the above but who have LDL between 70 and     189 mg/dL and an estimated 11-BJYN risk of     atherosclerotic cardiovascular disease > or = 7.5%     (consider statin therapy if estimated 10-year risk > or =     5.0%) (ACC/AHA 2013 Guidelines).   08/17/2021 55 <100 mg/dL Final     Comment:     Patient is non-fasting, interpret with caution.  If LDL value falls outside of the designated range AND if  included in any of the following categories, a  patient-provider discussion is recommended.     Statin therapy is recommended for individuals:  1. with clinical atherosclerotic cardiovascular disease     irrespective of LDL levels;  2. with LDL > or = 190 mg/dL;  3. with diabetes, aged 40-75 years, with LDL between 70 and     189 mg/dL;  4. without any of the above but who have LDL between 70 and     189 mg/dL and an estimated 82-NFAO risk of     atherosclerotic cardiovascular disease > or = 7.5%     (consider statin therapy if estimated 10-year risk > or =     5.0%) (ACC/AHA 2013 Guidelines).     Cholesterol, HDL   Date Value Ref Range Status   02/15/2023 98 >40 mg/dL Final     Comment:     If HDL cholesterol level falls outside of the designated  range, a patient-provider discussion is recommended   09/05/2022 119 >40 mg/dL Final     Comment:     If HDL cholesterol level falls outside of the designated  range, a patient-provider discussion is recommended   08/17/2021 109 >40 mg/dL Final     Comment:  If HDL cholesterol level falls outside of the designated  range, a patient-provider discussion is recommended     Triglycerides   Date Value Ref Range Status 02/15/2023 93 <150 mg/dL Final     Comment:     Patient is non-fasting, interpret with caution.  If Triglyceride level falls outside of the designated range,  a patient-provider discussion is recommended.     09/05/2022 49 <150 mg/dL Final     Comment:     Patient is non-fasting, interpret with caution.  If Triglyceride level falls outside of the designated range,  a patient-provider discussion is recommended.     08/17/2021 43 <150 mg/dL Final     Comment:     Patient is non-fasting, interpret with caution.  If Triglyceride level falls outside of the designated range,  a patient-provider discussion is recommended.        Creatinine   Date Value Ref Range Status   02/15/2023 1.20 0.60 - 1.30 mg/dL Final   19/14/7829 5.62 0.60 - 1.30 mg/dL Final   13/08/6576 4.69 0.60 - 1.30 mg/dL Final        PHQ-9 Results      09/11/2015    10:53 AM 12/24/2016    10:44 AM 01/22/2017    11:45 AM 01/27/2018     9:06 AM 09/19/2019     9:58 AM 09/11/2022    12:00 PM 09/11/2022    12:30 PM   Depression Screening (Patient Health Questionnaire PHQ)   PHQ-2: Feeling down, depressed, or hopeless No No No No No  Not at all   PHQ-2: Little interest or pleassure in doing things No No No No No  Not at all   Little interest or pleasure in doing things      Not at all    Feeling down, depressed, or hopeless      Not at all    Trouble falling or staying asleep, or sleeping too much      Several days    Feeling tired or having little energy      Not at all    Poor appetite or overeating      Not at all    Feeling bad about yourself - or that you are a failure or have let yourself or your family down      Not at all    Trouble concentrating on things, such as reading the newspaper or watching television      Not at all    Moving or speaking so slowly Or being so fidgety or restless      Not at all    Thoughts that you would be better off dead, or of hurting yourself in some way      Not at all    Total Score      1        GAD-7 Results      09/11/2022 12:00 PM   GAD-7   Feeling nervous, anxious, or on edge Several days   Not being able to stop or control worrying Several days   Worrying too much about different things Not at all   Trouble relaxing Several days   Being so restless that is hard to sit still Several days   Becoming easily annoyed or irritable Not at all   Feeling afraid, as if something awful might happen Not at all   Total Score 4       DAST Results       No data  to display                Audit-C results       No data to display                  No follow-ups on file.  The above plan of care, diagnosis, orders, and follow-up were discussed with the patient.  Questions related to this recommended plan of care were answered.  Lonzo Cloud. Karolee Ohs, DO  03/01/2023    Author:  Lonzo Cloud. Skye Rodarte 03/01/2023 10:35 AM

## 2023-04-12 MED ORDER — BREO ELLIPTA 200-25 MCG/ACT IN AEPB
1 refills
Start: 2023-04-12 — End: ?

## 2023-04-16 MED ORDER — BREO ELLIPTA 200-25 MCG/ACT IN AEPB
1 refills | Status: AC
Start: 2023-04-16 — End: ?

## 2023-06-12 MED ORDER — BREO ELLIPTA 200-25 MCG/ACT IN AEPB
TOPICAL | 1 refills | 30.00000 days | Status: AC
Start: 2023-06-12 — End: ?

## 2023-06-24 ENCOUNTER — Ambulatory Visit: Payer: BLUE CROSS/BLUE SHIELD

## 2023-07-19 ENCOUNTER — Other Ambulatory Visit: Payer: BLUE CROSS/BLUE SHIELD

## 2023-07-19 NOTE — Telephone Encounter
 Called pt, appointment was scheduled for tomorrow.     No Further Action Needed.

## 2023-07-20 ENCOUNTER — Inpatient Hospital Stay: Payer: BLUE CROSS/BLUE SHIELD

## 2023-07-20 ENCOUNTER — Ambulatory Visit: Payer: BLUE CROSS/BLUE SHIELD

## 2023-07-20 DIAGNOSIS — R051 Acute cough: Secondary | ICD-10-CM

## 2023-07-20 DIAGNOSIS — E1042 Type 1 diabetes mellitus with diabetic polyneuropathy: Secondary | ICD-10-CM

## 2023-07-20 MED ORDER — GUAIFENESIN-CODEINE 100-10 MG/5ML PO SOLN
5-10 mL | Freq: Every evening | ORAL | 0 refills | 30.00000 days | Status: AC | PRN
Start: 2023-07-20 — End: ?

## 2023-07-20 NOTE — Progress Notes
 PATIENT: Chad Watkins  MRN: [id number]  DOB: December 26, 1945  DATE OF SERVICE: 07/20/2023    CHIEF COMPLAINT:   Chief Complaint   Patient presents with    Cough     X2 days ago.   + subjective fever at start, no other URI sx's    Leg neuropathic vague sx's acting up    Past Medical History:   Diagnosis Date    Alcohol use disorder 09/15/2021    Aortic atherosclerosis 03/13/2020    Colon polyps-2020 09/19/2019    Diabetic neuropathy (HCC/RAF) 01/27/2018    Diabetic peripheral vascular disease (HCC/RAF) 09/03/2017    DM type 1 with diabetic mixed hyperlipidemia (HCC/RAF) 09/03/2017    Edema leg 07/14/2018    Glaucoma     Suspect    History of fracture of left ankle 12/01/2017    Hypertension associated with diabetes (HCC/RAF) 09/03/2017    Long term (current) use of insulin  (HCC/RAF) 09/19/2019    Peripheral vascular disease 09/03/2017    Tortuous aorta 03/13/2020    Trigger thumb 01/27/2018    Type 1 diabetes mellitus with diabetic neuropathy, unspecified (HCC/RAF) 09/03/2017       Past Surgical History:   Procedure Laterality Date    APPENDECTOMY      YAG CAPSULOTOMY Left 10/14/2020    YAG CAPSULOTOMY Right 11/11/2020       Social History     Tobacco Use   Smoking Status Never   Smokeless Tobacco Never       Family History   Problem Relation Age of Onset    Glaucoma Neg Hx     Macular degeneration Neg Hx        Social History     Substance and Sexual Activity   Alcohol Use Yes    Alcohol/week: 4.2 oz    Types: 7 Standard drinks or equivalent per week       Social History     Substance and Sexual Activity   Drug Use Not on file       No Known Allergies    Patient Active Problem List   Diagnosis    Bursitis    Skin cancer    Palpitation    Need for vaccination    Healthcare maintenance    Abnormal EKG    Left ankle pain    Hypertension associated with diabetes (HCC/RAF)    DM type 1 with diabetic mixed hyperlipidemia (HCC/RAF)    Diabetic peripheral vascular disease (HCC/RAF)    Peripheral vascular disease    DM (diabetes mellitus), type 1, uncontrolled w/neurologic complication    History of fracture of left ankle    Diabetic neuropathy (HCC/RAF)    Trigger thumb    Edema leg    Mild nonproliferative diabetic retinopathy (HCC/RAF)    Pseudophakia    Phyisological cupping of optic disc, right    Long term (current) use of insulin  (HCC/RAF)    Colon polyps-2020    Aortic atherosclerosis    Tortuous aorta    Controlled type 1 diabetes with mild nonproliferative retinopathy (HCC/RAF)    Posterior capsular opacification    Ptosis of eyelid    Refractive error    Alcohol use disorder         Current Outpatient Medications:     albuterol  90 mcg/act inhaler, Inhale 2 puffs every four (4) hours as needed., Disp: 18 g, Rfl: 2    amLODIPine -benazepril  5-20 mg capsule, Take 1 capsule by mouth two (2) times daily., Disp: 180 capsule, Rfl:  3    azithromycin  (ZITHROMAX  Z-PAK) 250 mg tablet, Take 2 tablets (500 mg) on  Day 1,  followed by 1 tablet (250 mg) once daily on Days 2 through 5., Disp: 6 tablet, Rfl: 0    Continuous Blood Gluc Sensor (FREESTYLE LIBRE 3 SENSOR) MISC, Use sensor q14 days., Disp: 2 each, Rfl: 11    fluticasone -vilanterol (BREO ELLIPTA ) 200-25 mcg/act inhaler, INHALE UN SOPLIDO POR VIA ORAL A DIARIO, Disp: 60 each, Rfl: 1    FUROSEMIDE  20 mg tablet, take 1 tablet by mouth every day, Disp: 90 tablet, Rfl: 3    glucagon  (BAQSIMI  TWO PACK) 3 mg/dose nasal powder, 1 actuation (3 mg total) by Left Nare route every fifteen (15) minutes as needed., Disp: 3 each, Rfl: 1    glucose blood test strip, Use as instructed, Disp: 100 each, Rfl: 12    insulin  aspart, NovoLOG , (NOVOLOG  FLEXPEN) 100 units/mL injection pen, INJECT 0.02-0.08 MLS (2-8 UNITS TOTAL) UNDER THE SKIN THREE (3) TIMES DAILY BEFORE MEALS., Disp: 30 mL, Rfl: 4    insulin  glargine (BASAGLAR  KWIKPEN) 100 units/mL injection pen, USE 6 UNITS DAILY. WASTE 2 UNITS TO PRIME THE NEEDLE., Disp: 15 mL, Rfl: 9    insulin  glargine (BASAGLAR  KWIKPEN) 100 units/mL injection pen, Use 6 units daily. Waste 2 units to prime the needle, Disp: 9 mL, Rfl: 9    insulin  pen needle (B-D UF NANO PEN NEEDLES) 32G X 4 mm, Check BS 6 times daily.  DX Type 1 DM on insulin  [E10.1, Z79.4], Disp: 600 each, Rfl: 9    meclizine  25 mg tablet, Take 1 tablet (25 mg total) by mouth three (3) times daily as needed for Dizziness., Disp: 30 tablet, Rfl: 1    ONETOUCH ULTRA test strip, USE 1 STRIP WITH DIABETES TESTING DEVICE TODOS LOS DIAS SEGUN LO INDICADO, Disp: 100 strip, Rfl: 0    PROLENSA 0.07 % SOLN, , Disp: , Rfl:     sildenafil  100 mg tablet, Take 0.5-1 tablets (50-100 mg total) by mouth daily as needed for Erectile Dysfunction., Disp: 10 tablet, Rfl: 5    simvastatin  20 mg tablet, Take 1 tablet (20 mg total) by mouth at bedtime., Disp: 90 tablet, Rfl: 3    guaiFENesin-codeine  100-10 mg/5 mL solution, Take 5-10 mLs by mouth at bedtime as needed for Cough. Max Daily Amount: 10 mLs, Disp: 200 mL, Rfl: 0    Health Maintenance   Topic Date Due    Hepatitis B Screening  Never done    Advance Directive  Never done    RSV Vaccine (1 - 1-dose 75+ series) Never done    Diabetes: Eye Exam  11/19/2021    COVID-19 Vaccine(Tracks primary and booster doses, not sup/immunocomp) (7 - 2024-25 season) 03/29/2023    Diabetes: HGB A1C  08/17/2023    Preventive Wellness Visit  09/11/2023    Influenza Vaccine (1) 09/20/2023    Diabetes: URINE ALBUMIN-CREATININE RATIO MONITORING  02/15/2024    Diabetes: eGFR MONITORING  02/19/2024    Tdap/Td Vaccine (3 - Td or Tdap) 11/13/2029    Pneumococcal Vaccine  Completed    Hepatitis C Screening  Completed    Shingles (Shingrix) Vaccine  Completed    Statin prescribed for ASCVD Prevention or Treatment  Completed    Colorectal Cancer Screening  Discontinued       Patient Care Team:  Farrell Glendia HERO., DO as PCP - General (Family Medicine)      Subjective:      Chad Watkins  is a 78 y.o. male.    Review of Systems   Constitutional: Negative.  Negative for activity change, appetite change, chills, diaphoresis, fatigue, fever and unexpected weight change.   HENT: Negative.  Negative for congestion, dental problem, drooling, ear discharge, ear pain, facial swelling, hearing loss, mouth sores, nosebleeds, postnasal drip, rhinorrhea, sinus pressure, sinus pain, sneezing, sore throat, tinnitus, trouble swallowing and voice change.    Eyes: Negative.  Negative for photophobia, pain, discharge, redness, itching and visual disturbance.   Respiratory:  Positive for cough. Negative for apnea, choking, chest tightness, shortness of breath, wheezing and stridor.    Cardiovascular: Negative.  Negative for chest pain, palpitations and leg swelling.   Gastrointestinal: Negative.  Negative for abdominal distention, abdominal pain, anal bleeding, blood in stool, constipation, diarrhea, nausea, rectal pain and vomiting.   Endocrine: Negative.  Negative for cold intolerance, heat intolerance, polydipsia, polyphagia and polyuria.   Genitourinary: Negative.  Negative for decreased urine volume, difficulty urinating, dysuria, enuresis, flank pain, frequency, genital sores, hematuria, penile discharge, penile pain, penile swelling, scrotal swelling, testicular pain and urgency.   Musculoskeletal: Negative.  Negative for arthralgias, back pain, gait problem, joint swelling, myalgias, neck pain and neck stiffness.   Skin: Negative.  Negative for color change, pallor, rash and wound.   Allergic/Immunologic: Negative.  Negative for environmental allergies, food allergies and immunocompromised state.   Neurological: Negative.  Negative for dizziness, tremors, seizures, syncope, facial asymmetry, speech difficulty, weakness, light-headedness, numbness and headaches.   Hematological: Negative.  Negative for adenopathy. Does not bruise/bleed easily.   Psychiatric/Behavioral: Negative.  Negative for agitation, behavioral problems, confusion, decreased concentration, dysphoric mood, hallucinations, self-injury, sleep disturbance and suicidal ideas. The patient is not nervous/anxious and is not hyperactive.    All other systems reviewed and are negative.        Objective:      Physical Exam  Vitals reviewed.   Constitutional:       General: He is not in acute distress.     Appearance: Normal appearance. He is well-developed and normal weight. He is not ill-appearing, toxic-appearing or diaphoretic.      Comments: BP 119/54  ~ Pulse 66  ~ Temp 36.6 ?C (97.8 ?F) (Tympanic)  ~ Ht 5' 7'' (1.702 m)  ~ Wt 147 lb 6.4 oz (66.9 kg)  ~ SpO2 96%  ~ BMI 23.09 kg/m?      HENT:      Head: Normocephalic and atraumatic.      Right Ear: Tympanic membrane, ear canal and external ear normal. There is no impacted cerumen.      Left Ear: Tympanic membrane, ear canal and external ear normal. There is no impacted cerumen.      Nose: Nose normal. No congestion or rhinorrhea.      Mouth/Throat:      Mouth: Mucous membranes are moist.      Pharynx: Oropharynx is clear. No oropharyngeal exudate or posterior oropharyngeal erythema.   Eyes:      General:         Right eye: No discharge.         Left eye: No discharge.      Conjunctiva/sclera: Conjunctivae normal.   Pulmonary:      Effort: Pulmonary effort is normal. No respiratory distress.      Breath sounds: Normal breath sounds. No stridor. No wheezing, rhonchi or rales.   Chest:      Chest wall: No tenderness.   Musculoskeletal:      Cervical  back: Normal range of motion and neck supple. No rigidity. No muscular tenderness.   Lymphadenopathy:      Cervical: No cervical adenopathy.   Skin:     General: Skin is warm and dry.   Neurological:      General: No focal deficit present.      Mental Status: He is alert and oriented to person, place, and time.   Psychiatric:         Mood and Affect: Mood normal.         Behavior: Behavior normal.         Thought Content: Thought content normal.         Judgment: Judgment normal.             ASSESSMENT AND PLAN     1. Acute cough    2. Hypertension associated with diabetes (HCC/RAF)    3. DM type 1 with diabetic mixed hyperlipidemia (HCC/RAF)    4. Long term (current) use of insulin  (HCC/RAF)    5. Diabetic peripheral vascular disease (HCC/RAF)    6. Alcohol use disorder    7. Healthcare maintenance    8. Diabetic polyneuropathy associated with type 1 diabetes mellitus (HCC/RAF)      Recent Results (from the past 48 hours)   POCT COVID-19 Antigen    Collection Time: 07/20/23 10:05 AM   Result Value Ref Range    COVID-19 Antigen, Manual Negative Negative   POCT Influenza A & B, Molecular    Collection Time: 07/20/23 10:05 AM   Result Value Ref Range    POC Influenza A, Molecular Negative Negative    POC Influenza B, Molecular Negative Negative      Swabs negative, CXR appears negative to me.  Cough syrup for bedtime and emphasize inhaler usage.  Monitor    Check labs after feeling better    For PNP, consider gabapentin      Orders Placed This Encounter    XR chest pa+lat (2 views)    Albumin/Creat Ratio Ur    CBC & Auto Differential    Hgb A1c    Folate,Serum    Lipid Panel    Vitamin D,25-Hydroxy    Vitamin B12    Urinalysis w/Reflex to Culture    Magnesium    Uric Acid    Comprehensive Metabolic Panel    PSA,Free & Total Profile    Testosterone, Bioavailable and Total, Includes Sex Hormone-Binding Globulin (Adult Males or Individuals on Testosterone Hormone Therapy) (Sendout)    PTH, Intact    TSH    Free T4    POCT Influenza A & B, Molecular    POCT COVID-19 Antigen    guaiFENesin-codeine  100-10 mg/5 mL solution       Wt Readings from Last 5 Encounters:   07/20/23 147 lb 6.4 oz (66.9 kg)   02/25/23 139 lb (63 kg)   02/15/23 140 lb (63.5 kg)   11/12/22 145 lb 6.4 oz (66 kg)   09/11/22 141 lb 12.8 oz (64.3 kg)     BP Readings from Last 5 Encounters:   07/20/23 119/54   03/01/23 93/60   02/25/23 115/67   02/15/23 109/66   11/12/22 115/49       Hgb A1c   Date Value Ref Range Status   02/15/2023 10.0 (H) <5.7 % Final     Comment:     For patients with diabetes, an A1c less than (<) or equal (=) to 7.0% is recommended for most patients, however  the goal may be higher or lower depending on age and/or other medical problems.     For a diagnosis of diabetes, A1c greater than (>) or equal(=) to 6.5% indicates diabetes; values between 5.7% and 6.4% may indicate an increased risk of developing diabetes.    Hemoglobin A1c is determined with Roche Cobas immunoassay. The presence of abnormal hemoglobin variants, Thalassemia, and increased concentration of HbF (>7%), may result in lower HbA1c values.   09/05/2022 8.9 (H) <5.7 % Final     Comment:     For patients with diabetes, an A1c less than (<) or equal (=) to 7.0% is recommended for most patients, however the goal may be higher or lower depending on age and/or other medical problems.     For a diagnosis of diabetes, A1c greater than (>) or equal(=) to 6.5% indicates diabetes; values between 5.7% and 6.4% may indicate an increased risk of developing diabetes.    Hemoglobin A1c is determined with Roche Cobas immunoassay. The presence of abnormal hemoglobin variants, Thalassemia, and increased concentration of HbF (>7%), may result in lower HbA1c values.   08/17/2021 8.2 (H) <5.7 % Final     Comment:     For patients with diabetes, an A1c less than (<) or equal (=) to 7.0% is recommended for most patients, however the goal may be higher or lower depending on age and/or other medical problems.   For a diagnosis of diabetes, A1c greater than (>) or equal(=) to 6.5% indicates diabetes; values between 5.7% and 6.4% may indicate an increased risk of developing diabetes.     Hemoglobin A1C, Manual   Date Value Ref Range Status   05/12/2022 8.1 (Ref Range:4.2-6.5%) Note: For patients with diabetes, an A1c less than (<) or equal (=) to 7.0% is recommended for most patients, however the goal may be higher or lower depending on age and/or other medical problems. % Final     Cholesterol   Date Value Ref Range Status   02/15/2023 166 See Comment mg/dL Final     Comment:     The significance of total cholesterol depends on the values of LDL, HDL, triglycerides and the clinical context. A patient-provider discussion may be considered.       09/05/2022 186 See Comment mg/dL Final     Comment:     The significance of total cholesterol depends on the values of LDL, HDL, triglycerides and the clinical context. A patient-provider discussion may be considered.       08/17/2021 173 See Comment mg/dL Final     Comment:     The significance of total cholesterol depends on the values of LDL, HDL, triglycerides and the clinical context. A patient-provider discussion may be considered.         Cholesterol,LDL,Calc   Date Value Ref Range Status   02/15/2023 49 <100 mg/dL Final     Comment:     Patient is non-fasting, interpret with caution.  If LDL value falls outside of the designated range AND if  included in any of the following categories, a  patient-provider discussion is recommended.     Statin therapy is recommended for individuals:  1. with clinical atherosclerotic cardiovascular disease     irrespective of LDL levels;  2. with LDL > or = 190 mg/dL;  3. with diabetes, aged 40-75 years, with LDL between 70 and     189 mg/dL;  4. without any of the above but who have LDL between 70 and  189 mg/dL and an estimated 89-bzjm risk of     atherosclerotic cardiovascular disease > or = 7.5%     (consider statin therapy if estimated 10-year risk > or =     5.0%) (ACC/AHA 2013 Guidelines).   09/05/2022 57 <100 mg/dL Final     Comment:     Patient is non-fasting, interpret with caution.  If LDL value falls outside of the designated range AND if  included in any of the following categories, a  patient-provider discussion is recommended.     Statin therapy is recommended for individuals:  1. with clinical atherosclerotic cardiovascular disease     irrespective of LDL levels;  2. with LDL > or = 190 mg/dL;  3. with diabetes, aged 40-75 years, with LDL between 70 and     189 mg/dL;  4. without any of the above but who have LDL between 70 and     189 mg/dL and an estimated 89-bzjm risk of     atherosclerotic cardiovascular disease > or = 7.5%     (consider statin therapy if estimated 10-year risk > or =     5.0%) (ACC/AHA 2013 Guidelines).   08/17/2021 55 <100 mg/dL Final     Comment:     Patient is non-fasting, interpret with caution.  If LDL value falls outside of the designated range AND if  included in any of the following categories, a  patient-provider discussion is recommended.     Statin therapy is recommended for individuals:  1. with clinical atherosclerotic cardiovascular disease     irrespective of LDL levels;  2. with LDL > or = 190 mg/dL;  3. with diabetes, aged 40-75 years, with LDL between 70 and     189 mg/dL;  4. without any of the above but who have LDL between 70 and     189 mg/dL and an estimated 89-bzjm risk of     atherosclerotic cardiovascular disease > or = 7.5%     (consider statin therapy if estimated 10-year risk > or =     5.0%) (ACC/AHA 2013 Guidelines).     Cholesterol, HDL   Date Value Ref Range Status   02/15/2023 98 >40 mg/dL Final     Comment:     If HDL cholesterol level falls outside of the designated  range, a patient-provider discussion is recommended   09/05/2022 119 >40 mg/dL Final     Comment:     If HDL cholesterol level falls outside of the designated  range, a patient-provider discussion is recommended   08/17/2021 109 >40 mg/dL Final     Comment:     If HDL cholesterol level falls outside of the designated  range, a patient-provider discussion is recommended     Triglycerides   Date Value Ref Range Status   02/15/2023 93 <150 mg/dL Final     Comment:     Patient is non-fasting, interpret with caution.  If Triglyceride level falls outside of the designated range,  a patient-provider discussion is recommended.     09/05/2022 49 <150 mg/dL Final     Comment:     Patient is non-fasting, interpret with caution.  If Triglyceride level falls outside of the designated range,  a patient-provider discussion is recommended.     08/17/2021 43 <150 mg/dL Final     Comment:     Patient is non-fasting, interpret with caution.  If Triglyceride level falls outside of the designated range,  a patient-provider discussion is recommended.  Creatinine   Date Value Ref Range Status   02/15/2023 1.20 0.60 - 1.30 mg/dL Final   91/82/7975 8.90 0.60 - 1.30 mg/dL Final   92/69/7976 9.15 0.60 - 1.30 mg/dL Final        PHQ-9 Results      09/11/2015    10:53 AM 12/24/2016    10:44 AM 01/22/2017    11:45 AM 01/27/2018     9:06 AM 09/19/2019     9:58 AM 09/11/2022    12:00 PM 09/11/2022    12:30 PM   Depression Screening (Patient Health Questionnaire PHQ)   PHQ-2: Feeling down, depressed, or hopeless No No No No No  Not at all   PHQ-2: Little interest or pleassure in doing things No No No No No  Not at all   Little interest or pleasure in doing things      Not at all    Feeling down, depressed, or hopeless      Not at all    Trouble falling or staying asleep, or sleeping too much      Several days    Feeling tired or having little energy      Not at all    Poor appetite or overeating      Not at all    Feeling bad about yourself - or that you are a failure or have let yourself or your family down      Not at all    Trouble concentrating on things, such as reading the newspaper or watching television      Not at all    Moving or speaking so slowly Or being so fidgety or restless      Not at all    Thoughts that you would be better off dead, or of hurting yourself in some way      Not at all    Total Score      1        GAD-7 Results      09/11/2022    12:00 PM   GAD-7   Feeling nervous, anxious, or on edge Several days   Not being able to stop or control worrying Several days   Worrying too much about different things Not at all   Trouble relaxing Several days   Being so restless that is hard to sit still Several days   Becoming easily annoyed or irritable Not at all   Feeling afraid, as if something awful might happen Not at all   Total Score 4       DAST Results       No data to display                Audit-C results       No data to display                  No follow-ups on file.  The above plan of care, diagnosis, orders, and follow-up were discussed with the patient.  Questions related to this recommended plan of care were answered.  Glendia HERO. Farrell, DO  07/20/2023    Author:  Glendia HERO. Somaya Grassi 07/20/2023 10:18 AM

## 2023-08-14 MED ORDER — BREO ELLIPTA 200-25 MCG/ACT IN AEPB
1 refills
Start: 2023-08-14 — End: ?

## 2023-08-17 MED ORDER — BREO ELLIPTA 200-25 MCG/ACT IN AEPB
TOPICAL | 0 refills | 90.00000 days | Status: AC
Start: 2023-08-17 — End: ?

## 2023-09-16 ENCOUNTER — Telehealth: Payer: BLUE CROSS/BLUE SHIELD

## 2023-09-16 ENCOUNTER — Other Ambulatory Visit: Payer: BLUE CROSS/BLUE SHIELD

## 2023-09-16 DIAGNOSIS — E109 Type 1 diabetes mellitus without complications: Principal | ICD-10-CM

## 2023-09-16 MED ORDER — FREESTYLE LIBRE 3 SENSOR MISC
ORAL | 3 refills | Status: AC
Start: 2023-09-16 — End: ?

## 2023-09-16 MED ORDER — FREESTYLE LIBRE 3 READER DEVI
ORAL | 0 refills | Status: AC
Start: 2023-09-16 — End: ?

## 2023-09-16 NOTE — Telephone Encounter
 Call Back Request      Reason for call back: Patients daughter is following up on dexa com sensors. Please read MyChart message with provider Bethel as she asked to pick up tomorrow. Daughter also scheduled soonest appt for a PT in December.    Any Symptoms:  []  Yes  [x]  No      If yes, what symptoms are you experiencing:    Duration of symptoms (how long):    Have you taken medication for symptoms (OTC or Rx):      If call was taken outside of clinic hours:    [] Patient or caller has been notified that this message was sent outside of normal clinic hours.     [] Patient or caller has been warm transferred to the physician's answering service. If applicable, patient or caller informed to please call us  back if symptoms progress.  Patient or caller has been notified of the turnaround time of 1-2 business day(s).

## 2023-10-07 MED ORDER — FUROSEMIDE 20 MG PO TABS
ORAL_TABLET | ORAL | 3 refills | 30.00000 days | Status: AC
Start: 2023-10-07 — End: ?

## 2023-11-09 ENCOUNTER — Other Ambulatory Visit: Payer: BLUE CROSS/BLUE SHIELD

## 2023-11-14 MED ORDER — BREO ELLIPTA 200-25 MCG/ACT IN AEPB
RESPIRATORY_TRACT | 3 refills | 30.00000 days | Status: AC
Start: 2023-11-14 — End: ?

## 2023-11-26 ENCOUNTER — Ambulatory Visit: Payer: BLUE CROSS/BLUE SHIELD

## 2023-11-26 DIAGNOSIS — E1042 Type 1 diabetes mellitus with diabetic polyneuropathy: Secondary | ICD-10-CM

## 2023-11-26 DIAGNOSIS — F458 Other somatoform disorders: Secondary | ICD-10-CM

## 2023-11-26 DIAGNOSIS — H9209 Otalgia, unspecified ear: Secondary | ICD-10-CM

## 2023-11-26 DIAGNOSIS — R0982 Postnasal drip: Secondary | ICD-10-CM

## 2023-11-26 NOTE — Progress Notes
 PATIENT: Chad Watkins  MRN: [id number]  DOB: [birth year]  DATE OF SERVICE: 11/26/2023    CHIEF COMPLAINT:   Chief Complaint   Patient presents with    Otalgia     Head pain discomfort on the skull and throat/neck discomfort on the right side     Right temporal region near masseter muscle - does clench and grind (though bottom dentures come out at night.    Bad cough for months - reports PND.      Occasional ear pain    Past Medical History:   Diagnosis Date    Alcohol use disorder 09/15/2021    Aortic atherosclerosis 03/13/2020    Colon polyps-2020 09/19/2019    Diabetic neuropathy (HCC/RAF) 01/27/2018    Diabetic peripheral vascular disease (HCC/RAF) 09/03/2017    DM type 1 with diabetic mixed hyperlipidemia (HCC/RAF) 09/03/2017    Edema leg 07/14/2018    Glaucoma     Suspect    History of fracture of left ankle 12/01/2017    Hypertension associated with diabetes (HCC/RAF) 09/03/2017    Long term (current) use of insulin  (HCC/RAF) 09/19/2019    Peripheral vascular disease 09/03/2017    Tortuous aorta 03/13/2020    Trigger thumb 01/27/2018    Type 1 diabetes mellitus with diabetic neuropathy, unspecified (HCC/RAF) 09/03/2017       Past Surgical History:   Procedure Laterality Date    APPENDECTOMY      YAG CAPSULOTOMY Left 10/14/2020    YAG CAPSULOTOMY Right 11/11/2020       Social History     Tobacco Use   Smoking Status Never   Smokeless Tobacco Never       Family History   Problem Relation Age of Onset    Glaucoma Neg Hx     Macular degeneration Neg Hx        Social History     Substance and Sexual Activity   Alcohol Use Yes    Alcohol/week: 4.2 oz    Types: 7 Standard drinks or equivalent per week       Social History     Substance and Sexual Activity   Drug Use Not on file       No Known Allergies    Patient Active Problem List   Diagnosis    Bursitis    Skin cancer    Palpitation    Need for vaccination    Healthcare maintenance    Abnormal EKG    Left ankle pain    Hypertension associated with diabetes (HCC/RAF) DM type 1 with diabetic mixed hyperlipidemia (HCC/RAF)    Diabetic peripheral vascular disease (HCC/RAF)    Peripheral vascular disease    DM (diabetes mellitus), type 1, uncontrolled w/neurologic complication    History of fracture of left ankle    Diabetic neuropathy (HCC/RAF)    Trigger thumb    Edema leg    Mild nonproliferative diabetic retinopathy (HCC/RAF)    Pseudophakia    Phyisological cupping of optic disc, right    Long term (current) use of insulin  (HCC/RAF)    Colon polyps-2020    Aortic atherosclerosis    Tortuous aorta    Controlled type 1 diabetes with mild nonproliferative retinopathy (HCC/RAF)    Posterior capsular opacification    Ptosis of eyelid    Refractive error    Alcohol use disorder    Bruxism (teeth grinding)    PND (post-nasal drip)         Current Outpatient Medications:  albuterol  90 mcg/act inhaler, Inhale 2 puffs every four (4) hours as needed., Disp: 18 g, Rfl: 2    amLODIPine -benazepril  5-20 mg capsule, Take 1 capsule by mouth two (2) times daily., Disp: 180 capsule, Rfl: 3    azithromycin  (ZITHROMAX  Z-PAK) 250 mg tablet, Take 2 tablets (500 mg) on  Day 1,  followed by 1 tablet (250 mg) once daily on Days 2 through 5., Disp: 6 tablet, Rfl: 0    Continuous Blood Gluc Sensor (FREESTYLE LIBRE 3 SENSOR) MISC, Use sensor q14 days., Disp: 2 each, Rfl: 11    Continuous Glucose Receiver (FREESTYLE LIBRE 3 READER) DEVI, Use one device to monitor blood sugar., Disp: 1 each, Rfl: 0    Continuous Glucose Sensor (FREESTYLE LIBRE 3 SENSOR) MISC, Change Q 14 days, Disp: 6 each, Rfl: 3    fluticasone -vilanterol (BREO ELLIPTA ) 200-25 mcg/act inhaler, INHALE UN SOPLIDO POR VIA ORAL A DIARIO, Disp: 180 each, Rfl: 3    FUROSEMIDE  20 mg tablet, TAKE 1-2 TABLETS (20-40 MG TOTAL) BY MOUTH DAILY., Disp: 180 tablet, Rfl: 3    glucagon  (BAQSIMI  TWO PACK) 3 mg/dose nasal powder, 1 actuation (3 mg total) by Left Nare route every fifteen (15) minutes as needed., Disp: 3 each, Rfl: 1    glucose blood test strip, Use as instructed, Disp: 100 each, Rfl: 12    guaiFENesin -codeine  100-10 mg/5 mL solution, Take 5-10 mLs by mouth at bedtime as needed for Cough. Max Daily Amount: 10 mLs, Disp: 200 mL, Rfl: 0    insulin  aspart, NovoLOG , (NOVOLOG  FLEXPEN) 100 units/mL injection pen, INJECT 0.02-0.08 MLS (2-8 UNITS TOTAL) UNDER THE SKIN THREE (3) TIMES DAILY BEFORE MEALS., Disp: 30 mL, Rfl: 4    insulin  glargine (BASAGLAR  KWIKPEN) 100 units/mL injection pen, USE 6 UNITS DAILY. WASTE 2 UNITS TO PRIME THE NEEDLE., Disp: 15 mL, Rfl: 9    insulin  glargine (BASAGLAR  KWIKPEN) 100 units/mL injection pen, Use 6 units daily. Waste 2 units to prime the needle, Disp: 9 mL, Rfl: 9    insulin  pen needle (B-D UF NANO PEN NEEDLES) 32G X 4 mm, Check BS 6 times daily.  DX Type 1 DM on insulin  [E10.1, Z79.4], Disp: 600 each, Rfl: 9    meclizine  25 mg tablet, Take 1 tablet (25 mg total) by mouth three (3) times daily as needed for Dizziness., Disp: 30 tablet, Rfl: 1    ONETOUCH ULTRA test strip, USE 1 STRIP WITH DIABETES TESTING DEVICE TODOS LOS DIAS SEGUN LO INDICADO, Disp: 100 strip, Rfl: 0    PROLENSA 0.07 % SOLN, , Disp: , Rfl:     sildenafil  100 mg tablet, Take 0.5-1 tablets (50-100 mg total) by mouth daily as needed for Erectile Dysfunction., Disp: 10 tablet, Rfl: 5    simvastatin  20 mg tablet, Take 1 tablet (20 mg total) by mouth at bedtime., Disp: 90 tablet, Rfl: 3    Health Maintenance   Topic Date Due    Hepatitis B Screening  Never done    Advance Directive  Never done    RSV Vaccine (1 - 1-dose 75+ series) Never done    Diabetes: Eye Exam  11/19/2021    Diabetes: HGB A1C  08/17/2023    Preventive Wellness Visit  09/11/2023    COVID-19 Vaccine(Tracks primary and booster doses, not sup/immunocomp) (7 - 2025-26 season) 09/20/2023    Diabetes: URINE ALBUMIN-CREATININE RATIO MONITORING  02/15/2024    Diabetes: eGFR MONITORING  02/19/2024    Tdap/Td Vaccine (3 - Td or Tdap) 11/13/2029  Influenza Vaccine  Completed    Pneumococcal Vaccine  Completed    Hepatitis C Screening  Completed    Shingles (Shingrix) Vaccine  Completed    Statin prescribed for ASCVD Prevention or Treatment  Completed    Colorectal Cancer Screening  Discontinued       Patient Care Team:  Farrell Glendia HERO., DO as PCP - General (Family Medicine)      Subjective:      Chad Watkins is a 78 y.o. male.    Review of Systems   Constitutional: Negative.  Negative for activity change, appetite change, chills, diaphoresis, fatigue, fever and unexpected weight change.   HENT:  Positive for ear pain. Negative for congestion, dental problem, drooling, ear discharge, facial swelling, hearing loss, mouth sores, nosebleeds, postnasal drip, rhinorrhea, sinus pressure, sinus pain, sneezing, sore throat, tinnitus, trouble swallowing and voice change.    Eyes: Negative.  Negative for photophobia, pain, discharge, redness, itching and visual disturbance.   Respiratory: Negative.  Negative for apnea, cough, choking, chest tightness, shortness of breath, wheezing and stridor.    Cardiovascular: Negative.  Negative for chest pain, palpitations and leg swelling.   Gastrointestinal: Negative.  Negative for abdominal distention, abdominal pain, anal bleeding, blood in stool, constipation, diarrhea, nausea, rectal pain and vomiting.   Endocrine: Negative.  Negative for cold intolerance, heat intolerance, polydipsia, polyphagia and polyuria.   Genitourinary: Negative.  Negative for decreased urine volume, difficulty urinating, dysuria, enuresis, flank pain, frequency, genital sores, hematuria, penile discharge, penile pain, penile swelling, scrotal swelling, testicular pain and urgency.   Musculoskeletal:  Positive for neck pain and neck stiffness. Negative for arthralgias, back pain, gait problem, joint swelling and myalgias.   Skin: Negative.  Negative for color change, pallor, rash and wound.   Allergic/Immunologic: Negative.  Negative for environmental allergies, food allergies and immunocompromised state.   Neurological:  Positive for headaches. Negative for dizziness, tremors, seizures, syncope, facial asymmetry, speech difficulty, weakness, light-headedness and numbness.   Hematological: Negative.  Negative for adenopathy. Does not bruise/bleed easily.   Psychiatric/Behavioral: Negative.  Negative for agitation, behavioral problems, confusion, decreased concentration, dysphoric mood, hallucinations, self-injury, sleep disturbance and suicidal ideas. The patient is not nervous/anxious and is not hyperactive.    All other systems reviewed and are negative.        Objective:      Physical Exam  Vitals reviewed.   Constitutional:       General: He is not in acute distress.     Appearance: Normal appearance. He is well-developed and normal weight. He is not ill-appearing, toxic-appearing or diaphoretic.      Comments: BP 143/57  ~ Pulse 84  ~ Wt 149 lb 8 oz (67.8 kg)  ~ SpO2 95%  ~ BMI 23.42 kg/m?          HENT:      Head: Normocephalic and atraumatic.      Right Ear: Tympanic membrane, ear canal and external ear normal. There is no impacted cerumen.      Left Ear: Tympanic membrane, ear canal and external ear normal. There is no impacted cerumen.      Nose: Nose normal. No congestion or rhinorrhea.      Mouth/Throat:      Mouth: Mucous membranes are moist.      Pharynx: Oropharynx is clear. No oropharyngeal exudate or posterior oropharyngeal erythema.   Eyes:      General:         Right eye: No discharge.  Left eye: No discharge.      Conjunctiva/sclera: Conjunctivae normal.   Pulmonary:      Effort: Pulmonary effort is normal. No respiratory distress.      Breath sounds: Normal breath sounds. No stridor. No wheezing, rhonchi or rales.   Chest:      Chest wall: No tenderness.   Musculoskeletal:      Cervical back: Normal range of motion and neck supple. No rigidity. No muscular tenderness.   Lymphadenopathy:      Cervical: No cervical adenopathy.   Skin:     General: Skin is warm and dry.   Neurological:      General: No focal deficit present.      Mental Status: He is alert and oriented to person, place, and time.   Psychiatric:         Mood and Affect: Mood normal.         Behavior: Behavior normal.         Thought Content: Thought content normal.         Judgment: Judgment normal.             ASSESSMENT AND PLAN     1. Otalgia, unspecified laterality    2. Hypertension associated with diabetes (HCC/RAF)    3. DM type 1 with diabetic mixed hyperlipidemia (HCC/RAF)    4. Peripheral vascular disease    5. Aortic atherosclerosis    6. Healthcare maintenance    7. Diabetic polyneuropathy associated with type 1 diabetes mellitus (HCC/RAF)    8. Bruxism (teeth grinding)    9. PND (post-nasal drip)      Believe to be combo of PND and teeth grinding causing sx's.  Tylenol , claritin, flonase, Breo.  See dentist also about new dentures that were put in just prior to sx's starting.      Orders Placed This Encounter    Albumin/Creat Ratio Ur    CBC & Auto Differential    Hgb A1c    Folate,Serum    Lipid Panel    Vitamin D,25-Hydroxy    Vitamin B12    Urinalysis w/Reflex to Culture    Magnesium    Uric Acid    Comprehensive Metabolic Panel    PSA,Free & Total Profile    TSH    Free T4    Testosterone Profile (Free + Total + Bioavailable + SHBG)(Males > 14 Years)    PTH, Intact       Wt Readings from Last 5 Encounters:   11/26/23 149 lb 8 oz (67.8 kg)   07/20/23 147 lb 6.4 oz (66.9 kg)   02/25/23 139 lb (63 kg)   02/15/23 140 lb (63.5 kg)   11/12/22 145 lb 6.4 oz (66 kg)     BP Readings from Last 5 Encounters:   11/26/23 143/57   07/20/23 119/54   03/01/23 93/60   02/25/23 115/67   02/15/23 109/66       Hgb A1c   Date Value Ref Range Status   02/15/2023 10.0 (H) <5.7 % Final     Comment:     For patients with diabetes, an A1c less than (<) or equal (=) to 7.0% is recommended for most patients, however the goal may be higher or lower depending on age and/or other medical problems.     For a diagnosis of diabetes, A1c greater than (>) or equal(=) to 6.5% indicates diabetes; values between 5.7% and 6.4% may indicate an increased risk of developing diabetes.  Hemoglobin A1c is determined with Roche Cobas immunoassay. The presence of abnormal hemoglobin variants, Thalassemia, and increased concentration of HbF (>7%), may result in lower HbA1c values.   09/05/2022 8.9 (H) <5.7 % Final     Comment:     For patients with diabetes, an A1c less than (<) or equal (=) to 7.0% is recommended for most patients, however the goal may be higher or lower depending on age and/or other medical problems.     For a diagnosis of diabetes, A1c greater than (>) or equal(=) to 6.5% indicates diabetes; values between 5.7% and 6.4% may indicate an increased risk of developing diabetes.    Hemoglobin A1c is determined with Roche Cobas immunoassay. The presence of abnormal hemoglobin variants, Thalassemia, and increased concentration of HbF (>7%), may result in lower HbA1c values.   08/17/2021 8.2 (H) <5.7 % Final     Comment:     For patients with diabetes, an A1c less than (<) or equal (=) to 7.0% is recommended for most patients, however the goal may be higher or lower depending on age and/or other medical problems.   For a diagnosis of diabetes, A1c greater than (>) or equal(=) to 6.5% indicates diabetes; values between 5.7% and 6.4% may indicate an increased risk of developing diabetes.     Hemoglobin A1C, Manual   Date Value Ref Range Status   05/12/2022 8.1 (Ref Range:4.2-6.5%) Note: For patients with diabetes, an A1c less than (<) or equal (=) to 7.0% is recommended for most patients, however the goal may be higher or lower depending on age and/or other medical problems. % Final     Cholesterol   Date Value Ref Range Status   02/15/2023 166 See Comment mg/dL Final     Comment:     The significance of total cholesterol depends on the values of LDL, HDL, triglycerides and the clinical context. A patient-provider discussion may be considered.       09/05/2022 186 See Comment mg/dL Final     Comment:     The significance of total cholesterol depends on the values of LDL, HDL, triglycerides and the clinical context. A patient-provider discussion may be considered.       08/17/2021 173 See Comment mg/dL Final     Comment:     The significance of total cholesterol depends on the values of LDL, HDL, triglycerides and the clinical context. A patient-provider discussion may be considered.         Cholesterol,LDL,Calc   Date Value Ref Range Status   02/15/2023 49 <100 mg/dL Final     Comment:     Patient is non-fasting, interpret with caution.  If LDL value falls outside of the designated range AND if  included in any of the following categories, a  patient-provider discussion is recommended.     Statin therapy is recommended for individuals:  1. with clinical atherosclerotic cardiovascular disease     irrespective of LDL levels;  2. with LDL > or = 190 mg/dL;  3. with diabetes, aged 40-75 years, with LDL between 70 and     189 mg/dL;  4. without any of the above but who have LDL between 70 and     189 mg/dL and an estimated 89-bzjm risk of     atherosclerotic cardiovascular disease > or = 7.5%     (consider statin therapy if estimated 10-year risk > or =     5.0%) (ACC/AHA 2013 Guidelines).   09/05/2022 57 <100 mg/dL Final  Comment:     Patient is non-fasting, interpret with caution.  If LDL value falls outside of the designated range AND if  included in any of the following categories, a  patient-provider discussion is recommended.     Statin therapy is recommended for individuals:  1. with clinical atherosclerotic cardiovascular disease     irrespective of LDL levels;  2. with LDL > or = 190 mg/dL;  3. with diabetes, aged 40-75 years, with LDL between 70 and     189 mg/dL;  4. without any of the above but who have LDL between 70 and     189 mg/dL and an estimated 89-bzjm risk of     atherosclerotic cardiovascular disease > or = 7.5% (consider statin therapy if estimated 10-year risk > or =     5.0%) (ACC/AHA 2013 Guidelines).   08/17/2021 55 <100 mg/dL Final     Comment:     Patient is non-fasting, interpret with caution.  If LDL value falls outside of the designated range AND if  included in any of the following categories, a  patient-provider discussion is recommended.     Statin therapy is recommended for individuals:  1. with clinical atherosclerotic cardiovascular disease     irrespective of LDL levels;  2. with LDL > or = 190 mg/dL;  3. with diabetes, aged 40-75 years, with LDL between 70 and     189 mg/dL;  4. without any of the above but who have LDL between 70 and     189 mg/dL and an estimated 89-bzjm risk of     atherosclerotic cardiovascular disease > or = 7.5%     (consider statin therapy if estimated 10-year risk > or =     5.0%) (ACC/AHA 2013 Guidelines).     Cholesterol, HDL   Date Value Ref Range Status   02/15/2023 98 >40 mg/dL Final     Comment:     If HDL cholesterol level falls outside of the designated  range, a patient-provider discussion is recommended   09/05/2022 119 >40 mg/dL Final     Comment:     If HDL cholesterol level falls outside of the designated  range, a patient-provider discussion is recommended   08/17/2021 109 >40 mg/dL Final     Comment:     If HDL cholesterol level falls outside of the designated  range, a patient-provider discussion is recommended     Triglycerides   Date Value Ref Range Status   02/15/2023 93 <150 mg/dL Final     Comment:     Patient is non-fasting, interpret with caution.  If Triglyceride level falls outside of the designated range,  a patient-provider discussion is recommended.     09/05/2022 49 <150 mg/dL Final     Comment:     Patient is non-fasting, interpret with caution.  If Triglyceride level falls outside of the designated range,  a patient-provider discussion is recommended.     08/17/2021 43 <150 mg/dL Final     Comment:     Patient is non-fasting, interpret with caution.  If Triglyceride level falls outside of the designated range,  a patient-provider discussion is recommended.        Creatinine   Date Value Ref Range Status   02/15/2023 1.20 0.60 - 1.30 mg/dL Final   91/82/7975 8.90 0.60 - 1.30 mg/dL Final   92/69/7976 9.15 0.60 - 1.30 mg/dL Final        PHQ-9 Results      09/11/2015  10:53 AM 12/24/2016    10:44 AM 01/22/2017    11:45 AM 01/27/2018     9:06 AM 09/19/2019     9:58 AM 09/11/2022    12:00 PM 09/11/2022    12:30 PM   Depression Screening (Patient Health Questionnaire PHQ)   PHQ-2: Feeling down, depressed, or hopeless No  No  No  No  No   Not at all   PHQ-2: Little interest or pleassure in doing things No  No  No  No  No   Not at all   Little interest or pleasure in doing things      Not at all    Feeling down, depressed, or hopeless      Not at all    Trouble falling or staying asleep, or sleeping too much      Several days    Feeling tired or having little energy      Not at all    Poor appetite or overeating      Not at all    Feeling bad about yourself - or that you are a failure or have let yourself or your family down      Not at all    Trouble concentrating on things, such as reading the newspaper or watching television      Not at all    Moving or speaking so slowly Or being so fidgety or restless      Not at all    Thoughts that you would be better off dead, or of hurting yourself in some way      Not at all    Total Score      1        Data saved with a previous flowsheet row definition       GAD-7 Results      09/11/2022    12:00 PM   GAD-7   Feeling nervous, anxious, or on edge Several days   Not being able to stop or control worrying Several days   Worrying too much about different things Not at all   Trouble relaxing Several days   Being so restless that is hard to sit still Several days   Becoming easily annoyed or irritable Not at all   Feeling afraid, as if something awful might happen Not at all   Total Score 4       DAST Results       No data to display                Audit-C results       No data to display                  No follow-ups on file.  The above plan of care, diagnosis, orders, and follow-up were discussed with the patient.  Questions related to this recommended plan of care were answered.  Glendia HERO. Farrell, DO  11/26/2023    Author:  Glendia HERO. Tailey Top 11/26/2023 6:37 PM

## 2024-01-05 MED ORDER — AMLODIPINE BESY-BENAZEPRIL HCL 5-20 MG PO CAPS
1 | ORAL_CAPSULE | Freq: Two times a day (BID) | ORAL | 3 refills
Start: 2024-01-05 — End: ?

## 2024-01-05 MED ORDER — SIMVASTATIN 20 MG PO TABS
ORAL_TABLET | 3 refills
Start: 2024-01-05 — End: ?

## 2024-01-07 MED ORDER — SIMVASTATIN 20 MG PO TABS
ORAL_TABLET | 3 refills | Status: AC
Start: 2024-01-07 — End: ?

## 2024-01-07 MED ORDER — AMLODIPINE BESY-BENAZEPRIL HCL 5-20 MG PO CAPS
1 | ORAL_CAPSULE | Freq: Two times a day (BID) | ORAL | 3 refills | Status: AC
Start: 2024-01-07 — End: ?

## 2024-01-07 MED ORDER — FUROSEMIDE 20 MG PO TABS
20 mg | ORAL_TABLET | Freq: Every day | ORAL | 3 refills | Status: AC
Start: 2024-01-07 — End: ?

## 2024-01-17 ENCOUNTER — Ambulatory Visit: Payer: BLUE CROSS/BLUE SHIELD

## 2024-01-18 ENCOUNTER — Ambulatory Visit: Payer: BLUE CROSS/BLUE SHIELD

## 2024-02-04 ENCOUNTER — Other Ambulatory Visit: Payer: BLUE CROSS/BLUE SHIELD

## 2024-02-08 ENCOUNTER — Ambulatory Visit: Payer: BLUE CROSS/BLUE SHIELD
